# Patient Record
Sex: Female | Born: 1940
Health system: Southern US, Community
[De-identification: ages and names within clinical notes are randomized; demographics above are authoritative.]

## PROBLEM LIST (undated history)

## (undated) DIAGNOSIS — E11319 Type 2 diabetes mellitus with unspecified diabetic retinopathy without macular edema: Secondary | ICD-10-CM

## (undated) DIAGNOSIS — M8580 Other specified disorders of bone density and structure, unspecified site: Secondary | ICD-10-CM

## (undated) DIAGNOSIS — D649 Anemia, unspecified: Secondary | ICD-10-CM

## (undated) DIAGNOSIS — E1129 Type 2 diabetes mellitus with other diabetic kidney complication: Principal | ICD-10-CM

## (undated) DIAGNOSIS — R809 Proteinuria, unspecified: Principal | ICD-10-CM

## (undated) DIAGNOSIS — K219 Gastro-esophageal reflux disease without esophagitis: Secondary | ICD-10-CM

## (undated) DIAGNOSIS — E039 Hypothyroidism, unspecified: Secondary | ICD-10-CM

## (undated) HISTORY — DX: Type 2 diabetes mellitus with unspecified diabetic retinopathy without macular edema: E11.319

## (undated) HISTORY — DX: Anemia, unspecified: D64.9

## (undated) HISTORY — DX: Proteinuria, unspecified: R80.9

## (undated) HISTORY — DX: Other specified disorders of bone density and structure, unspecified site: M85.80

## (undated) HISTORY — DX: Type 2 diabetes mellitus with other diabetic kidney complication: E11.29

## (undated) HISTORY — DX: Gastro-esophageal reflux disease without esophagitis: K21.9

## (undated) HISTORY — PX: TONSILLECTOMY: SUR1361

---

## 1998-05-04 ENCOUNTER — Ambulatory Visit (HOSPITAL_COMMUNITY): Admission: RE | Admit: 1998-05-04 | Discharge: 1998-05-04 | Payer: Self-pay | Admitting: Geriatric Medicine

## 2007-02-05 ENCOUNTER — Other Ambulatory Visit: Admission: RE | Admit: 2007-02-05 | Discharge: 2007-02-05 | Payer: Self-pay | Admitting: Internal Medicine

## 2010-03-15 ENCOUNTER — Other Ambulatory Visit
Admission: RE | Admit: 2010-03-15 | Discharge: 2010-03-15 | Payer: Self-pay | Source: Home / Self Care | Admitting: Geriatric Medicine

## 2010-03-15 ENCOUNTER — Other Ambulatory Visit: Payer: Self-pay | Admitting: Geriatric Medicine

## 2010-07-07 ENCOUNTER — Emergency Department (HOSPITAL_COMMUNITY)
Admission: EM | Admit: 2010-07-07 | Discharge: 2010-07-07 | Discharge status: Against Medical Advice | Payer: Medicare PPO | Attending: Emergency Medicine | Admitting: Emergency Medicine

## 2010-07-07 DIAGNOSIS — R1013 Epigastric pain: Secondary | ICD-10-CM | POA: Insufficient documentation

## 2010-07-07 DIAGNOSIS — R112 Nausea with vomiting, unspecified: Secondary | ICD-10-CM | POA: Insufficient documentation

## 2010-07-07 DIAGNOSIS — Z0389 Encounter for observation for other suspected diseases and conditions ruled out: Secondary | ICD-10-CM | POA: Insufficient documentation

## 2010-07-23 DEATH — deceased

## 2010-08-19 ENCOUNTER — Ambulatory Visit (HOSPITAL_COMMUNITY): Admission: RE | Admit: 2010-08-19 | Payer: Self-pay | Source: Ambulatory Visit | Admitting: Gastroenterology

## 2010-08-19 ENCOUNTER — Ambulatory Visit (HOSPITAL_COMMUNITY)
Admission: RE | Admit: 2010-08-19 | Discharge: 2010-08-19 | Disposition: A | Discharge status: Home / Self Care | Payer: Medicare PPO | Source: Ambulatory Visit | Attending: Gastroenterology | Admitting: Gastroenterology

## 2010-08-19 DIAGNOSIS — K5730 Diverticulosis of large intestine without perforation or abscess without bleeding: Secondary | ICD-10-CM | POA: Insufficient documentation

## 2010-08-19 DIAGNOSIS — Z1211 Encounter for screening for malignant neoplasm of colon: Secondary | ICD-10-CM | POA: Insufficient documentation

## 2010-08-19 DIAGNOSIS — K6389 Other specified diseases of intestine: Secondary | ICD-10-CM | POA: Insufficient documentation

## 2010-09-02 NOTE — Op Note (Signed)
  NAME:  Veronica [name], Veronica [name]           ACCOUNT NO.:  [id number]  MEDICAL RECORD NO.:  [id number]  LOCATION:  WLEN                         FACILITY:  [facility]  PHYSICIAN:  Danise Edge, M.D.   DATE OF BIRTH:  [birth year]  DATE OF PROCEDURE:  08/19/2010 DATE OF DISCHARGE:                              OPERATIVE REPORT   PROCEDURE:  Baseline screening colonoscopy.  REFERRING PHYSICIAN:  Hal T. Stoneking, M.D.  HISTORY:  Veronica [name] is a 70 year old female born [birth year].  The patient is scheduled to undergo her first screening colonoscopy with polypectomy to prevent colon cancer.  ENDOSCOPIST:  Danise Edge, M.D.  PREMEDICATIONS:  Fentanyl 100 mcg, Versed 10 mg, Benadryl 50 mg.  DESCRIPTION OF PROCEDURE:  The patient was placed in the left lateral decubitus position.  Anal inspection and digital rectal exam were normal.  The Pentax pediatric colonoscope was introduced into the rectum and easily advanced to the cecum.  A normal-appearing ileocecal valve and appendiceal orifice were identified.  Colonic preparation for the exam today was good.  Rectum normal.  Retroflex view of the distal rectum normal.  Sigmoid colon.  Scattered small diverticula.  Descending colon normal.  Splenic flexure normal.  Transverse colon normal.  Hepatic flexure normal.  Ascending colon normal.  Cecum and ileocecal valve normal.  ASSESSMENT: 1. Sigmoid colonic diverticulosis. 2. Melanosis coli. 3. No endoscopic evidence for the presence of colorectal neoplasia.  RECOMMENDATIONS:  Consider repeat screening colonoscopy in 10 years if the patient's health allows.          ______________________________ Danise Edge, M.D.     MJ/MEDQ  D:  08/19/2010  T:  08/19/2010  Job:  [id number]  cc:   Hal T. Stoneking, M.D. Fax: [phone]  Electronically Signed by Danise Edge M.D. on 09/02/2010 03:56:05 PM

## 2011-02-28 HISTORY — PX: APPENDECTOMY: SHX54

## 2011-03-02 ENCOUNTER — Encounter (HOSPITAL_COMMUNITY): Payer: Self-pay | Admitting: *Deleted

## 2011-03-02 ENCOUNTER — Ambulatory Visit (HOSPITAL_COMMUNITY)
Admission: EM | Admit: 2011-03-02 | Discharge: 2011-03-04 | Disposition: A | Discharge status: Home / Self Care | Payer: Medicare HMO | Attending: Surgery | Admitting: Surgery

## 2011-03-02 ENCOUNTER — Emergency Department (HOSPITAL_COMMUNITY): Payer: Medicare HMO

## 2011-03-02 DIAGNOSIS — E039 Hypothyroidism, unspecified: Secondary | ICD-10-CM | POA: Insufficient documentation

## 2011-03-02 DIAGNOSIS — I700 Atherosclerosis of aorta: Secondary | ICD-10-CM | POA: Insufficient documentation

## 2011-03-02 DIAGNOSIS — R1031 Right lower quadrant pain: Secondary | ICD-10-CM | POA: Insufficient documentation

## 2011-03-02 DIAGNOSIS — E278 Other specified disorders of adrenal gland: Secondary | ICD-10-CM | POA: Insufficient documentation

## 2011-03-02 DIAGNOSIS — K5730 Diverticulosis of large intestine without perforation or abscess without bleeding: Secondary | ICD-10-CM | POA: Insufficient documentation

## 2011-03-02 DIAGNOSIS — Z79899 Other long term (current) drug therapy: Secondary | ICD-10-CM | POA: Insufficient documentation

## 2011-03-02 DIAGNOSIS — I079 Rheumatic tricuspid valve disease, unspecified: Secondary | ICD-10-CM | POA: Insufficient documentation

## 2011-03-02 DIAGNOSIS — K3580 Unspecified acute appendicitis: Secondary | ICD-10-CM | POA: Insufficient documentation

## 2011-03-02 DIAGNOSIS — Q619 Cystic kidney disease, unspecified: Secondary | ICD-10-CM | POA: Insufficient documentation

## 2011-03-02 DIAGNOSIS — R112 Nausea with vomiting, unspecified: Secondary | ICD-10-CM | POA: Insufficient documentation

## 2011-03-02 DIAGNOSIS — R9431 Abnormal electrocardiogram [ECG] [EKG]: Secondary | ICD-10-CM | POA: Insufficient documentation

## 2011-03-02 HISTORY — DX: Hypothyroidism, unspecified: E03.9

## 2011-03-02 LAB — DIFFERENTIAL
Basophils Absolute: 0 10*3/uL (ref 0.0–0.1)
Basophils Relative: 0 % (ref 0–1)
Eosinophils Absolute: 0 10*3/uL (ref 0.0–0.7)
Eosinophils Relative: 0 % (ref 0–5)
Lymphocytes Relative: 4 % — ABNORMAL LOW (ref 12–46)
Lymphs Abs: 0.5 10*3/uL — ABNORMAL LOW (ref 0.7–4.0)
Monocytes Absolute: 0.4 10*3/uL (ref 0.1–1.0)
Monocytes Relative: 4 % (ref 3–12)
Neutro Abs: 10.5 10*3/uL — ABNORMAL HIGH (ref 1.7–7.7)
Neutrophils Relative %: 92 % — ABNORMAL HIGH (ref 43–77)

## 2011-03-02 LAB — CBC
HCT: 30.4 % — ABNORMAL LOW (ref 36.0–46.0)
MCH: 29.7 pg (ref 26.0–34.0)
MCV: 86.9 fL (ref 78.0–100.0)
Platelets: 201 10*3/uL (ref 150–400)
RBC: 3.5 MIL/uL — ABNORMAL LOW (ref 3.87–5.11)
RDW: 12.8 % (ref 11.5–15.5)
WBC: 11.4 10*3/uL — ABNORMAL HIGH (ref 4.0–10.5)

## 2011-03-02 LAB — COMPREHENSIVE METABOLIC PANEL
CO2: 25 mEq/L (ref 19–32)
Calcium: 9.7 mg/dL (ref 8.4–10.5)
Creatinine, Ser: 0.71 mg/dL (ref 0.50–1.10)
GFR calc Af Amer: >90 mL/min (ref >90)
GFR calc non Af Amer: 84 mL/min — ABNORMAL LOW (ref >90)
Glucose, Bld: 145 mg/dL — ABNORMAL HIGH (ref 70–99)
Sodium: 134 mEq/L — ABNORMAL LOW (ref 135–145)
Total Protein: 6.7 g/dL (ref 6.0–8.3)

## 2011-03-02 LAB — LIPASE, BLOOD: Lipase: 18 U/L (ref 11–59)

## 2011-03-02 LAB — CARDIAC PANEL(CRET KIN+CKTOT+MB+TROPI)
CK, MB: 2.2 ng/mL (ref 0.3–4.0)
Relative Index: INVALID (ref 0.0–2.5)
Total CK: 72 U/L (ref 7–177)
Troponin I: <0.3 ng/mL

## 2011-03-02 MED ORDER — HYDROMORPHONE HCL PF 1 MG/ML IJ SOLN
1.0000 mg | Freq: Once | INTRAMUSCULAR | Status: AC
  Administered 2011-03-02: 1 mg via INTRAVENOUS
  Filled 2011-03-02: qty 1

## 2011-03-02 MED ORDER — SODIUM CHLORIDE 0.9 % IV SOLN
1.0000 g | Freq: Once | INTRAVENOUS | Status: AC
  Administered 2011-03-03: 1 g via INTRAVENOUS
  Filled 2011-03-02: qty 1

## 2011-03-02 MED ORDER — SODIUM CHLORIDE 0.9 % IV SOLN
Freq: Once | INTRAVENOUS | Status: AC
  Administered 2011-03-03: via INTRAVENOUS

## 2011-03-02 MED ORDER — IOHEXOL 300 MG/ML  SOLN
100.0000 mL | Freq: Once | INTRAMUSCULAR | Status: AC | PRN
  Administered 2011-03-02: 80 mL via INTRAVENOUS

## 2011-03-02 MED ORDER — SODIUM CHLORIDE 0.9 % IV BOLUS (SEPSIS)
1000.0000 mL | Freq: Once | INTRAVENOUS | Status: AC
  Administered 2011-03-02: 1000 mL via INTRAVENOUS

## 2011-03-02 MED ORDER — BUPIVACAINE-EPINEPHRINE PF 0.25-1:200000 % IJ SOLN
INTRAMUSCULAR | Status: AC
  Filled 2011-03-02: qty 30

## 2011-03-02 MED ORDER — ONDANSETRON HCL 4 MG/2ML IJ SOLN
4.0000 mg | Freq: Once | INTRAMUSCULAR | Status: AC
  Administered 2011-03-02: 4 mg via INTRAVENOUS
  Filled 2011-03-02: qty 2

## 2011-03-02 NOTE — ED Notes (Signed)
Pt reports RUQ pain that radiates down to RLQ and to LUQ. Pt also w/ multiple episodes of n/v today as well. Denies fever or diarrhea.

## 2011-03-02 NOTE — ED Provider Notes (Signed)
History     CSN: [id number]  Arrival date & time 03/02/11  5621   First MD Initiated Contact with Patient 03/02/11 2101      Chief Complaint  Patient presents with  . Abdominal Pain  . Nausea  . Emesis   Patient presents with right-sided abdominal pain. Apparently, had multiple episodes of nausea and vomiting. Today the pain began around 12 noon. The patient initially thought that she may have a "stomach flu". However, now she states the pain is localizing to the right side of the abdomen. The patient was concerned for the possibility of gallbladder disease or possibly appendicitis. She's had no fevers, no diarrhea. Denies any urinary symptoms. She did try Pepto-Bismol without any relief. Patient states she was unable to keep anything down at home. (Consider location/radiation/quality/duration/timing/severity/associated sxs/prior treatment) HPI  Past Medical History  Diagnosis Date  . Hypothyroid     Past Surgical History  Procedure Date  . Cesarean section   . Tonsillectomy     No family history on file.  History  Substance Use Topics  . Smoking status: Never Smoker   . Smokeless tobacco: Not on file  . Alcohol Use: No    OB History    Grav Para Term Preterm Abortions TAB SAB Ect Mult Living                  Review of Systems  All other systems reviewed and are negative.    Allergies  Codeine and Sulfa antibiotics  Home Medications   Current Outpatient Rx  Name Route Sig Dispense Refill  . CITALOPRAM HYDROBROMIDE 40 MG PO TABS Oral Take 40 mg by mouth daily.    [name] LEVOTHYROXINE SODIUM 75 MCG PO TABS Oral Take 75 mcg by mouth daily.      BP 151/77  Pulse 76  Temp(Src) 97.7 F (36.5 C) (Oral)  Resp 20  SpO2 100%  Physical Exam  Nursing note and vitals reviewed. Constitutional: She is oriented to person, place, and time. She appears well-developed and well-nourished.  HENT:  Head: Normocephalic and atraumatic.  Eyes: Conjunctivae and EOM are  normal. Pupils are equal, round, and reactive to light.  Neck: Neck supple.  Cardiovascular: Normal rate and regular rhythm.  Exam reveals no gallop and no friction rub.   No murmur heard. Pulmonary/Chest: Breath sounds normal. She has no wheezes. She has no rales. She exhibits no tenderness.  Abdominal: Soft. Bowel sounds are normal. She exhibits no distension. There is tenderness. There is no rebound and no guarding.       Rlq TENDERNESS  Musculoskeletal: Normal range of motion.  Neurological: She is alert and oriented to person, place, and time. No cranial nerve deficit. Coordination normal.  Skin: Skin is warm and dry. No rash noted.  Psychiatric: She has a normal mood and affect.    ED Course  Procedures (including critical care time)  Labs Reviewed - No data to display No results found.   No diagnosis found.    MDM  Pt is seen and examined;  Initial history and physical completed.  Will follow.       IV fluids, labs, pain meds, and CAT scan has been ordered. We'll also paged the surgeon, to see if he would like to see her sooner. Strong suspicion for appendicitis.    9:14 PM  Discussed with Dr. Daphine Deutscher, the general surgeon, who has evaluated the patient in the room. Based on the differential diagnosis a CT scan will be necessary.  Labs have been drawn. IV fluids pain medications and we'll follow closely. Disposition pending. CT scan    Results for orders placed during the hospital encounter of 03/02/11  CARDIAC PANEL(CRET KIN+CKTOT+MB+TROPI)      Component Value Range   Total CK 72  7 - 177 (U/L)   CK, MB 2.2  0.3 - 4.0 (ng/mL)   Troponin I <0.30  <0.30 (ng/mL)   Relative Index RELATIVE INDEX IS INVALID  0.0 - 2.5   CBC      Component Value Range   WBC 11.4 (*) 4.0 - 10.5 (K/uL)   RBC 3.50 (*) 3.87 - 5.11 (MIL/uL)   Hemoglobin 10.4 (*) 12.0 - 15.0 (g/dL)   HCT 40.9 (*) 81.1 - 46.0 (%)   MCV 86.9  78.0 - 100.0 (fL)   MCH 29.7  26.0 - 34.0 (pg)   MCHC 34.2   30.0 - 36.0 (g/dL)   RDW 91.4  78.2 - 95.6 (%)   Platelets 201  150 - 400 (K/uL)  DIFFERENTIAL      Component Value Range   Neutrophils Relative 92 (*) 43 - 77 (%)   Neutro Abs 10.5 (*) 1.7 - 7.7 (K/uL)   Lymphocytes Relative 4 (*) 12 - 46 (%)   Lymphs Abs 0.5 (*) 0.7 - 4.0 (K/uL)   Monocytes Relative 4  3 - 12 (%)   Monocytes Absolute 0.4  0.1 - 1.0 (K/uL)   Eosinophils Relative 0  0 - 5 (%)   Eosinophils Absolute 0.0  0.0 - 0.7 (K/uL)   Basophils Relative 0  0 - 1 (%)   Basophils Absolute 0.0  0.0 - 0.1 (K/uL)  COMPREHENSIVE METABOLIC PANEL      Component Value Range   Sodium 134 (*) 135 - 145 (mEq/L)   Potassium 3.8  3.5 - 5.1 (mEq/L)   Chloride 97  96 - 112 (mEq/L)   CO2 25  19 - 32 (mEq/L)   Glucose, Bld 145 (*) 70 - 99 (mg/dL)   BUN 14  6 - 23 (mg/dL)   Creatinine, Ser 2.13  0.50 - 1.10 (mg/dL)   Calcium 9.7  8.4 - 08.6 (mg/dL)   Total Protein 6.7  6.0 - 8.3 (g/dL)   Albumin 3.8  3.5 - 5.2 (g/dL)   AST 36  0 - 37 (U/L)   ALT 16  0 - 35 (U/L)   Alkaline Phosphatase 70  39 - 117 (U/L)   Total Bilirubin 0.8  0.3 - 1.2 (mg/dL)   GFR calc non Af Amer 84 (*) >90 (mL/min)   GFR calc Af Amer >90  >90 (mL/min)  LIPASE, BLOOD      Component Value Range   Lipase 18  11 - 59 (U/L)   No results found.   10:51 PM  Stable pending CT   11:30 PM  CT reviewed, Dr Daphine Deutscher re-paged.  Will need surgical care.   Matthan Sledge A. Patrica Duel, MD 03/04/11 5784

## 2011-03-03 ENCOUNTER — Encounter (HOSPITAL_COMMUNITY): Payer: Self-pay | Admitting: *Deleted

## 2011-03-03 ENCOUNTER — Other Ambulatory Visit (INDEPENDENT_AMBULATORY_CARE_PROVIDER_SITE_OTHER): Payer: Self-pay | Admitting: Surgery

## 2011-03-03 ENCOUNTER — Other Ambulatory Visit: Payer: Self-pay

## 2011-03-03 ENCOUNTER — Emergency Department (HOSPITAL_COMMUNITY): Payer: Medicare HMO | Admitting: *Deleted

## 2011-03-03 ENCOUNTER — Encounter (HOSPITAL_COMMUNITY)
Admission: EM | Disposition: A | Discharge status: Home / Self Care | Payer: Self-pay | Source: Home / Self Care | Attending: Emergency Medicine

## 2011-03-03 DIAGNOSIS — K3580 Unspecified acute appendicitis: Secondary | ICD-10-CM

## 2011-03-03 DIAGNOSIS — I369 Nonrheumatic tricuspid valve disorder, unspecified: Secondary | ICD-10-CM

## 2011-03-03 HISTORY — PX: LAPAROSCOPIC APPENDECTOMY: SHX408

## 2011-03-03 LAB — CBC
HCT: 27.9 % — ABNORMAL LOW (ref 36.0–46.0)
Hemoglobin: 9.5 g/dL — ABNORMAL LOW (ref 12.0–15.0)
MCH: 29.8 pg (ref 26.0–34.0)
MCHC: 34.1 g/dL (ref 30.0–36.0)
MCV: 87.5 fL (ref 78.0–100.0)

## 2011-03-03 LAB — CARDIAC PANEL(CRET KIN+CKTOT+MB+TROPI)
CK, MB: 2.3 ng/mL (ref 0.3–4.0)
Total CK: 67 U/L (ref 7–177)
Troponin I: <0.3 ng/mL (ref <0.30)

## 2011-03-03 LAB — CREATININE, SERUM
Creatinine, Ser: 0.79 mg/dL (ref 0.50–1.10)
GFR calc non Af Amer: 81 mL/min — ABNORMAL LOW (ref >90)

## 2011-03-03 LAB — LACTIC ACID, PLASMA: Lactic Acid, Venous: 1.1 mmol/L (ref 0.5–2.2)

## 2011-03-03 SURGERY — APPENDECTOMY, LAPAROSCOPIC
Anesthesia: General | Site: Abdomen | Wound class: Contaminated

## 2011-03-03 MED ORDER — ONDANSETRON HCL 4 MG/2ML IJ SOLN
INTRAMUSCULAR | Status: DC | PRN
  Administered 2011-03-03: 4 mg via INTRAVENOUS

## 2011-03-03 MED ORDER — HETASTARCH-ELECTROLYTES 6 % IV SOLN
INTRAVENOUS | Status: DC | PRN
  Administered 2011-03-03: 02:00:00 via INTRAVENOUS

## 2011-03-03 MED ORDER — HYDROMORPHONE HCL PF 1 MG/ML IJ SOLN
INTRAMUSCULAR | Status: AC
  Filled 2011-03-03: qty 1

## 2011-03-03 MED ORDER — GLYCOPYRROLATE 0.2 MG/ML IJ SOLN
INTRAMUSCULAR | Status: DC | PRN
  Administered 2011-03-03: .4 mg via INTRAVENOUS

## 2011-03-03 MED ORDER — EPHEDRINE SULFATE 50 MG/ML IJ SOLN
INTRAMUSCULAR | Status: DC | PRN
  Administered 2011-03-03: 10 mg via INTRAVENOUS
  Administered 2011-03-03: 5 mg via INTRAVENOUS

## 2011-03-03 MED ORDER — SUCCINYLCHOLINE CHLORIDE 20 MG/ML IJ SOLN
INTRAMUSCULAR | Status: DC | PRN
  Administered 2011-03-03: 100 mg via INTRAVENOUS

## 2011-03-03 MED ORDER — PHENYLEPHRINE HCL 10 MG/ML IJ SOLN
INTRAMUSCULAR | Status: DC | PRN
  Administered 2011-03-03: 40 ug via INTRAVENOUS

## 2011-03-03 MED ORDER — LIDOCAINE HCL (CARDIAC) 20 MG/ML IV SOLN
INTRAVENOUS | Status: DC | PRN
  Administered 2011-03-03: 100 mg via INTRAVENOUS

## 2011-03-03 MED ORDER — DEXAMETHASONE SODIUM PHOSPHATE 10 MG/ML IJ SOLN
INTRAMUSCULAR | Status: DC | PRN
  Administered 2011-03-03: 10 mg via INTRAVENOUS

## 2011-03-03 MED ORDER — MORPHINE SULFATE 2 MG/ML IJ SOLN
1.0000 mg | INTRAMUSCULAR | Status: DC | PRN
  Administered 2011-03-03: 2 mg via INTRAVENOUS
  Filled 2011-03-03 (×2): qty 1

## 2011-03-03 MED ORDER — ROCURONIUM BROMIDE 100 MG/10ML IV SOLN
INTRAVENOUS | Status: DC | PRN
  Administered 2011-03-03: 20 mg via INTRAVENOUS

## 2011-03-03 MED ORDER — FENTANYL CITRATE 0.05 MG/ML IJ SOLN
INTRAMUSCULAR | Status: DC | PRN
  Administered 2011-03-03 (×3): 50 ug via INTRAVENOUS
  Administered 2011-03-03: 100 ug via INTRAVENOUS

## 2011-03-03 MED ORDER — LEVOTHYROXINE SODIUM 75 MCG PO TABS
75.0000 ug | ORAL_TABLET | Freq: Every day | ORAL | Status: DC
  Administered 2011-03-03 – 2011-03-04 (×3): 75 ug via ORAL
  Filled 2011-03-03 (×3): qty 1

## 2011-03-03 MED ORDER — LACTATED RINGERS IV SOLN
INTRAVENOUS | Status: DC | PRN
  Administered 2011-03-03: 03:00:00
  Administered 2011-03-03: 01:00:00 via INTRAVENOUS

## 2011-03-03 MED ORDER — SODIUM CHLORIDE 0.9 % IV SOLN: Freq: Once | INTRAVENOUS | Status: DC

## 2011-03-03 MED ORDER — LACTATED RINGERS IR SOLN
Status: DC | PRN
  Administered 2011-03-03: 1000 mL

## 2011-03-03 MED ORDER — ESMOLOL HCL 10 MG/ML IV SOLN
INTRAVENOUS | Status: DC | PRN
  Administered 2011-03-03: 20 mg via INTRAVENOUS

## 2011-03-03 MED ORDER — METOCLOPRAMIDE HCL 5 MG/ML IJ SOLN
INTRAMUSCULAR | Status: DC | PRN
  Administered 2011-03-03: 10 mg via INTRAVENOUS

## 2011-03-03 MED ORDER — SODIUM CHLORIDE 0.9 % IV SOLN
INTRAVENOUS | Status: DC
  Administered 2011-03-03 (×2): via INTRAVENOUS

## 2011-03-03 MED ORDER — PROMETHAZINE HCL 25 MG/ML IJ SOLN: 6.2500 mg | INTRAMUSCULAR | Status: DC | PRN

## 2011-03-03 MED ORDER — HYDROMORPHONE HCL PF 1 MG/ML IJ SOLN
0.2500 mg | INTRAMUSCULAR | Status: DC | PRN
  Administered 2011-03-03: 0.25 mg via INTRAVENOUS
  Administered 2011-03-03: 0.5 mg via INTRAVENOUS

## 2011-03-03 MED ORDER — PROPOFOL 10 MG/ML IV BOLUS
INTRAVENOUS | Status: DC | PRN
  Administered 2011-03-03: 150 mg via INTRAVENOUS

## 2011-03-03 MED ORDER — HEPARIN SODIUM (PORCINE) 5000 UNIT/ML IJ SOLN
5000.0000 [IU] | Freq: Three times a day (TID) | INTRAMUSCULAR | Status: DC
  Administered 2011-03-03 (×2): 5000 [IU] via SUBCUTANEOUS
  Filled 2011-03-03 (×7): qty 1

## 2011-03-03 MED ORDER — NEOSTIGMINE METHYLSULFATE 1 MG/ML IJ SOLN
INTRAMUSCULAR | Status: DC | PRN
  Administered 2011-03-03: 4 mg via INTRAVENOUS

## 2011-03-03 MED ORDER — OXYCODONE-ACETAMINOPHEN 5-325 MG PO TABS
1.0000 | ORAL_TABLET | ORAL | Status: DC | PRN
  Administered 2011-03-03 (×2): 2 via ORAL
  Administered 2011-03-04 (×2): 1 via ORAL
  Filled 2011-03-03: qty 2
  Filled 2011-03-03 (×2): qty 1
  Filled 2011-03-03: qty 2

## 2011-03-03 MED ORDER — BUPIVACAINE-EPINEPHRINE 0.25% -1:200000 IJ SOLN
INTRAMUSCULAR | Status: DC | PRN
  Administered 2011-03-03: 10 mL

## 2011-03-03 MED ORDER — CITALOPRAM HYDROBROMIDE 40 MG PO TABS
40.0000 mg | ORAL_TABLET | Freq: Every day | ORAL | Status: DC
  Administered 2011-03-03: 40 mg via ORAL
  Filled 2011-03-03 (×3): qty 1

## 2011-03-03 SURGICAL SUPPLY — 43 items
APL SKNCLS STERI-STRIP NONHPOA (GAUZE/BANDAGES/DRESSINGS) ×1
APPLIER CLIP ROT 10 11.4 M/L (STAPLE)
APR CLP MED LRG 11.4X10 (STAPLE)
BAG SPEC RTRVL LRG 6X4 10 (ENDOMECHANICALS) ×1
BENZOIN TINCTURE PRP APPL 2/3 (GAUZE/BANDAGES/DRESSINGS) ×2 IMPLANT
CANISTER SUCTION 2500CC (MISCELLANEOUS) ×2 IMPLANT
CLIP APPLIE ROT 10 11.4 M/L (STAPLE) IMPLANT
CLOTH BEACON ORANGE TIMEOUT ST (SAFETY) ×2 IMPLANT
COVER SURGICAL LIGHT HANDLE (MISCELLANEOUS) ×2 IMPLANT
CUTTER FLEX LINEAR 45M (STAPLE) IMPLANT
DECANTER SPIKE VIAL GLASS SM (MISCELLANEOUS) ×1 IMPLANT
DRAPE LAPAROSCOPIC ABDOMINAL (DRAPES) ×2 IMPLANT
ELECT REM PT RETURN 9FT ADLT (ELECTROSURGICAL) ×2
ELECTRODE REM PT RTRN 9FT ADLT (ELECTROSURGICAL) ×1 IMPLANT
ENDOLOOP SUT PDS II  0 18 (SUTURE)
ENDOLOOP SUT PDS II 0 18 (SUTURE) IMPLANT
GLOVE BIOGEL M 8.0 STRL (GLOVE) ×2 IMPLANT
GLOVE BIOGEL PI IND STRL 7.0 (GLOVE) ×1 IMPLANT
GLOVE BIOGEL PI INDICATOR 7.0 (GLOVE) ×1
GOWN STRL NON-REIN LRG LVL3 (GOWN DISPOSABLE) ×2 IMPLANT
GOWN STRL REIN XL XLG (GOWN DISPOSABLE) ×4 IMPLANT
HAND ACTIVATED (MISCELLANEOUS) ×2 IMPLANT
KIT BASIN OR (CUSTOM PROCEDURE TRAY) ×2 IMPLANT
NS IRRIG 1000ML POUR BTL (IV SOLUTION) ×1 IMPLANT
PENCIL BUTTON HOLSTER BLD 10FT (ELECTRODE) IMPLANT
POUCH SPECIMEN RETRIEVAL 10MM (ENDOMECHANICALS) ×2 IMPLANT
RELOAD 45 VASCULAR/THIN (ENDOMECHANICALS) ×2 IMPLANT
RELOAD STAPLE 45 2.5 WHT GRN (ENDOMECHANICALS) IMPLANT
RELOAD STAPLE 45 3.5 BLU ETS (ENDOMECHANICALS) IMPLANT
RELOAD STAPLE TA45 3.5 REG BLU (ENDOMECHANICALS) IMPLANT
SET IRRIG TUBING LAPAROSCOPIC (IRRIGATION / IRRIGATOR) ×2 IMPLANT
SOLUTION ANTI FOG 6CC (MISCELLANEOUS) ×2 IMPLANT
SPONGE GAUZE 2X2 8PLY STRL LF (GAUZE/BANDAGES/DRESSINGS) ×1 IMPLANT
STRIP CLOSURE SKIN 1/2X4 (GAUZE/BANDAGES/DRESSINGS) ×2 IMPLANT
SUT VIC AB 4-0 SH 18 (SUTURE) ×2 IMPLANT
SYR 30ML LL (SYRINGE) ×2 IMPLANT
TAPE CLOTH SURG 4X10 WHT LF (GAUZE/BANDAGES/DRESSINGS) ×1 IMPLANT
TRAY FOLEY CATH 14FRSI W/METER (CATHETERS) ×2 IMPLANT
TRAY LAP CHOLE (CUSTOM PROCEDURE TRAY) ×2 IMPLANT
TROCAR XCEL BLUNT TIP 100MML (ENDOMECHANICALS) ×2 IMPLANT
TROCAR XCEL NON-BLD 11X100MML (ENDOMECHANICALS) ×2 IMPLANT
TROCAR XCEL NON-BLD 5MMX100MML (ENDOMECHANICALS) ×1 IMPLANT
TUBING INSUFFLATION 10FT LAP (TUBING) ×2 IMPLANT

## 2011-03-03 NOTE — Progress Notes (Signed)
*  PRELIMINARY RESULTS* Echocardiogram 2D Echocardiogram has been performed.  Veronica [name] [facility] 03/03/2011, 2:20 PM

## 2011-03-03 NOTE — Consult Note (Signed)
Date of Admission:  03/02/2011  Date of Consult:  03/03/2011  Reason for Consult: Abnormal EKG. Referring Physician: General surgery-Dr. Alvera [name] Veronica [name] is an 71 y.o. female.  HPI: Patient is Veronica 71 year old Caucasian female with history of hypothyroidism, had laparoscopic appendectomy done which was uneventful. Patient was said to have done well peri and postoperatively. She was however observed to have abnormal EKG i.e. questionable ST depression in the inferior leads. Patient however denied any history of chest pain. She denied any shortness of breath. She denies any palpitations. She also denies any systemic symptoms. Patient is being evaluated to rule out cardiac dysrhythmia.  Past Medical History  Diagnosis Date  . Hypothyroid     Medications: I have reviewed the patient's current medications.  Allergies:  Allergies  Allergen Reactions  . Codeine Hives and Nausea And Vomiting  . Sulfa Antibiotics Hives and Nausea And Vomiting    Social History:  reports that she has never smoked. She does not have any smokeless tobacco history on file. She reports that she does not drink alcohol or use illicit drugs.  No family history on file.  Past Surgical History  Procedure Date  . Cesarean section   . Tonsillectomy     Review of Systems: The patient denies anorexia, fever, weight loss,, vision loss, decreased hearing, hoarseness, chest pain, syncope, dyspnea on exertion, peripheral edema, balance deficits, hemoptysis, abdominal pain, melena, hematochezia, severe indigestion/heartburn, hematuria, incontinence, genital sores, muscle weakness, suspicious skin lesions, transient blindness, difficulty walking, depression, unusual weight change, abnormal bleeding, enlarged lymph nodes, angioedema, and breast masses. Complain of mild discomfort at surgical site.  Blood pressure 101/48, pulse 80, temperature 97.9 F (36.6 C), temperature source Oral, resp. rate 12, SpO2  97.00%.  Physical Exam: Vitals as above. HEENT-no pedal  Neck-no JVD Chest-clear to auscultation  CVS-S1 and S2 no murmurs Abdomen-soft, slight tenderness at op site, organs not palpable, bowel sounds hypoactive. Extremity-no pedal edema Skin-slightly decreased turgor Neuro-non focal Neuropsych-appropriate affect.     Results for orders placed during the hospital encounter of 03/02/11 (from the past 48 hour(s))  LACTIC ACID, PLASMA     Status: Normal   Collection Time   03/02/11 12:55 AM      Component Value Range Comment   Lactic Acid, Venous 1.1  0.5 - 2.2 (mmol/L)   CARDIAC PANEL(CRET KIN+CKTOT+MB+TROPI)     Status: Normal   Collection Time   03/02/11  9:38 PM      Component Value Range Comment   Total CK 72  7 - 177 (U/L)    CK, MB 2.2  0.3 - 4.0 (ng/mL)    Troponin I <0.30  <0.30 (ng/mL)    Relative Index RELATIVE INDEX IS INVALID  0.0 - 2.5    CBC     Status: Abnormal   Collection Time   03/02/11  9:38 PM      Component Value Range Comment   WBC 11.4 (*) 4.0 - 10.5 (K/uL)    RBC 3.50 (*) 3.87 - 5.11 (MIL/uL)    Hemoglobin 10.4 (*) 12.0 - 15.0 (g/dL)    HCT 86.5 (*) 78.4 - 46.0 (%)    MCV 86.9  78.0 - 100.0 (fL)    MCH 29.7  26.0 - 34.0 (pg)    MCHC 34.2  30.0 - 36.0 (g/dL)    RDW 69.6  29.5 - 28.4 (%)    Platelets 201  150 - 400 (K/uL)   DIFFERENTIAL     Status: Abnormal  Collection Time   03/02/11  9:38 PM      Component Value Range Comment   Neutrophils Relative 92 (*) 43 - 77 (%)    Neutro Abs 10.5 (*) 1.7 - 7.7 (K/uL)    Lymphocytes Relative 4 (*) 12 - 46 (%)    Lymphs Abs 0.5 (*) 0.7 - 4.0 (K/uL)    Monocytes Relative 4  3 - 12 (%)    Monocytes Absolute 0.4  0.1 - 1.0 (K/uL)    Eosinophils Relative 0  0 - 5 (%)    Eosinophils Absolute 0.0  0.0 - 0.7 (K/uL)    Basophils Relative 0  0 - 1 (%)    Basophils Absolute 0.0  0.0 - 0.1 (K/uL)   COMPREHENSIVE METABOLIC PANEL     Status: Abnormal   Collection Time   03/02/11  9:38 PM      Component Value Range  Comment   Sodium 134 (*) 135 - 145 (mEq/L)    Potassium 3.8  3.5 - 5.1 (mEq/L) SLIGHT HEMOLYSIS   Chloride 97  96 - 112 (mEq/L)    CO2 25  19 - 32 (mEq/L)    Glucose, Bld 145 (*) 70 - 99 (mg/dL)    BUN 14  6 - 23 (mg/dL)    Creatinine, Ser 3.32  0.50 - 1.10 (mg/dL)    Calcium 9.7  8.4 - 10.5 (mg/dL)    Total Protein 6.7  6.0 - 8.3 (g/dL)    Albumin 3.8  3.5 - 5.2 (g/dL)    AST 36  0 - 37 (U/L) SLIGHT HEMOLYSIS   ALT 16  0 - 35 (U/L)    Alkaline Phosphatase 70  39 - 117 (U/L)    Total Bilirubin 0.8  0.3 - 1.2 (mg/dL)    GFR calc non Af Amer 84 (*) >90 (mL/min)    GFR calc Af Amer >90  >90 (mL/min)   LIPASE, BLOOD     Status: Normal   Collection Time   03/02/11  9:38 PM      Component Value Range Comment   Lipase 18  11 - 59 (U/L)   CBC     Status: Abnormal   Collection Time   03/03/11  4:09 AM      Component Value Range Comment   WBC 10.5  4.0 - 10.5 (K/uL)    RBC 3.19 (*) 3.87 - 5.11 (MIL/uL)    Hemoglobin 9.5 (*) 12.0 - 15.0 (g/dL)    HCT 95.1 (*) 88.4 - 46.0 (%)    MCV 87.5  78.0 - 100.0 (fL)    MCH 29.8  26.0 - 34.0 (pg)    MCHC 34.1  30.0 - 36.0 (g/dL)    RDW 16.6  06.3 - 01.6 (%)    Platelets 158  150 - 400 (K/uL)   CARDIAC PANEL(CRET KIN+CKTOT+MB+TROPI)     Status: Normal   Collection Time   03/03/11  4:09 AM      Component Value Range Comment   Total CK 58  7 - 177 (U/L)    CK, MB 2.0  0.3 - 4.0 (ng/mL)    Troponin I <0.30  <0.30 (ng/mL)    Relative Index RELATIVE INDEX IS INVALID  0.0 - 2.5    CREATININE, SERUM     Status: Abnormal   Collection Time   03/03/11  4:09 AM      Component Value Range Comment   Creatinine, Ser 0.79  0.50 - 1.10 (mg/dL)    GFR calc non  Af Amer 81 (*) >90 (mL/min)    GFR calc Af Amer >90  >90 (mL/min)     Ct Abdomen Pelvis W Contrast  03/02/2011  *RADIOLOGY REPORT*  Clinical Data: Lower quadrant pain.  CT ABDOMEN AND PELVIS WITH CONTRAST  Technique:  Multidetector CT imaging of the abdomen and pelvis was performed following the standard  protocol during bolus administration of intravenous contrast.  Contrast: 80mL OMNIPAQUE IOHEXOL 300 MG/ML IV SOLN  Comparison: None.  Findings: Dependent atelectasis at the lung bases.  The liver appears within normal limits.  Spleen normal.  Patulous gastroesophageal junction with oral contrast in the distal esophagus.  Bilateral renal low density lesions are present, bilateral simple renal cysts are present.  Some of the lesions are too small to characterize but statistically likely represents cysts.  Largest cystic lesion is in the left inferior renal pole measuring 26 mm. Abdominal atherosclerosis is present.  Exophytic cyst extends off the left upper renal pole, measuring 29 mm.  15 mm left adrenal nodule is present incompletely characterized but statistically likely representing adenoma.  Small bowel appears within normal limits.  Tiny fat containing umbilical hernia. Gallbladder appears normal.  No calcified gallstones.  Pancreas within normal limits.  Aortoiliofemoral atherosclerosis without aneurysm.  There is Veronica dilated tubular structure in the right lower quadrant compatible with acute appendicitis.  No perforation or abscess.  Severe colonic diverticulosis.  Uterus and adnexa appear normal.  Urinary bladder normal.  Scattered phleboliths in the anatomic pelvis.  The bones appear within normal limits.  L4-L5 predominant lumbar spondylosis. Mild mural thickening of the splenic flexure of the colon is probably due to under distention.  If colonoscopy has not been performed recently, it is recommended.  IMPRESSION: 1.  Acute appendicitis.  No perforation or abscess. 2.  Bilateral renal cysts. 3.  15 mm left adrenal nodule, statistically likely adenoma but inadequately evaluated. 4.  Atherosclerosis. 5.  Thickening of the splenic flexure of the colon may be due to under distention.  Follow-up colonoscopy is recommended if not recently performed.  Original Report Authenticated By: Andreas Newport, M.D.    Problems: #1 abnormal EKG. #2 status post laparoscopic appendectomy #3 dehydration #4 hypothyroidism.  Impression: #1 abnormal EKG-when EKG was was reviewed by me, there was no ST depression rather was Veronica repolarization of QRS segment. This is Veronica normal variant. Patient is currently asymptomatic. However, we order cardiac enzymes and 2-D echo. If these are unremarkable, surgery can discharge patient.  #2 status post laparoscopic appendectomy-management as per surgery.  #3 dehydration-patient is currently receiving IV hydration  #4 hypothyroidism-continue Synthroid  If enzymes are negative and 2-D echo is unremarkable, surgery consider discharge of patient. Thank you for allowing me to participate in the management of this patient    Leva Baine 03/03/2011, 8:31 AM

## 2011-03-03 NOTE — Transfer of Care (Signed)
Immediate Anesthesia Transfer of Care Note  Patient: Veronica [name]  Procedure(s) Performed:  APPENDECTOMY LAPAROSCOPIC  Patient Location: PACU  Anesthesia Type: General  Level of Consciousness: awake, alert , oriented and patient cooperative  Airway & Oxygen Therapy: Patient Spontanous Breathing and Patient connected to face mask oxygen  Post-op Assessment: Report given to PACU RN, Post -op Vital signs reviewed and stable and Patient moving all extremities  Post vital signs: Reviewed and stable  Complications: No apparent anesthesia complications

## 2011-03-03 NOTE — Progress Notes (Signed)
Will Poydras PA in to see.

## 2011-03-03 NOTE — Op Note (Signed)
Surgeon: Wenda Low, MD, FACS  Asst:  None  Anes:  General  Procedure: Laparoscopic appendectomy  Diagnosis: Acute appendicitis  Complications: None  EBL:   Minimal cc  Description of Procedure:  The patient was taken to or 1 on Thursday, 03/03/2011 at approximately 1 AM. Preoperatively she received 1 g of Invanz. After being placed asleep a timeout was performed. The patient was prepped with PCMX and draped. A Foley was in place. Longitudinal incision was made into the umbilicus and the Hassan cannula was inserted without difficulty. The abdomen was insufflated and a 5 mm was placed in the right upper quadrant and a 1011 placed obliquely in the left lower quadrant. The appendix was exuded with purulence but was not ruptured. I was able to transect the mesentery with the harmonic scalpel and isolate the base. The appendix was then removed with a single application of the Endo GIA with a white cartridge. Hemostasis was present. The appendix was placed in a bag and brought out through the umbilicus. The umbilical incision was repaired with a figure-of-eight suture 0 Vicryl. All port sites were injected with lidocaine and closed with 4-0 Vicryl benzoin and Steri-Strips. Patient seemed to tolerate the procedure well and was taken to the recovery room in satisfactory condition.  Matt B. Daphine Deutscher, MD, [facility], [place] [phone]

## 2011-03-03 NOTE — Preoperative (Signed)
Beta Blockers   Reason not to administer Beta Blockers:Not Applicable 

## 2011-03-03 NOTE — Progress Notes (Signed)
Try to get to recover from surgery IM eval for possible cardiac issues

## 2011-03-03 NOTE — Progress Notes (Signed)
FDA warning: recommended maximum dose of Celexa(citalopram) in patients > 73 y.o. is 20mg /day.  Higher doses increase the risk of QTc prolongation and life-threatening arrhythmias.  This patient is on their home dose of Celexa 40mg  daily.  Please consider changing/reducing to 20mg  daily now and consider this at discharge.  Thank you,  Charolotte Eke, PharmD, pager [phone]. 03/03/2011,11:25 AM.

## 2011-03-03 NOTE — Progress Notes (Signed)
Walked to bathroom in holding area to void large quantity of clear yellow urine. Gait steady. Tolerated ambulation without n/v or dizziness.

## 2011-03-03 NOTE — Anesthesia Preprocedure Evaluation (Addendum)
Anesthesia Evaluation  Patient identified by MRN, date of birth, ID band Patient awake  General Assessment Comment:CT scan contrast 22:00, otherwise NPO  Reviewed: Allergy & Precautions, H&P , NPO status , Patient's Chart, lab work & pertinent test results, reviewed documented beta blocker date and time   Airway Mallampati: II TM Distance: >3 FB Neck ROM: Full    Dental  (+) Dental Advisory Given   Pulmonary  Allergies clear to auscultation        Cardiovascular neg cardio ROS Regular Normal Denies cardiac symptoms   Neuro/Psych Negative Neurological ROS  Negative Psych ROS   GI/Hepatic negative GI ROS, Neg liver ROS,   Endo/Other  Hypothyroidism On replacement  Renal/GU negative Renal ROS  Genitourinary negative   Musculoskeletal negative musculoskeletal ROS (+)   Abdominal   Peds negative pediatric ROS (+)  Hematology Anemia Hgb 10.4   Anesthesia Other Findings Upper front veneers  Reproductive/Obstetrics negative OB ROS                           Anesthesia Physical Anesthesia Plan  ASA: II and Emergent  Anesthesia Plan: General   Post-op Pain Management:    Induction: Intravenous, Rapid sequence and Cricoid pressure planned  Airway Management Planned: Oral ETT  Additional Equipment:   Intra-op Plan:   Post-operative Plan: Extubation in OR  Informed Consent:   Dental advisory given  Plan Discussed with: CRNA and Surgeon  Anesthesia Plan Comments:        Anesthesia Quick Evaluation

## 2011-03-03 NOTE — Progress Notes (Signed)
Transferred with telemetry and bag of clothes taken to room.

## 2011-03-03 NOTE — H&P (Signed)
Chief Complaint:  Onset RLQ pain and nausea, vomiting today  History of Present Illness:  Veronica [name] is an 71 y.o. female patient of Dr. Merlene Laughter who presented to the ER earlier last evening with the above complaints.  I saw her with Dr. Lillard Anes prior to her CT.  Informed consent subsequently obtained regarding appendectomy.    Past Medical History  Diagnosis Date  . Hypothyroid     Past Surgical History  Procedure Date  . Cesarean section   . Tonsillectomy     Current Facility-Administered Medications  Medication Dose Route Frequency Provider Last Rate Last Dose  . 0.9 %  sodium chloride infusion   Intravenous Once Peter A. Patrica Duel, MD 125 mL/hr at 03/03/11 0012    . ertapenem (INVANZ) 1 g in sodium chloride 0.9 % 50 mL IVPB  1 g Intravenous Once Peter A. Patrica Duel, MD   1 g at 03/03/11 0013  . HYDROmorphone (DILAUDID) injection 1 mg  1 mg Intravenous Once Peter A. Patrica Duel, MD   1 mg at 03/02/11 2132  . HYDROmorphone (DILAUDID) injection 1 mg  1 mg Intravenous Once Peter A. Patrica Duel, MD   1 mg at 03/02/11 2334  . iohexol (OMNIPAQUE) 300 MG/ML solution 100 mL  100 mL Intravenous Once PRN Medication Radiologist   80 mL at 03/02/11 2311  . ondansetron (ZOFRAN) injection 4 mg  4 mg Intravenous Once Peter A. Patrica Duel, MD   4 mg at 03/02/11 2133  . sodium chloride 0.9 % bolus 1,000 mL  1,000 mL Intravenous Once Peter A. Patrica Duel, MD   1,000 mL at 03/02/11 2133   Current Outpatient Prescriptions  Medication Sig Dispense Refill  . citalopram (CELEXA) 40 MG tablet Take 40 mg by mouth daily.      [name] levothyroxine (SYNTHROID, LEVOTHROID) 75 MCG tablet Take 75 mcg by mouth daily.       Codeine and Sulfa antibiotics No family history on file. Social History:   reports that she has never smoked. She does not have any smokeless tobacco history on file. She reports that she does not drink alcohol or use illicit drugs.   REVIEW OF SYSTEMS - PERTINENT POSITIVES ONLY: No history of DVT.  Prior  surgery includes C section and BTL  Physical Exam:   Blood pressure 112/42, pulse 91, temperature 100.3 F (37.9 C), temperature source Oral, resp. rate 19, SpO2 94.00%. There is no height or weight on file to calculate BMI.  Gen:  WDWN W female in  NAD  Neurological: Alert and oriented to person, place, and time. Motor and sensory function is grossly intact  Head: Normocephalic and atraumatic.  Eyes: Conjunctivae are normal. Pupils are equal, round, and reactive to light. No scleral icterus.  Neck: Normal range of motion. Neck supple. No tracheal deviation or thyromegaly present.  Cardiovascular:  SR without murmurs or gallops.  No carotid bruits Respiratory: Effort normal.  No respiratory distress. No chest wall tenderness. Breath sounds normal.  No wheezes, rales or rhonchi.  Abdomen:  Tender in the RLQ.  BS quiet GU: Musculoskeletal: Normal range of motion. Extremities are nontender. No cyanosis, edema or clubbing noted Lymphadenopathy: No cervical, preauricular, postauricular or axillary adenopathy is present Skin: Skin is warm and dry. No rash noted. No diaphoresis. No erythema. No pallor. Pscyh: Normal mood and affect. Behavior is normal. Judgment and thought content normal.   LABORATORY RESULTS: Results for orders placed during the hospital encounter of 03/02/11 (from the past 48 hour(s))  CARDIAC PANEL(CRET KIN+CKTOT+MB+TROPI)  Status: Normal   Collection Time   03/02/11  9:38 PM      Component Value Range Comment   Total CK 72  7 - 177 (U/L)    CK, MB 2.2  0.3 - 4.0 (ng/mL)    Troponin I <0.30  <0.30 (ng/mL)    Relative Index RELATIVE INDEX IS INVALID  0.0 - 2.5    CBC     Status: Abnormal   Collection Time   03/02/11  9:38 PM      Component Value Range Comment   WBC 11.4 (*) 4.0 - 10.5 (K/uL)    RBC 3.50 (*) 3.87 - 5.11 (MIL/uL)    Hemoglobin 10.4 (*) 12.0 - 15.0 (g/dL)    HCT 41.3 (*) 24.4 - 46.0 (%)    MCV 86.9  78.0 - 100.0 (fL)    MCH 29.7  26.0 - 34.0 (pg)     MCHC 34.2  30.0 - 36.0 (g/dL)    RDW 01.0  27.2 - 53.6 (%)    Platelets 201  150 - 400 (K/uL)   DIFFERENTIAL     Status: Abnormal   Collection Time   03/02/11  9:38 PM      Component Value Range Comment   Neutrophils Relative 92 (*) 43 - 77 (%)    Neutro Abs 10.5 (*) 1.7 - 7.7 (K/uL)    Lymphocytes Relative 4 (*) 12 - 46 (%)    Lymphs Abs 0.5 (*) 0.7 - 4.0 (K/uL)    Monocytes Relative 4  3 - 12 (%)    Monocytes Absolute 0.4  0.1 - 1.0 (K/uL)    Eosinophils Relative 0  0 - 5 (%)    Eosinophils Absolute 0.0  0.0 - 0.7 (K/uL)    Basophils Relative 0  0 - 1 (%)    Basophils Absolute 0.0  0.0 - 0.1 (K/uL)   COMPREHENSIVE METABOLIC PANEL     Status: Abnormal   Collection Time   03/02/11  9:38 PM      Component Value Range Comment   Sodium 134 (*) 135 - 145 (mEq/L)    Potassium 3.8  3.5 - 5.1 (mEq/L) SLIGHT HEMOLYSIS   Chloride 97  96 - 112 (mEq/L)    CO2 25  19 - 32 (mEq/L)    Glucose, Bld 145 (*) 70 - 99 (mg/dL)    BUN 14  6 - 23 (mg/dL)    Creatinine, Ser 6.44  0.50 - 1.10 (mg/dL)    Calcium 9.7  8.4 - 10.5 (mg/dL)    Total Protein 6.7  6.0 - 8.3 (g/dL)    Albumin 3.8  3.5 - 5.2 (g/dL)    AST 36  0 - 37 (U/L) SLIGHT HEMOLYSIS   ALT 16  0 - 35 (U/L)    Alkaline Phosphatase 70  39 - 117 (U/L)    Total Bilirubin 0.8  0.3 - 1.2 (mg/dL)    GFR calc non Af Amer 84 (*) >90 (mL/min)    GFR calc Af Amer >90  >90 (mL/min)   LIPASE, BLOOD     Status: Normal   Collection Time   03/02/11  9:38 PM      Component Value Range Comment   Lipase 18  11 - 59 (U/L)     RADIOLOGY RESULTS: Ct Abdomen Pelvis W Contrast  03/02/2011  *RADIOLOGY REPORT*  Clinical Data: Lower quadrant pain.  CT ABDOMEN AND PELVIS WITH CONTRAST  Technique:  Multidetector CT imaging of the abdomen and pelvis was performed following  the standard protocol during bolus administration of intravenous contrast.  Contrast: 80mL OMNIPAQUE IOHEXOL 300 MG/ML IV SOLN  Comparison: None.  Findings: Dependent atelectasis at the lung bases.   The liver appears within normal limits.  Spleen normal.  Patulous gastroesophageal junction with oral contrast in the distal esophagus.  Bilateral renal low density lesions are present, bilateral simple renal cysts are present.  Some of the lesions are too small to characterize but statistically likely represents cysts.  Largest cystic lesion is in the left inferior renal pole measuring 26 mm. Abdominal atherosclerosis is present.  Exophytic cyst extends off the left upper renal pole, measuring 29 mm.  15 mm left adrenal nodule is present incompletely characterized but statistically likely representing adenoma.  Small bowel appears within normal limits.  Tiny fat containing umbilical hernia. Gallbladder appears normal.  No calcified gallstones.  Pancreas within normal limits.  Aortoiliofemoral atherosclerosis without aneurysm.  There is a dilated tubular structure in the right lower quadrant compatible with acute appendicitis.  No perforation or abscess.  Severe colonic diverticulosis.  Uterus and adnexa appear normal.  Urinary bladder normal.  Scattered phleboliths in the anatomic pelvis.  The bones appear within normal limits.  L4-L5 predominant lumbar spondylosis. Mild mural thickening of the splenic flexure of the colon is probably due to under distention.  If colonoscopy has not been performed recently, it is recommended.  IMPRESSION: 1.  Acute appendicitis.  No perforation or abscess. 2.  Bilateral renal cysts. 3.  15 mm left adrenal nodule, statistically likely adenoma but inadequately evaluated. 4.  Atherosclerosis. 5.  Thickening of the splenic flexure of the colon may be due to under distention.  Follow-up colonoscopy is recommended if not recently performed.  Original Report Authenticated By: Andreas Newport, M.D.    Problem List: There is no problem list on file for this patient.   Assessment & Plan: Acute appendicitis.  IV Invanz given in ER.  Plan laparoscopic appendectomy    Matt B. Daphine Deutscher,  MD, [facility] [phone] beeper [phone]  03/03/2011 1:05 AM

## 2011-03-03 NOTE — Progress Notes (Signed)
Dr Lonia Blood informed of dr Marcello Fennel desire to have hopitalist see pt. Orders for cardiac panel ordered by dr. Daphine Deutscher

## 2011-03-03 NOTE — Progress Notes (Signed)
Day of Surgery  Subjective: Pt is awake and alert in PACU.  Somewhat sore, but looks quite good.  No  Prior hx of chest pain or cardiac issues.    Objective: Vital signs in last 24 hours: Temp:  [97.7 F (36.5 C)-100.3 F (37.9 C)] 98.3 F (36.8 C) (01/10 0600) Pulse Rate:  [76-98] 87  (01/10 0730) Resp:  [11-21] 19  (01/10 0730) BP: (94-151)/(42-77) 94/52 mmHg (01/10 0730) SpO2:  [94 %-100 %] 99 % (01/10 0730)    Intake/Output from previous day: 01/09 0701 - 01/10 0700 In: 1850 [I.V.:1350; IV Piggyback:500] Out: 250 [Urine:200; Blood:50] Intake/Output this shift:    PE:  Alert, NAD, SR on monitor in ICU,  Cardiac:  No murmur or rub, Norm s1s2.  Chest clear, Abd; Sl tender, +BS, no distension.  Walked to BR with no significant assist. No distension Lab Results:   Basename 03/03/11 0409 03/02/11 2138  WBC 10.5 11.4*  HGB 9.5* 10.4*  HCT 27.9* 30.4*  PLT 158 201    BMET  Basename 03/02/11 2138  NA 134*  K 3.8  CL 97  CO2 25  GLUCOSE 145*  BUN 14  CREATININE 0.71  CALCIUM 9.7   PT/INR No results found for this basename: LABPROT:2,INR:2 in the last 72 hours   Studies/Results: Ct Abdomen Pelvis W Contrast  03/02/2011  *RADIOLOGY REPORT*  Clinical Data: Lower quadrant pain.  CT ABDOMEN AND PELVIS WITH CONTRAST  Technique:  Multidetector CT imaging of the abdomen and pelvis was performed following the standard protocol during bolus administration of intravenous contrast.  Contrast: 80mL OMNIPAQUE IOHEXOL 300 MG/ML IV SOLN  Comparison: None.  Findings: Dependent atelectasis at the lung bases.  The liver appears within normal limits.  Spleen normal.  Patulous gastroesophageal junction with oral contrast in the distal esophagus.  Bilateral renal low density lesions are present, bilateral simple renal cysts are present.  Some of the lesions are too small to characterize but statistically likely represents cysts.  Largest cystic lesion is in the left inferior renal pole  measuring 26 mm. Abdominal atherosclerosis is present.  Exophytic cyst extends off the left upper renal pole, measuring 29 mm.  15 mm left adrenal nodule is present incompletely characterized but statistically likely representing adenoma.  Small bowel appears within normal limits.  Tiny fat containing umbilical hernia. Gallbladder appears normal.  No calcified gallstones.  Pancreas within normal limits.  Aortoiliofemoral atherosclerosis without aneurysm.  There is a dilated tubular structure in the right lower quadrant compatible with acute appendicitis.  No perforation or abscess.  Severe colonic diverticulosis.  Uterus and adnexa appear normal.  Urinary bladder normal.  Scattered phleboliths in the anatomic pelvis.  The bones appear within normal limits.  L4-L5 predominant lumbar spondylosis. Mild mural thickening of the splenic flexure of the colon is probably due to under distention.  If colonoscopy has not been performed recently, it is recommended.  IMPRESSION: 1.  Acute appendicitis.  No perforation or abscess. 2.  Bilateral renal cysts. 3.  15 mm left adrenal nodule, statistically likely adenoma but inadequately evaluated. 4.  Atherosclerosis. 5.  Thickening of the splenic flexure of the colon may be due to under distention.  Follow-up colonoscopy is recommended if not recently performed.  Original Report Authenticated By: Andreas Newport, M.D.    Anti-infectives: Anti-infectives     Start     Dose/Rate Route Frequency Ordered Stop   03/02/11 2345   ertapenem (INVANZ) 1 g in sodium chloride 0.9 % 50 mL IVPB  1 g 100 mL/hr over 30 Minutes Intravenous  Once 03/02/11 2342 03/03/11 0043         Current Facility-Administered Medications  Medication Dose Route Frequency Provider Last Rate Last Dose  . 0.9 %  sodium chloride infusion   Intravenous Once Peter A. Patrica Duel, MD 125 mL/hr at 03/03/11 0012    . citalopram (CELEXA) tablet 40 mg  40 mg Oral Daily Valarie Merino, MD      . ertapenem  [facility]) 1 g in sodium chloride 0.9 % 50 mL IVPB  1 g Intravenous Once Peter A. Tucich, MD   1 g at 03/03/11 0013  . heparin injection 5,000 Units  5,000 Units Subcutaneous Q8H Valarie Merino, MD   5,000 Units at 03/03/11 4098  . HYDROmorphone (DILAUDID) 1 MG/ML injection           . HYDROmorphone (DILAUDID) injection 0.25-0.5 mg  0.25-0.5 mg Intravenous Q5 min PRN Jill Side, MD   0.5 mg at 03/03/11 0533  . HYDROmorphone (DILAUDID) injection 1 mg  1 mg Intravenous Once Peter A. Patrica Duel, MD   1 mg at 03/02/11 2132  . HYDROmorphone (DILAUDID) injection 1 mg  1 mg Intravenous Once Peter A. Patrica Duel, MD   1 mg at 03/02/11 2334  . iohexol (OMNIPAQUE) 300 MG/ML solution 100 mL  100 mL Intravenous Once PRN Medication Radiologist   80 mL at 03/02/11 2311  . levothyroxine (SYNTHROID, LEVOTHROID) tablet 75 mcg  75 mcg Oral QAC breakfast Valarie Merino, MD      . ondansetron [facility]) injection 4 mg  4 mg Intravenous Once Peter A. Patrica Duel, MD   4 mg at 03/02/11 2133  . promethazine (PHENERGAN) injection 6.25-12.5 mg  6.25-12.5 mg Intravenous Q15 min PRN Jill Side, MD      . sodium chloride 0.9 % bolus 1,000 mL  1,000 mL Intravenous Once Peter A. Patrica Duel, MD   1,000 mL at 03/02/11 2133  . DISCONTD: bupivacaine-EPINEPHrine (MARCAINE W/ EPI) 0.25 % (with pres) injection    PRN Valarie Merino, MD   10 mL at 03/03/11 0201  . DISCONTD: lactated ringers irrigation solution    PRN Valarie Merino, MD   1,000 mL at 03/03/11 0106   Facility-Administered Medications Ordered in Other Encounters  Medication Dose Route Frequency Provider Last Rate Last Dose  . DISCONTD: dexamethasone (DECADRON) injection    PRN Lattie Haw   10 mg at 03/03/11 0205  . DISCONTD: ePHEDrine injection    PRN Lattie Haw   10 mg at 03/03/11 0130  . DISCONTD: esmolol (BREVIBLOC) injection    PRN Lattie Haw   20 mg at 03/03/11 0210  . DISCONTD: fentaNYL (SUBLIMAZE) injection     PRN Lattie Haw   50 mcg at 03/03/11 0225  . DISCONTD: glycopyrrolate (ROBINUL) injection    PRN Lattie Haw   0.4 mg at 03/03/11 0203  . DISCONTD: hetastarch in lactated electrolyte (HEXTEND) 6 % infusion    Continuous PRN Lattie Haw      . DISCONTD: lactated ringers infusion    Continuous PRN Lattie Haw      . DISCONTD: lidocaine (cardiac) 100 mg/54ml (XYLOCAINE) 20 MG/ML injection 2%    PRN Lattie Haw   100 mg at 03/03/11 0135  . DISCONTD: metoCLOPramide (REGLAN) injection    PRN Lattie Haw   10 mg at 03/03/11 0113  . DISCONTD:  neostigmine (PROSTIGMINE) injection   Intravenous PRN Lattie Haw   4 mg at 03/03/11 0203  . DISCONTD: ondansetron (ZOFRAN) injection    PRN Lattie Haw   4 mg at 03/03/11 0205  . DISCONTD: phenylephrine (NEO-SYNEPHRINE) injection    PRN Lattie Haw   40 mcg at 03/03/11 0141  . DISCONTD: propofol (DIPRIVAN) 10 mg/mL bolus    PRN Lattie Haw   150 mg at 03/03/11 0122  . DISCONTD: rocuronium (ZEMURON) injection    PRN Lattie Haw   20 mg at 03/03/11 0133  . DISCONTD: succinylcholine (ANECTINE) injection    PRN Lattie Haw   100 mg at 03/03/11 0122    Assessment/Plan  Acute Appendicitis Hypothyroid Hx Depression on SSRI Intraoperative ST changes on EKG strip.  Plan: Doing well from appendectomy.  Will ask Medicine to evaluate for EKG changes.  She exercises regularly without any hx of cardiac issues. Will transfer to telem, ask Medicine to see, repeat cardiac panel @ 10AM.  Last was done at 4AM.    LOS: 1 day    Yoshimi Sarr 03/03/2011

## 2011-03-03 NOTE — Anesthesia Procedure Notes (Signed)
Procedure Name: Intubation Date/Time: 03/03/2011 1:22 AM Performed by: Randon Goldsmith Veronica [name] Pre-anesthesia Checklist: Patient identified, Emergency Drugs available, Suction available and Patient being monitored Patient Re-evaluated:Patient Re-evaluated prior to inductionOxygen Delivery Method: Circle System Utilized Preoxygenation: Pre-oxygenation with 100% oxygen Intubation Type: IV induction, Cricoid Pressure applied and Rapid sequence Laryngoscope Size: Miller and 3 Grade View: Grade II Tube type: Oral Tube size: 7.5 mm Number of attempts: 2 Airway Equipment and Method: stylet Placement Confirmation: ETT inserted through vocal cords under direct vision,  positive ETCO2,  CO2 detector and breath sounds checked- equal and bilateral Secured at: 21 cm Tube secured with: Tape Dental Injury: Teeth and Oropharynx as per pre-operative assessment

## 2011-03-03 NOTE — ED Notes (Signed)
Taking patient to or

## 2011-03-04 MED ORDER — OXYCODONE-ACETAMINOPHEN 5-325 MG PO TABS: 1.0000 | ORAL_TABLET | ORAL | Status: AC | PRN

## 2011-03-04 NOTE — Progress Notes (Signed)
Patient ID: Veronica [name], female   DOB: 11-24-1940, 71 y.o.   MRN: [id number] Subjective: Patient seen. Feels better. Denies any complaints. 3 set of cardiac enzymes unremarkable. And 2-D echo done showed EF about 65%. No regional wall motion abnormalities.  Objective: Weight change:   Intake/Output Summary (Last 24 hours) at 03/04/11 0907 Last data filed at 03/04/11 0700  Gross per 24 hour  Intake   4497 ml  Output      2 ml  Net   4495 ml   BP 110/61  Pulse 61  Temp(Src) 97.9 F (36.6 C) (Oral)  Resp 18  Ht 5\' 6"  (1.676 m)  Wt 70.308 kg (155 lb)  BMI 25.02 kg/m2  SpO2 91% Physical Exam: General appearance: alert, cooperative and no distress Head: Normocephalic, without obvious abnormality, atraumatic Neck: no adenopathy, no carotid bruit, no JVD, supple, symmetrical, trachea midline and thyroid not enlarged, symmetric, no tenderness/mass/nodules Lungs: clear to auscultation bilaterally Heart: regular rate and rhythm, S1, S2 normal, no murmur, click, rub or gallop Abdomen: soft, non-tender; bowel sounds normal; no masses,  no organomegaly Extremities: extremities normal, atraumatic, no cyanosis or edema Skin: Skin color, texture, turgor normal. No rashes or lesions  Lab Results: Results for orders placed during the hospital encounter of 03/02/11 (from the past 48 hour(s))  CARDIAC PANEL(CRET KIN+CKTOT+MB+TROPI)     Status: Normal   Collection Time   03/02/11  9:38 PM      Component Value Range Comment   Total CK 72  7 - 177 (U/L)    CK, MB 2.2  0.3 - 4.0 (ng/mL)    Troponin I <0.30  <0.30 (ng/mL)    Relative Index RELATIVE INDEX IS INVALID  0.0 - 2.5    CBC     Status: Abnormal   Collection Time   03/02/11  9:38 PM      Component Value Range Comment   WBC 11.4 (*) 4.0 - 10.5 (K/uL)    RBC 3.50 (*) 3.87 - 5.11 (MIL/uL)    Hemoglobin 10.4 (*) 12.0 - 15.0 (g/dL)    HCT 11.9 (*) 14.7 - 46.0 (%)    MCV 86.9  78.0 - 100.0 (fL)    MCH 29.7  26.0 - 34.0 (pg)    MCHC 34.2   30.0 - 36.0 (g/dL)    RDW 82.9  56.2 - 13.0 (%)    Platelets 201  150 - 400 (K/uL)   DIFFERENTIAL     Status: Abnormal   Collection Time   03/02/11  9:38 PM      Component Value Range Comment   Neutrophils Relative 92 (*) 43 - 77 (%)    Neutro Abs 10.5 (*) 1.7 - 7.7 (K/uL)    Lymphocytes Relative 4 (*) 12 - 46 (%)    Lymphs Abs 0.5 (*) 0.7 - 4.0 (K/uL)    Monocytes Relative 4  3 - 12 (%)    Monocytes Absolute 0.4  0.1 - 1.0 (K/uL)    Eosinophils Relative 0  0 - 5 (%)    Eosinophils Absolute 0.0  0.0 - 0.7 (K/uL)    Basophils Relative 0  0 - 1 (%)    Basophils Absolute 0.0  0.0 - 0.1 (K/uL)   COMPREHENSIVE METABOLIC PANEL     Status: Abnormal   Collection Time   03/02/11  9:38 PM      Component Value Range Comment   Sodium 134 (*) 135 - 145 (mEq/L)    Potassium 3.8  3.5 -  5.1 (mEq/L) SLIGHT HEMOLYSIS   Chloride 97  96 - 112 (mEq/L)    CO2 25  19 - 32 (mEq/L)    Glucose, Bld 145 (*) 70 - 99 (mg/dL)    BUN 14  6 - 23 (mg/dL)    Creatinine, Ser 1.47  0.50 - 1.10 (mg/dL)    Calcium 9.7  8.4 - 10.5 (mg/dL)    Total Protein 6.7  6.0 - 8.3 (g/dL)    Albumin 3.8  3.5 - 5.2 (g/dL)    AST 36  0 - 37 (U/L) SLIGHT HEMOLYSIS   ALT 16  0 - 35 (U/L)    Alkaline Phosphatase 70  39 - 117 (U/L)    Total Bilirubin 0.8  0.3 - 1.2 (mg/dL)    GFR calc non Af Amer 84 (*) >90 (mL/min)    GFR calc Af Amer >90  >90 (mL/min)   LIPASE, BLOOD     Status: Normal   Collection Time   03/02/11  9:38 PM      Component Value Range Comment   Lipase 18  11 - 59 (U/L)   CBC     Status: Abnormal   Collection Time   03/03/11  4:09 AM      Component Value Range Comment   WBC 10.5  4.0 - 10.5 (K/uL)    RBC 3.19 (*) 3.87 - 5.11 (MIL/uL)    Hemoglobin 9.5 (*) 12.0 - 15.0 (g/dL)    HCT 82.9 (*) 56.2 - 46.0 (%)    MCV 87.5  78.0 - 100.0 (fL)    MCH 29.8  26.0 - 34.0 (pg)    MCHC 34.1  30.0 - 36.0 (g/dL)    RDW 13.0  86.5 - 78.4 (%)    Platelets 158  150 - 400 (K/uL)   CARDIAC PANEL(CRET KIN+CKTOT+MB+TROPI)      Status: Normal   Collection Time   03/03/11  4:09 AM      Component Value Range Comment   Total CK 58  7 - 177 (U/L)    CK, MB 2.0  0.3 - 4.0 (ng/mL)    Troponin I <0.30  <0.30 (ng/mL)    Relative Index RELATIVE INDEX IS INVALID  0.0 - 2.5    CREATININE, SERUM     Status: Abnormal   Collection Time   03/03/11  4:09 AM      Component Value Range Comment   Creatinine, Ser 0.79  0.50 - 1.10 (mg/dL)    GFR calc non Af Amer 81 (*) >90 (mL/min)    GFR calc Af Amer >90  >90 (mL/min)   CARDIAC PANEL(CRET KIN+CKTOT+MB+TROPI)     Status: Normal   Collection Time   03/03/11 10:05 AM      Component Value Range Comment   Total CK 67  7 - 177 (U/L)    CK, MB 2.3  0.3 - 4.0 (ng/mL)    Troponin I <0.30  <0.30 (ng/mL)    Relative Index RELATIVE INDEX IS INVALID  0.0 - 2.5    CARDIAC PANEL(CRET KIN+CKTOT+MB+TROPI)     Status: Normal   Collection Time   03/03/11  5:02 PM      Component Value Range Comment   Total CK 85  7 - 177 (U/L)    CK, MB 3.6  0.3 - 4.0 (ng/mL)    Troponin I <0.30  <0.30 (ng/mL)    Relative Index RELATIVE INDEX IS INVALID  0.0 - 2.5      Micro Results: No results found  for this or any previous visit (from the past 240 hour(s)).  Studies/Results: Ct Abdomen Pelvis W Contrast  03/02/2011  *RADIOLOGY REPORT*  Clinical Data: Lower quadrant pain.  CT ABDOMEN AND PELVIS WITH CONTRAST  Technique:  Multidetector CT imaging of the abdomen and pelvis was performed following the standard protocol during bolus administration of intravenous contrast.  Contrast: 80mL OMNIPAQUE IOHEXOL 300 MG/ML IV SOLN  Comparison: None.  Findings: Dependent atelectasis at the lung bases.  The liver appears within normal limits.  Spleen normal.  Patulous gastroesophageal junction with oral contrast in the distal esophagus.  Bilateral renal low density lesions are present, bilateral simple renal cysts are present.  Some of the lesions are too small to characterize but statistically likely represents cysts.   Largest cystic lesion is in the left inferior renal pole measuring 26 mm. Abdominal atherosclerosis is present.  Exophytic cyst extends off the left upper renal pole, measuring 29 mm.  15 mm left adrenal nodule is present incompletely characterized but statistically likely representing adenoma.  Small bowel appears within normal limits.  Tiny fat containing umbilical hernia. Gallbladder appears normal.  No calcified gallstones.  Pancreas within normal limits.  Aortoiliofemoral atherosclerosis without aneurysm.  There is a dilated tubular structure in the right lower quadrant compatible with acute appendicitis.  No perforation or abscess.  Severe colonic diverticulosis.  Uterus and adnexa appear normal.  Urinary bladder normal.  Scattered phleboliths in the anatomic pelvis.  The bones appear within normal limits.  L4-L5 predominant lumbar spondylosis. Mild mural thickening of the splenic flexure of the colon is probably due to under distention.  If colonoscopy has not been performed recently, it is recommended.  IMPRESSION: 1.  Acute appendicitis.  No perforation or abscess. 2.  Bilateral renal cysts. 3.  15 mm left adrenal nodule, statistically likely adenoma but inadequately evaluated. 4.  Atherosclerosis. 5.  Thickening of the splenic flexure of the colon may be due to under distention.  Follow-up colonoscopy is recommended if not recently performed.  Original Report Authenticated By: Andreas Newport, M.D.   Medications: Scheduled Meds:   . sodium chloride   Intravenous Once  . citalopram  40 mg Oral Daily  . heparin  5,000 Units Subcutaneous Q8H  . levothyroxine  75 mcg Oral QAC breakfast   Continuous Infusions:   . sodium chloride 100 mL/hr at 03/03/11 2215   PRN Meds:.morphine injection, oxyCODONE-acetaminophen, DISCONTD: promethazine  Assessment/Plan: #1 questionable abnormal EKG-cardiac enzymes negative. 2-D echo showed EF of about 65% with no regional wall motion abnormality. Normal  workup. #2 status post laparoscopic appendectomy-management as per surgeon #3 hypothyroidism-patient to continue Synthroid Patient is medically stable Will sign off. Thank you for allowing me to participate in the management of this patient   LOS: 2 days   Bluma Buresh 03/04/2011, 9:07 AM

## 2011-03-04 NOTE — Discharge Summary (Signed)
F/U [facility] in a few weeks, call with concerns

## 2011-03-04 NOTE — Discharge Summary (Signed)
Physician Discharge Summary  Patient ID: ANONA GIOVANNINI MRN: [id number] DOB/AGE: March 18, 1940 71 y.o. Primary Care: [facility]  Admit date: 03/02/2011 Discharge date: 03/04/2011  Admission Diagnoses: RLQ pain, nausea and vomiting.  Discharge Diagnoses: Acute appendictis Abnormal EKG Hypothyroid  PROCEDURES: Laparoscopic Appendectomy 03/03/11 Dr. Hiram Comber Course:patient is a 71 year old female patient is a 71 year old female who was admitted with abdominal pain nausea and vomiting. CT scan revealed acute appendicitis with no perforation or abscess. She also had bilateral renal cysts a 15 mm left adrenal atherosclerosis of thickening the splenic flexure of the colon. She was seen by Dr. Luretha Murphy in the ER treated with Pincus Sanes and taken the OR for laparoscopic appendectomy. She tolerated the procedure well but had some ST changes on her EKG, was recommended she undergo evaluation for this postoperatively. She was seen by Dr.Nwaolocl. His opinion is no ST depression and that this was a repolarization. CK and troponins were negative x4 daily echo was obtained which showed an EF of 65%, stage I diastolic dysfunction, no regional wall abnormality. It was his opinion she had no cardiac issues that required further evaluation. At this point she was on liquids and tolerating that well walking in the halls, and anxious to go home. Her incisions looked good, she is passing gas.. we plan to discharge her later this a.m. Followup: She returned to see Dr. Daphine Deutscher, or the [facility] clinic in 2-3 weeks. She can followup with Dr. Pete Glatter as needed. Discharge meds: She is instructed to use plain Tylenol or ibuprofen when necessary for pain. We will give her Percocet 1-2 by mouth every 4 hours when necessary #40. She can resume her preadmission medicines as before. On discharge: Improved. Will [facility] physician assistant for Dr. Viviann Spare gross.   Disposition: Home or Self Care   Current  Discharge Medication List    START taking these medications   Details  oxyCODONE-acetaminophen (PERCOCET) 5-325 MG per tablet Take 1-2 tablets by mouth every 4 (four) hours as needed. Qty: 40 tablet, Refills: 0      CONTINUE these medications which have NOT CHANGED   Details  citalopram (CELEXA) 40 MG tablet Take 40 mg by mouth daily.    levothyroxine (SYNTHROID, LEVOTHROID) 75 MCG tablet Take 75 mcg by mouth daily.       Follow-up Information    Schedule an appointment as soon as possible for a visit with Luretha Murphy B, MD. (Call for an appointment in 2-3 weeks.  Ask for the [facility] clinic or DR. Daphine Deutscher.  Call if you have any problems before your appointment.)    Contact information:   [facility] [address] [place] [address] [phone]          Signed: Sherrie George 03/04/2011, 10:12 AM

## 2011-03-04 NOTE — Progress Notes (Signed)
No cardiac events & workup reassuring for no mjr problems Agree w D/C home as pt is meeting goals

## 2011-03-04 NOTE — Progress Notes (Signed)
1 Day Post-Op  Subjective: Just out of BR after washing hair. Ready to go home.  + flatus, still on clears. Objective: Vital signs in last 24 hours: Temp:  [97.9 F (36.6 C)-98.4 F (36.9 C)] 97.9 F (36.6 C) (01/11 0641) Pulse Rate:  [61] 61  (01/11 0641) Resp:  [16-18] 18  (01/11 0641) BP: (110)/(58-61) 110/61 mmHg (01/11 0641) SpO2:  [91 %-99 %] 91 % (01/11 0641) Weight:  [70.308 kg (155 lb)] 70.308 kg (155 lb) (01/10 1059) Last BM Date: 03/02/11  Intake/Output from previous day: 01/10 0701 - 01/11 0700 In: 4497 [P.O.:2002; I.V.:2495] Out: 2 [Urine:2] Intake/Output this shift:    PE:  Alert, NAD, SR on monitor in ICU,  Cardiac:  No murmur or rub, Norm s1s2.  Chest clear, Abd; Sl tender, +BS, no distension.+flatus  No distension  Ambulating without difficulty. Lab Results:   [facility] 03/03/11 0409 03/02/11 2138  WBC 10.5 11.4*  HGB 9.5* 10.4*  HCT 27.9* 30.4*  PLT 158 201    BMET  Basename 03/03/11 0409 03/02/11 2138  NA -- 134*  K -- 3.8  CL -- 97  CO2 -- 25  GLUCOSE -- 145*  BUN -- 14  CREATININE 0.79 0.71  CALCIUM -- 9.7   PT/INR No results found for this basename: LABPROT:2,INR:2 in the last 72 hours   Studies/Results: Ct Abdomen Pelvis W Contrast  03/02/2011  *RADIOLOGY REPORT*  Clinical Data: Lower quadrant pain.  CT ABDOMEN AND PELVIS WITH CONTRAST  Technique:  Multidetector CT imaging of the abdomen and pelvis was performed following the standard protocol during bolus administration of intravenous contrast.  Contrast: 80mL OMNIPAQUE IOHEXOL 300 MG/ML IV SOLN  Comparison: None.  Findings: Dependent atelectasis at the lung bases.  The liver appears within normal limits.  Spleen normal.  Patulous gastroesophageal junction with oral contrast in the distal esophagus.  Bilateral renal low density lesions are present, bilateral simple renal cysts are present.  Some of the lesions are too small to characterize but statistically likely represents cysts.  Largest  cystic lesion is in the left inferior renal pole measuring 26 mm. Abdominal atherosclerosis is present.  Exophytic cyst extends off the left upper renal pole, measuring 29 mm.  15 mm left adrenal nodule is present incompletely characterized but statistically likely representing adenoma.  Small bowel appears within normal limits.  Tiny fat containing umbilical hernia. Gallbladder appears normal.  No calcified gallstones.  Pancreas within normal limits.  Aortoiliofemoral atherosclerosis without aneurysm.  There is a dilated tubular structure in the right lower quadrant compatible with acute appendicitis.  No perforation or abscess.  Severe colonic diverticulosis.  Uterus and adnexa appear normal.  Urinary bladder normal.  Scattered phleboliths in the anatomic pelvis.  The bones appear within normal limits.  L4-L5 predominant lumbar spondylosis. Mild mural thickening of the splenic flexure of the colon is probably due to under distention.  If colonoscopy has not been performed recently, it is recommended.  IMPRESSION: 1.  Acute appendicitis.  No perforation or abscess. 2.  Bilateral renal cysts. 3.  15 mm left adrenal nodule, statistically likely adenoma but inadequately evaluated. 4.  Atherosclerosis. 5.  Thickening of the splenic flexure of the colon may be due to under distention.  Follow-up colonoscopy is recommended if not recently performed.  Original Report Authenticated By: Andreas Newport, M.D.    Anti-infectives: Anti-infectives     Start     Dose/Rate Route Frequency Ordered Stop   03/02/11 2345   ertapenem (INVANZ) 1 g  in sodium chloride 0.9 % 50 mL IVPB        1 g 100 mL/hr over 30 Minutes Intravenous  Once 03/02/11 2342 03/03/11 0043         Current Facility-Administered Medications  Medication Dose Route Frequency Provider Last Rate Last Dose  . 0.9 %  sodium chloride infusion   Intravenous Continuous Sherrie George, PA 100 mL/hr at 03/03/11 2215    . 0.9 %  sodium chloride infusion    Intravenous Once Sherrie George, [place]      . citalopram (CELEXA) tablet 40 mg  40 mg Oral Daily Valarie Merino, MD   40 mg at 03/03/11 1040  . heparin injection 5,000 Units  5,000 Units Subcutaneous Q8H Valarie Merino, MD   5,000 Units at 03/03/11 2110  . levothyroxine (SYNTHROID, LEVOTHROID) tablet 75 mcg  75 mcg Oral QAC breakfast Valarie Merino, MD   75 mcg at 03/04/11 0851  . morphine 2 MG/ML injection 1-3 mg  1-3 mg Intravenous Q1H PRN Sherrie George, PA   2 mg at 03/03/11 1133  . oxyCODONE-acetaminophen (PERCOCET) 5-325 MG per tablet 1-2 tablet  1-2 tablet Oral Q4H PRN Sherrie George, PA   1 tablet at 03/04/11 0851  . DISCONTD: promethazine (PHENERGAN) injection 6.25-12.5 mg  6.25-12.5 mg Intravenous Q15 min PRN Jill Side, MD        Assessment/Plan  Acute Appendicitis Hypothyroid Hx Depression on SSRI Intraoperative ST changes on EKG strip.  Plan: Doing well from appendectomy.  Cleared by medicine with normal  Ck/trop x 4. Norm 2D Echo.  Will D/C home today.       LOS: 2 days    Veronica [name] 03/04/2011

## 2011-03-07 NOTE — Anesthesia Postprocedure Evaluation (Signed)
  Anesthesia Post-op Note  Patient: Veronica [name]  Procedure(s) Performed:  APPENDECTOMY LAPAROSCOPIC  Patient Location: PACU  Anesthesia Type: General  Level of Consciousness: oriented and sedated  Airway and Oxygen Therapy: Patient Spontanous Breathing and Patient connected to nasal cannula oxygen  Post-op Pain: mild  Post-op Assessment: Post-op Vital signs reviewed, Patient's Cardiovascular Status Stable, Respiratory Function Stable and Patent Airway  Post-op Vital Signs: stable  Complications: No apparent anesthesia complications

## 2011-03-09 ENCOUNTER — Encounter (HOSPITAL_COMMUNITY): Payer: Self-pay | Admitting: Surgery

## 2011-03-18 ENCOUNTER — Telehealth (INDEPENDENT_AMBULATORY_CARE_PROVIDER_SITE_OTHER): Payer: Self-pay | Admitting: General Surgery

## 2011-03-18 ENCOUNTER — Encounter (INDEPENDENT_AMBULATORY_CARE_PROVIDER_SITE_OTHER): Payer: Self-pay | Admitting: Surgery

## 2011-03-18 NOTE — Telephone Encounter (Signed)
Tried to call the patient at her number [phone] was told this was the wrong number, left message on her emergency contacts voicemail stating that we were needing to cancel her appt today due to inclement weather and for them to call back on Monday to RS.

## 2011-03-21 ENCOUNTER — Telehealth (INDEPENDENT_AMBULATORY_CARE_PROVIDER_SITE_OTHER): Payer: Self-pay | Admitting: Surgery

## 2011-03-25 ENCOUNTER — Ambulatory Visit (INDEPENDENT_AMBULATORY_CARE_PROVIDER_SITE_OTHER): Payer: Medicare HMO | Admitting: Surgery

## 2011-03-25 ENCOUNTER — Encounter (INDEPENDENT_AMBULATORY_CARE_PROVIDER_SITE_OTHER): Payer: Self-pay | Admitting: Surgery

## 2011-03-25 VITALS — BP 130/76 | HR 70 | Temp 97.8°F | Resp 18 | Ht 67.0 in | Wt 149.8 lb

## 2011-03-25 DIAGNOSIS — E079 Disorder of thyroid, unspecified: Secondary | ICD-10-CM

## 2011-03-25 NOTE — Progress Notes (Signed)
Addended by: Latricia Heft on: 03/25/2011 03:12 PM   Modules accepted: Orders

## 2011-03-25 NOTE — Progress Notes (Signed)
Doing well post op.  Incisions from lap appy healing fine.  Return prn

## 2011-03-31 ENCOUNTER — Other Ambulatory Visit: Payer: Self-pay

## 2013-03-07 ENCOUNTER — Encounter (INDEPENDENT_AMBULATORY_CARE_PROVIDER_SITE_OTHER): Payer: Medicare PPO | Admitting: Ophthalmology

## 2013-03-07 DIAGNOSIS — H43819 Vitreous degeneration, unspecified eye: Secondary | ICD-10-CM

## 2013-03-07 DIAGNOSIS — E1139 Type 2 diabetes mellitus with other diabetic ophthalmic complication: Secondary | ICD-10-CM

## 2013-03-07 DIAGNOSIS — H33309 Unspecified retinal break, unspecified eye: Secondary | ICD-10-CM

## 2013-03-07 DIAGNOSIS — E11319 Type 2 diabetes mellitus with unspecified diabetic retinopathy without macular edema: Secondary | ICD-10-CM

## 2013-03-07 DIAGNOSIS — H26499 Other secondary cataract, unspecified eye: Secondary | ICD-10-CM

## 2013-03-07 DIAGNOSIS — E1165 Type 2 diabetes mellitus with hyperglycemia: Secondary | ICD-10-CM

## 2013-04-04 ENCOUNTER — Ambulatory Visit (INDEPENDENT_AMBULATORY_CARE_PROVIDER_SITE_OTHER): Payer: Medicare PPO | Admitting: Ophthalmology

## 2013-04-04 DIAGNOSIS — H2700 Aphakia, unspecified eye: Secondary | ICD-10-CM

## 2014-01-01 LAB — HEPATIC FUNCTION PANEL
ALT: 14 U/L (ref 7–35)
AST: 23 U/L (ref 13–35)
Alkaline Phosphatase: 43 U/L (ref 25–125)

## 2014-01-01 LAB — BASIC METABOLIC PANEL
CREATININE: 1 mg/dL (ref 0.5–1.1)
GLUCOSE: 160 mg/dL
POTASSIUM: 4.2 mmol/L (ref 3.4–5.3)

## 2014-01-01 LAB — LIPID PANEL
Cholesterol: 164 mg/dL (ref 0–200)
HDL: 58 mg/dL (ref 35–70)
LDL CALC: 88 mg/dL

## 2014-01-01 LAB — MICROALBUMIN / CREATININE URINE RATIO: MICROALB/CREAT RATIO: 30.5

## 2014-01-01 LAB — CBC AND DIFFERENTIAL
Hemoglobin: 13.1 g/dL (ref 12.0–16.0)
PLATELETS: 217 10*3/uL (ref 150–399)
WBC: 5.2 10*3/mL

## 2014-01-01 LAB — HEMOGLOBIN A1C: Hgb A1c MFr Bld: 7.9 % — AB (ref 4.0–6.0)

## 2014-02-28 ENCOUNTER — Encounter: Payer: Self-pay | Admitting: *Deleted

## 2014-02-28 ENCOUNTER — Encounter: Payer: Self-pay | Admitting: Family Medicine

## 2014-02-28 ENCOUNTER — Ambulatory Visit (INDEPENDENT_AMBULATORY_CARE_PROVIDER_SITE_OTHER): Payer: Medicare HMO | Admitting: Family Medicine

## 2014-02-28 VITALS — BP 154/87 | HR 94 | Ht 66.0 in | Wt 139.0 lb

## 2014-02-28 DIAGNOSIS — F418 Other specified anxiety disorders: Secondary | ICD-10-CM

## 2014-02-28 DIAGNOSIS — F32A Depression, unspecified: Secondary | ICD-10-CM

## 2014-02-28 DIAGNOSIS — F419 Anxiety disorder, unspecified: Secondary | ICD-10-CM

## 2014-02-28 DIAGNOSIS — E039 Hypothyroidism, unspecified: Secondary | ICD-10-CM

## 2014-02-28 DIAGNOSIS — F329 Major depressive disorder, single episode, unspecified: Secondary | ICD-10-CM

## 2014-02-28 DIAGNOSIS — E119 Type 2 diabetes mellitus without complications: Secondary | ICD-10-CM

## 2014-02-28 MED ORDER — PIOGLITAZONE HCL 30 MG PO TABS: 30.0000 mg | ORAL_TABLET | Freq: Every day | ORAL | Status: DC

## 2014-02-28 MED ORDER — CLONAZEPAM 0.5 MG PO TABS: ORAL_TABLET | ORAL | Status: DC

## 2014-02-28 MED ORDER — CITALOPRAM HYDROBROMIDE 40 MG PO TABS: 40.0000 mg | ORAL_TABLET | Freq: Every day | ORAL | Status: DC

## 2014-02-28 MED ORDER — LINAGLIPTIN-METFORMIN HCL 2.5-1000 MG PO TABS: ORAL_TABLET | ORAL | Status: DC

## 2014-02-28 MED ORDER — ASPIRIN 81 MG PO TABS: 81.0000 mg | ORAL_TABLET | Freq: Every day | ORAL | Status: DC

## 2014-02-28 NOTE — Progress Notes (Signed)
CC: Veronica [name] is [name] 73 y.o. female is here for Establish Care   Subjective: HPI:  Pleasant 70 year oldHere to establish care wife of Veronica [name]  Reports [name] history of hypothyroidism that has been present for at least [name] decade. She is currently taking levothyroxine 75 g daily. She denies any recent skin changes hair changes nor unintentional weight loss or gain. No change in appetite, she tells me she usually eats only once [name] day around lunchtime.  Reports [name] history of type 2 diabetes that was diagnosed somewhere around 2-3 years ago. She was originally only taking metformin however was started on Amaryl sometime in 2015 but had hives so this was discontinued and linagliptin was prescribed what sounds to be somewhere between 3 and 6 months ago. She tells me her last A1c was "160" about 3 months ago. She rings and blood sugars today all of which are random and they fluctuate from 110-170. She denies polyuria polyphagia or polydipsia. She sees an eye doctor at least annually.  Reports [name] history of anxiety and depression that stems back all the way to her adolescence. She's been on multiple medications for this however for the last 10 years she tells me she's been taking citalopram and as needed clonazepam. Provided she takes this medication she denies any level of anxiety or depression that gets in the way of her quality of life. There have been no docile and on herself or others. She takes clonazepam every night before bed and will take 1-2 doses in the daytime depending on her activities during the day. Social or community obligations provide anxiety to the point where she takes clonazepam.  Review of Systems - General ROS: negative for - chills, fever, night sweats, weight gain or weight loss Ophthalmic ROS: negative for - decreased vision Psychological ROS: negative for - mental disturbance other than that described above ENT ROS: negative for - hearing change, nasal congestion, tinnitus or  allergies Hematological and Lymphatic ROS: negative for - bleeding problems, bruising or swollen lymph nodes Breast ROS: negative Respiratory ROS: no cough, shortness of breath, or wheezing Cardiovascular ROS: no chest pain or dyspnea on exertion Gastrointestinal ROS: no abdominal pain, change in bowel habits, or black or bloody stools Genito-Urinary ROS: negative for - genital discharge, genital ulcers, incontinence or abnormal bleeding from genitals Musculoskeletal ROS: negative for - joint pain or muscle pain Neurological ROS: negative for - headaches or memory loss Dermatological ROS: negative for lumps, mole changes, rash and skin lesion changes  Past Medical History  Diagnosis Date  . Hypothyroid   . Anemia     Past Surgical History  Procedure Laterality Date  . Cesarean section    . Tonsillectomy    . Laparoscopic appendectomy  03/03/2011    Procedure: APPENDECTOMY LAPAROSCOPIC;  Surgeon: Valarie Merino, MD;  Location: [facility];  Service: General;  Laterality: N/[name];  . Appendectomy  02/28/11   Family History  Problem Relation Age of Onset  . Cancer Mother     breast  . Cancer Father     axillary tumor    History   Social History  . Marital Status: Married    Spouse Name: N/[name]    Number of Children: N/[name]  . Years of Education: N/[name]   Occupational History  . Not on file.   Social History Main Topics  . Smoking status: Never Smoker   . Smokeless tobacco: Never Used  . Alcohol Use: No  . Drug Use: No  . Sexual  Activity: Not on file   Other Topics Concern  . Not on file   Social History Narrative     Objective: BP 154/87 mmHg  Pulse 94  Ht 5\' 6"  (1.676 m)  Wt 139 lb (63.05 kg)  BMI 22.45 kg/m2  General: Alert and Oriented, No Acute Distress HEENT: Pupils equal, round, reactive to light. Conjunctivae clear.  Moist mucous membranes pharynx unremarkable Lungs: Clear to auscultation bilaterally, no wheezing/ronchi/rales.  Comfortable work of breathing. Good air  movement. Cardiac: Regular rate and rhythm. Normal S1/S2.  No murmurs, rubs, nor gallops.  No carotid bruits Extremities: No peripheral edema.  Strong peripheral pulses.  Mental Status: No depression, anxiety, nor agitation. Skin: Warm and dry.  Assessment & Plan: Veronica [name] was seen today for establish care.  Diagnoses and associated orders for this visit:  Hypothyroidism, unspecified hypothyroidism type - TSH  Type 2 diabetes mellitus without complication - Linagliptin-Metformin HCl 2.06-998 MG TABS; One by mouth twice [name] day. - pioglitazone (ACTOS) 30 MG tablet; Take 1 tablet (30 mg total) by mouth daily. - Hemoglobin A1c - COMPLETE METABOLIC PANEL WITH GFR  Anxiety and depression - clonazePAM (KLONOPIN) 0.5 MG tablet; Two by mouth daily as needed for anxiety and nightly for sleep. - citalopram (CELEXA) 40 MG tablet; Take 1 tablet (40 mg total) by mouth daily.  Other Orders - aspirin 81 MG tablet; Take 1 tablet (81 mg total) by mouth daily.    Hypothyroidism: Due for TSH she will need refills on levothyroxine pending these results Type 2 diabetes: Uncontrolled chronic condition, suboptimal blood sugars at home, checking A1c today switching to [name] combination formulation of linagliptin-metformin and begin Actos.Checking renal function. Continue aspirin and statin. Anxiety and depression: Controlled on clonazepam and citalopram. Refills were provided    Return in about 3 months (around 05/30/2014) for Mood and Blood Sugar.

## 2014-03-01 LAB — COMPLETE METABOLIC PANEL WITH GFR
ALT: 11 U/L (ref 0–35)
AST: 22 U/L (ref 0–37)
Albumin: 4.3 g/dL (ref 3.5–5.2)
Alkaline Phosphatase: 52 U/L (ref 39–117)
BUN: 19 mg/dL (ref 6–23)
CALCIUM: 10.1 mg/dL (ref 8.4–10.5)
CHLORIDE: 103 meq/L (ref 96–112)
CO2: 25 meq/L (ref 19–32)
CREATININE: 0.81 mg/dL (ref 0.50–1.10)
GFR, EST AFRICAN AMERICAN: 82 mL/min
GFR, Est Non African American: 71 mL/min
Glucose, Bld: 131 mg/dL — ABNORMAL HIGH (ref 70–99)
Potassium: 4.6 mEq/L (ref 3.5–5.3)
SODIUM: 138 meq/L (ref 135–145)
Total Bilirubin: 0.4 mg/dL (ref 0.2–1.2)
Total Protein: 6.5 g/dL (ref 6.0–8.3)

## 2014-03-01 LAB — HEMOGLOBIN A1C
HEMOGLOBIN A1C: 7.3 % — AB (ref <5.7)
Mean Plasma Glucose: 163 mg/dL — ABNORMAL HIGH (ref <117)

## 2014-03-01 LAB — TSH: TSH: 1.832 u[IU]/mL (ref 0.350–4.500)

## 2014-03-02 ENCOUNTER — Telehealth: Payer: Self-pay | Admitting: Family Medicine

## 2014-03-02 DIAGNOSIS — E119 Type 2 diabetes mellitus without complications: Secondary | ICD-10-CM

## 2014-03-02 MED ORDER — LEVOTHYROXINE SODIUM 75 MCG PO TABS: 75.0000 ug | ORAL_TABLET | Freq: Every day | ORAL | Status: DC

## 2014-03-02 NOTE — Telephone Encounter (Signed)
[name],  Will you please let patient know that her thyroid supplementation was adequate therefore I sent a refill of levothyroxine to her [facility] [place]. Her A1c was 7.3 with a goal of less than 7. As we discussed in her visit I encouraged her to continue on metformin and linagliptin in the new combination tablet along with starting pioglitazone for improved blood sugar control.  Her kidney function and liver function was normal. I would encourage her to follow-up with me in 3 months.

## 2014-03-03 ENCOUNTER — Encounter: Payer: Self-pay | Admitting: Family Medicine

## 2014-03-03 DIAGNOSIS — R809 Proteinuria, unspecified: Secondary | ICD-10-CM

## 2014-03-03 DIAGNOSIS — M8580 Other specified disorders of bone density and structure, unspecified site: Secondary | ICD-10-CM

## 2014-03-03 DIAGNOSIS — E1129 Type 2 diabetes mellitus with other diabetic kidney complication: Secondary | ICD-10-CM

## 2014-03-03 HISTORY — DX: Other specified disorders of bone density and structure, unspecified site: M85.80

## 2014-03-03 HISTORY — DX: Type 2 diabetes mellitus with other diabetic kidney complication: E11.29

## 2014-03-03 NOTE — Telephone Encounter (Signed)
Pt notified and faxed over labs through epic

## 2014-03-19 ENCOUNTER — Ambulatory Visit (INDEPENDENT_AMBULATORY_CARE_PROVIDER_SITE_OTHER): Payer: Medicare PPO | Admitting: Ophthalmology

## 2014-03-19 DIAGNOSIS — E11321 Type 2 diabetes mellitus with mild nonproliferative diabetic retinopathy with macular edema: Secondary | ICD-10-CM

## 2014-03-19 DIAGNOSIS — H33303 Unspecified retinal break, bilateral: Secondary | ICD-10-CM

## 2014-03-19 DIAGNOSIS — H43813 Vitreous degeneration, bilateral: Secondary | ICD-10-CM

## 2014-03-19 DIAGNOSIS — E11319 Type 2 diabetes mellitus with unspecified diabetic retinopathy without macular edema: Secondary | ICD-10-CM

## 2014-03-25 ENCOUNTER — Ambulatory Visit: Payer: Self-pay | Admitting: Family Medicine

## 2014-03-26 ENCOUNTER — Ambulatory Visit (INDEPENDENT_AMBULATORY_CARE_PROVIDER_SITE_OTHER): Payer: Medicare PPO | Admitting: Ophthalmology

## 2014-04-01 ENCOUNTER — Telehealth: Payer: Self-pay

## 2014-04-01 DIAGNOSIS — F32A Depression, unspecified: Secondary | ICD-10-CM

## 2014-04-01 DIAGNOSIS — F419 Anxiety disorder, unspecified: Principal | ICD-10-CM

## 2014-04-01 DIAGNOSIS — F329 Major depressive disorder, single episode, unspecified: Secondary | ICD-10-CM

## 2014-04-01 MED ORDER — CLONAZEPAM 0.5 MG PO TABS
ORAL_TABLET | ORAL | Status: DC
Start: 2014-04-01 — End: 2014-06-19

## 2014-04-01 MED ORDER — CLONAZEPAM 0.5 MG PO TABS: ORAL_TABLET | ORAL | Status: DC

## 2014-04-01 NOTE — Telephone Encounter (Signed)
Driving  Me  Crazy, [name], Rx placed in in-box ready for pickup/faxing. Prior rx from today destroyed.

## 2014-04-01 NOTE — Telephone Encounter (Signed)
Veronica LushAndrea, I'm confused because the way I originally wrote it would be for 30 days and basically says what she's requesting.  I'll write the instructions differently in hopes it clears up any confusion. Rx in your inbox

## 2014-04-01 NOTE — Telephone Encounter (Signed)
Patient was seen by Dr. Warden FillersJohn Lambeth, MD Address: [address], [place], [place] [address] [phone] for DM. He switched her medication to Victoza. Patient did not tolerate the Victoza well. She went back on the medications Dr Veronica [name] prescribed. She stated she will not return to Dr Veronica [name]. Called for office notes. In Dr Dow ChemicalHommel's basket.

## 2014-04-01 NOTE — Telephone Encounter (Signed)
Veronica [name] is wanting to pick up a refill on the clonazepam and it has been 30 days. [facility] pharmacist called and states the clonazepam prescription is for 45 day supply and not a 30 day supply. She states the prescription should read to take 1 twice daily for anxiety and once at night for sleep and not twice daily as needed for anxiety. Please advise.

## 2014-04-01 NOTE — Telephone Encounter (Addendum)
The pharmacist states the prescription cannot be written as needed. It has to be twice daily for anxiety and one at bedtime

## 2014-04-08 ENCOUNTER — Ambulatory Visit (INDEPENDENT_AMBULATORY_CARE_PROVIDER_SITE_OTHER): Payer: Medicare HMO | Admitting: Family Medicine

## 2014-04-08 ENCOUNTER — Encounter: Payer: Self-pay | Admitting: Family Medicine

## 2014-04-08 VITALS — BP 152/90 | HR 81 | Temp 97.8°F | Wt 136.0 lb

## 2014-04-08 DIAGNOSIS — J329 Chronic sinusitis, unspecified: Secondary | ICD-10-CM

## 2014-04-08 DIAGNOSIS — B9689 Other specified bacterial agents as the cause of diseases classified elsewhere: Secondary | ICD-10-CM

## 2014-04-08 DIAGNOSIS — A499 Bacterial infection, unspecified: Secondary | ICD-10-CM

## 2014-04-08 MED ORDER — AZITHROMYCIN 250 MG PO TABS: ORAL_TABLET | ORAL | Status: DC

## 2014-04-08 NOTE — Progress Notes (Signed)
CC: Veronica Veronica [name] is Veronica [name] 74 y.o. female is here for Sinusitis   Subjective: HPI:  Complains of facial pressure beneath both eyes that has been present for about Veronica [name] week now. Accompanied by postnasal drip and nasal congestion. She's tried Benadryl but had no benefit. Symptoms are present on Veronica [name] daily basis fluctuates throughout the day nothing particularly makes it better or worse. Symptoms overall are moderate in severity. She denies fevers, chills, cough, shortness of breath, chest pain, wheezing, nor motor or sensory disturbances.   Review Of Systems Outlined In HPI  Past Medical History  Diagnosis Date  . Hypothyroid   . Anemia     Past Surgical History  Procedure Laterality Date  . Cesarean section    . Tonsillectomy    . Laparoscopic appendectomy  03/03/2011    Procedure: APPENDECTOMY LAPAROSCOPIC;  Surgeon: Veronica MerinoMatthew B Martin, MD;  Location: [facility];  Service: General;  Laterality: N/Veronica [name];  . Appendectomy  02/28/11   Family History  Problem Relation Age of Onset  . Cancer Mother     breast  . Cancer Father     axillary tumor  . Diabetes Mother   . Hypertension Mother   . Stroke Mother     History   Social History  . Marital Status: Married    Spouse Name: N/Veronica [name]  . Number of Children: N/Veronica [name]  . Years of Education: N/Veronica [name]   Occupational History  . Not on file.   Social History Main Topics  . Smoking status: Never Smoker   . Smokeless tobacco: Never Used  . Alcohol Use: No  . Drug Use: No  . Sexual Activity: Not on file   Other Topics Concern  . Not on file   Social History Narrative     Objective: BP 152/90 mmHg  Pulse 81  Temp(Src) 97.8 F (36.6 C) (Oral)  Wt 136 lb (61.689 kg)  General: Alert and Oriented, No Acute Distress HEENT: Pupils equal, round, reactive to light. Conjunctivae clear.  External ears unremarkable, canals clear with intact TMs with appropriate landmarks.  Middle ear appears open without effusion. Pink inferior turbinates.  Moist mucous  membranes, pharynx without inflammation nor lesions however moderate cobblestoning and postnasal drip.  Neck supple without palpable lymphadenopathy nor abnormal masses. Lungs: Clear to auscultation bilaterally, no wheezing/ronchi/rales.  Comfortable work of breathing. Good air movement. Extremities: No peripheral edema.  Strong peripheral pulses.  Mental Status: No depression, anxiety, nor agitation. Skin: Warm and dry.  Assessment & Plan: Veronica Veronica [name] was seen today for sinusitis.  Diagnoses and all orders for this visit:  Bacterial sinusitis Orders: -     azithromycin (ZITHROMAX) 250 MG tablet; Take two tabs at once on day 1, then one tab daily on days 2-5.   Bacterial sinusitis: She is allergic to amoxicillin and therefore will try azithromycin. Consider nasal saline washes and Alka-Seltzer cold and sinus.   Return if symptoms worsen or fail to improve.

## 2014-04-11 ENCOUNTER — Telehealth: Payer: Self-pay | Admitting: *Deleted

## 2014-04-11 MED ORDER — PREDNISONE 20 MG PO TABS: ORAL_TABLET | ORAL | Status: AC

## 2014-04-11 NOTE — Telephone Encounter (Signed)
Pt states she was asked to call if she still had problems with sinus infection. She states she is having some vertigo and nausea. On the message she asks for something to reduce ear drum swelling

## 2014-04-11 NOTE — Telephone Encounter (Signed)
.  dc

## 2014-04-11 NOTE — Telephone Encounter (Signed)
Prednisone sent to [facility] [address]

## 2014-04-15 NOTE — Telephone Encounter (Signed)
.  dc

## 2014-05-05 ENCOUNTER — Encounter: Payer: Self-pay | Admitting: Family Medicine

## 2014-05-30 ENCOUNTER — Telehealth: Payer: Self-pay | Admitting: *Deleted

## 2014-05-30 DIAGNOSIS — E119 Type 2 diabetes mellitus without complications: Secondary | ICD-10-CM

## 2014-05-30 MED ORDER — LINAGLIPTIN-METFORMIN HCL 2.5-1000 MG PO TABS: ORAL_TABLET | ORAL | Status: DC

## 2014-05-30 NOTE — Telephone Encounter (Signed)
rx sent . [place] is in [place] currenltly. Advised that will need a f/u before future refills. Already due for f/u.pt is a traveling nurse

## 2014-06-19 ENCOUNTER — Telehealth: Payer: Self-pay

## 2014-06-19 DIAGNOSIS — F32A Depression, unspecified: Secondary | ICD-10-CM

## 2014-06-19 DIAGNOSIS — F329 Major depressive disorder, single episode, unspecified: Secondary | ICD-10-CM

## 2014-06-19 DIAGNOSIS — F419 Anxiety disorder, unspecified: Principal | ICD-10-CM

## 2014-06-19 MED ORDER — CLONAZEPAM 0.5 MG PO TABS: ORAL_TABLET | ORAL | Status: DC

## 2014-06-19 NOTE — Telephone Encounter (Signed)
Andrea, Rx placed in in-box ready for pickup/faxing.  

## 2014-06-19 NOTE — Telephone Encounter (Signed)
Faxed to [phone]

## 2014-06-19 NOTE — Telephone Encounter (Addendum)
Veronica [name] would like a 90 day refill on the clonazepam. She states [place] is on [place] in [place], [place]. Please advise.    [facility]     Address: [address], [place], [place] [address]  [phone]

## 2014-07-22 ENCOUNTER — Other Ambulatory Visit: Payer: Self-pay | Admitting: Family Medicine

## 2014-07-22 ENCOUNTER — Other Ambulatory Visit: Payer: Self-pay | Admitting: *Deleted

## 2014-07-22 DIAGNOSIS — E119 Type 2 diabetes mellitus without complications: Secondary | ICD-10-CM

## 2014-07-22 MED ORDER — PIOGLITAZONE HCL 30 MG PO TABS: 30.0000 mg | ORAL_TABLET | Freq: Every day | ORAL | Status: DC

## 2014-08-04 ENCOUNTER — Other Ambulatory Visit: Payer: Self-pay | Admitting: Family Medicine

## 2014-08-18 ENCOUNTER — Telehealth: Payer: Self-pay | Admitting: *Deleted

## 2014-08-18 ENCOUNTER — Other Ambulatory Visit: Payer: Self-pay | Admitting: Family Medicine

## 2014-08-18 DIAGNOSIS — E119 Type 2 diabetes mellitus without complications: Secondary | ICD-10-CM

## 2014-08-18 MED ORDER — PIOGLITAZONE HCL 30 MG PO TABS: 30.0000 mg | ORAL_TABLET | Freq: Every day | ORAL | Status: DC

## 2014-08-18 NOTE — Telephone Encounter (Signed)
Pt called my vm several times in an attemp to get a refill on her actos. She is a traveling Engineer, civil (consulting)nurse. I called her back and told her that I understand that she is a traveling nurse but the last time we spoke she was due for a f/u appt then.(see phone note) I told her that we cannot keep prescribing her medications with her following up. Pt wanted another rx to last her until August of her Actos. I told her that I would send one more month and that was all. She was last seen in Jan and was to f/u in April. Pt apologizes for calling multiple times but she states she panicked when she didn't get a "live person." I told her that she will most likely never get a live person when calling my voicemail and it is best to leave a message since I am in clinic assisting the physician. I also told her for refill requests we asked that you call the pharmacy and have them send us a request. Again I reiterated that another month of Actos will be sent and she will need to f/u or somehow establish care wherever she is going to be for the next couple of months or go to an UC. Pt voiced understanding.

## 2014-08-26 LAB — HEMOGLOBIN A1C: Hgb A1c MFr Bld: 7 % — AB (ref 4.0–6.0)

## 2014-08-26 LAB — BASIC METABOLIC PANEL: Glucose: 176 mg/dL

## 2014-09-22 ENCOUNTER — Ambulatory Visit: Payer: Self-pay | Admitting: Family Medicine

## 2014-10-03 ENCOUNTER — Ambulatory Visit: Payer: Self-pay | Admitting: Family Medicine

## 2014-10-06 ENCOUNTER — Encounter: Payer: Self-pay | Admitting: Family Medicine

## 2014-10-06 ENCOUNTER — Ambulatory Visit (INDEPENDENT_AMBULATORY_CARE_PROVIDER_SITE_OTHER): Payer: Medicare HMO | Admitting: Family Medicine

## 2014-10-06 ENCOUNTER — Telehealth: Payer: Self-pay | Admitting: Family Medicine

## 2014-10-06 VITALS — BP 136/71 | HR 92 | Ht 66.0 in | Wt 142.0 lb

## 2014-10-06 DIAGNOSIS — R809 Proteinuria, unspecified: Secondary | ICD-10-CM | POA: Diagnosis not present

## 2014-10-06 DIAGNOSIS — E119 Type 2 diabetes mellitus without complications: Secondary | ICD-10-CM

## 2014-10-06 DIAGNOSIS — F419 Anxiety disorder, unspecified: Secondary | ICD-10-CM

## 2014-10-06 DIAGNOSIS — E1129 Type 2 diabetes mellitus with other diabetic kidney complication: Secondary | ICD-10-CM

## 2014-10-06 DIAGNOSIS — F418 Other specified anxiety disorders: Secondary | ICD-10-CM | POA: Diagnosis not present

## 2014-10-06 DIAGNOSIS — F32A Depression, unspecified: Secondary | ICD-10-CM

## 2014-10-06 DIAGNOSIS — E039 Hypothyroidism, unspecified: Secondary | ICD-10-CM

## 2014-10-06 DIAGNOSIS — F329 Major depressive disorder, single episode, unspecified: Secondary | ICD-10-CM

## 2014-10-06 LAB — TSH: TSH: 1.091 u[IU]/mL (ref 0.350–4.500)

## 2014-10-06 MED ORDER — CLONAZEPAM 0.5 MG PO TABS: ORAL_TABLET | ORAL | Status: DC

## 2014-10-06 MED ORDER — ATORVASTATIN CALCIUM 10 MG PO TABS: 10.0000 mg | ORAL_TABLET | Freq: Every day | ORAL | Status: DC

## 2014-10-06 MED ORDER — CITALOPRAM HYDROBROMIDE 40 MG PO TABS: 40.0000 mg | ORAL_TABLET | Freq: Every day | ORAL | Status: DC

## 2014-10-06 MED ORDER — LINAGLIPTIN-METFORMIN HCL 2.5-1000 MG PO TABS: ORAL_TABLET | ORAL | Status: DC

## 2014-10-06 MED ORDER — PIOGLITAZONE HCL 30 MG PO TABS: 30.0000 mg | ORAL_TABLET | Freq: Every day | ORAL | Status: DC

## 2014-10-06 NOTE — Telephone Encounter (Signed)
Veronica [name], Can you please see if Veronica [name] [phone] (or another rep from [employer]) can provide me with some samples of Jentadueto at a dose of 2.5-1000mg .  I have a patient that is having trouble affording this now that she is in the donut hole.

## 2014-10-06 NOTE — Progress Notes (Signed)
CC: Veronica [name] is [name] 74 y.o. female is here for Diabetes and medication follow up   Subjective: HPI:   Follow-up hypothyroidism:  Continues on levothyroxine, 75 MCG on [name] daily basis. No unintentional weight loss or gain. Denies any hair or skin complaints. No  Constipation or diarrhea.   Follow-up anxiety: currently denies any depression. She is currently taking citalopram on [name] daily basis and Klonopin every evening and  Usually at least 1 dose during the day. Symptoms are worse when on the road an inch restful situations. Symptoms are also improved with the presence of her dog.  She describes her symptoms purely as anxiousness and restlessness  That are severe in severity and less the above  Interventions are in place. She currently believes it is well controlled and managed with as needed clonazepam.   Follow-up type 2 diabetes: Since I saw her last she began taking actos along her jentadueto.  She denies any new side effects. She states that blood sugars have been ranging from the 80s- 150s first thing in the morning. She brings in A1c from July reflecting [name] value of 7.0. Denies any polyuria or polyphagia or polydipsia. Admits that she could  Be [name] little bit more physically active, this was [name] challenge while she was on the road for the past 7 months.   Review Of Systems Outlined In HPI  Past Medical History  Diagnosis Date  . Hypothyroid   . Anemia     Past Surgical History  Procedure Laterality Date  . Cesarean section    . Tonsillectomy    . Laparoscopic appendectomy  03/03/2011    Procedure: APPENDECTOMY LAPAROSCOPIC;  Surgeon: Valarie Merino, MD;  Location: [facility];  Service: General;  Laterality: N/[name];  . Appendectomy  02/28/11   Family History  Problem Relation Age of Onset  . Cancer Mother     breast  . Cancer Father     axillary tumor  . Diabetes Mother   . Hypertension Mother   . Stroke Mother     Social History   Social History  . Marital Status: Married   Spouse Name: N/[name]  . Number of Children: N/[name]  . Years of Education: N/[name]   Occupational History  . Not on file.   Social History Main Topics  . Smoking status: Never Smoker   . Smokeless tobacco: Never Used  . Alcohol Use: No  . Drug Use: No  . Sexual Activity: Not on file   Other Topics Concern  . Not on file   Social History Narrative     Objective: BP 136/71 mmHg  Pulse 92  Ht  (1.676 m)  Wt 142 lb (64.411 kg)  BMI 22.93 kg/m2  General: Alert and Oriented, No Acute Distress HEENT: Pupils equal, round, reactive to light. Conjunctivae clear.    Moist mucous membranes Lungs: Clear to auscultation bilaterally, no wheezing/ronchi/rales.  Comfortable work of breathing. Good air movement. Cardiac: Regular rate and rhythm. Normal S1/S2.  No murmurs, rubs, nor gallops.   Extremities: No peripheral edema.  Strong peripheral pulses.  Mental Status: No depression, anxiety, nor agitation. Skin: Warm and dry.  Assessment & Plan: Veronica [name] was seen today for diabetes and medication follow up.  Diagnoses and all orders for this visit:  Hypothyroidism, unspecified hypothyroidism type -     TSH  Anxiety and depression -     citalopram (CELEXA) 40 MG tablet; Take 1 tablet (40 mg total) by mouth daily. -  clonazePAM (KLONOPIN) 0.5 MG tablet; One tablet twice [name] day and additional one tablet at bedtime for sleep.  Type 2 diabetes mellitus with microalbuminuria or microproteinuria  Type 2 diabetes mellitus without complication -     Linagliptin-Metformin HCl 2.06-998 MG TABS; One by mouth twice [name] day. -     pioglitazone (ACTOS) 30 MG tablet; Take 1 tablet (30 mg total) by mouth daily.  Other orders -     atorvastatin (LIPITOR) 10 MG tablet; Take 1 tablet (10 mg total) by mouth daily.    hypothyroidism: Clinically controlled  Due for repeat thyroid function, refills on levothyroxine will be based on this result  Anxiety and depression: Controlled with clonazepam and  citalopram  Type 2 diabetes: A1c of 7.0 , controlled, I'm going to try to get in touch with the [employer] rep  To see bleeding gets samples of Jentadueto  Since she's having to pay 300 dollars [name] month now that she is in the donut hole. Continue on this and Actos.  She needs to ensure that she is taking statin on [name] daily basis.  No Follow-up on file.

## 2014-10-07 ENCOUNTER — Telehealth: Payer: Self-pay | Admitting: Family Medicine

## 2014-10-07 MED ORDER — LEVOTHYROXINE SODIUM 75 MCG PO TABS: 75.0000 ug | ORAL_TABLET | Freq: Every day | ORAL | Status: DC

## 2014-10-07 NOTE — Telephone Encounter (Signed)
Called Veronica [name] and he states he has already sent a message out to have another rep come by

## 2014-10-07 NOTE — Telephone Encounter (Signed)
Dava Najjar, Please help with this request.

## 2014-10-07 NOTE — Telephone Encounter (Signed)
Pt.notified

## 2014-10-07 NOTE — Telephone Encounter (Signed)
Veronica [name], Will you please let patient know that her thyroid supplement appears to be adequate.  I've sent refills of her levothyroxine to [facility].

## 2014-10-09 NOTE — Telephone Encounter (Signed)
Samples are in office, Pt notified they are ready to be picked up. She was very grateful.

## 2014-10-28 ENCOUNTER — Ambulatory Visit (INDEPENDENT_AMBULATORY_CARE_PROVIDER_SITE_OTHER): Payer: Medicare HMO | Admitting: Family Medicine

## 2014-10-28 ENCOUNTER — Encounter: Payer: Self-pay | Admitting: Family Medicine

## 2014-10-28 VITALS — BP 137/86 | HR 104 | Wt 140.0 lb

## 2014-10-28 DIAGNOSIS — S99921A Unspecified injury of right foot, initial encounter: Secondary | ICD-10-CM

## 2014-10-28 MED ORDER — DOXYCYCLINE HYCLATE 100 MG PO TABS: ORAL_TABLET | ORAL | Status: AC

## 2014-10-28 NOTE — Progress Notes (Signed)
CC: Veronica Veronica [name] is Veronica [name] 74 y.o. female is here for check toe   Subjective: HPI:   On Saturday she hit her right great toenail into Veronica [name] rock. Since then she's had some slight pain and browning of the toenail. Interventions have included Neosporin. She is also keeping the foot clean. Symptoms only mild in severity. She is worried she might have an infection. She denies fevers, chills or swollen lymph nodes. Denies joint pain elsewhere.    Review Of Systems Outlined In HPI  Past Medical History  Diagnosis Date  . Hypothyroid   . Anemia     Past Surgical History  Procedure Laterality Date  . Cesarean section    . Tonsillectomy    . Laparoscopic appendectomy  03/03/2011    Procedure: APPENDECTOMY LAPAROSCOPIC;  Surgeon: Valarie Merino, MD;  Location: [facility];  Service: General;  Laterality: N/Veronica [name];  . Appendectomy  02/28/11   Family History  Problem Relation Age of Onset  . Cancer Mother     breast  . Cancer Father     axillary tumor  . Diabetes Mother   . Hypertension Mother   . Stroke Mother     Social History   Social History  . Marital Status: Married    Spouse Name: N/Veronica [name]  . Number of Children: N/Veronica [name]  . Years of Education: N/Veronica [name]   Occupational History  . Not on file.   Social History Main Topics  . Smoking status: Never Smoker   . Smokeless tobacco: Never Used  . Alcohol Use: No  . Drug Use: No  . Sexual Activity: Not on file   Other Topics Concern  . Not on file   Social History Narrative     Objective: BP 137/86 mmHg  Pulse 104  Wt 140 lb (63.504 kg)  Vital signs reviewed. General: Alert and Oriented, No Acute Distress HEENT: Pupils equal, round, reactive to light. Conjunctivae clear.  External ears unremarkable.  Moist mucous membranes. Lungs: Clear and comfortable work of breathing, speaking in full sentences without accessory muscle use. Cardiac: Regular rate and rhythm.  Neuro: CN II-XII grossly intact, gait normal. Extremities: No peripheral edema.   Strong peripheral pulses.  Mental Status: No depression, anxiety, nor agitation. Logical though process. Skin: Warm and dry. Medial aspect of the right great toe has some mild brown pigment underneath the nail and erythema along the medial nail bed.  Assessment & Plan: Veronica Veronica [name] was seen today for check toe.  Diagnoses and all orders for this visit:  Toe injury, right, initial encounter -     doxycycline (VIBRA-TABS) 100 MG tablet; One by mouth twice Veronica [name] day for ten days.   Toe injury: Even that she is Veronica [name] diabetic I would like for her to have an antibiotic to help prevent foot ulceration. She is intolerant to penicillin, cephalosporins and sulfa drugs. Will try doxycycline and Epsom salt baths.Signs and symptoms requring emergent/urgent reevaluation were discussed with the patient.  Return if symptoms worsen or fail to improve.

## 2014-11-19 ENCOUNTER — Telehealth: Payer: Self-pay | Admitting: Family Medicine

## 2014-11-19 NOTE — Telephone Encounter (Signed)
Pt called regarding samples for Jenadueto. Left voicemail for Rep Elijah Birk) at [phone] to see if he could bring some by per PCP request.

## 2014-11-20 NOTE — Telephone Encounter (Signed)
Rx samples came in, Pt notified and will come pick up.

## 2014-12-31 ENCOUNTER — Telehealth: Payer: Self-pay

## 2014-12-31 DIAGNOSIS — R809 Proteinuria, unspecified: Principal | ICD-10-CM

## 2014-12-31 DIAGNOSIS — E1129 Type 2 diabetes mellitus with other diabetic kidney complication: Secondary | ICD-10-CM

## 2014-12-31 NOTE — Telephone Encounter (Signed)
Notified. 

## 2014-12-31 NOTE — Telephone Encounter (Signed)
Pt would like for A1c to drawn Friday before he appointment on Monday.

## 2014-12-31 NOTE — Telephone Encounter (Signed)
Lab slip in your in box 

## 2015-01-02 LAB — HEMOGLOBIN A1C
HEMOGLOBIN A1C: 6.9 % — AB (ref <5.7)
MEAN PLASMA GLUCOSE: 151 mg/dL — AB (ref <117)

## 2015-01-05 ENCOUNTER — Ambulatory Visit (INDEPENDENT_AMBULATORY_CARE_PROVIDER_SITE_OTHER): Payer: Medicare HMO | Admitting: Family Medicine

## 2015-01-05 ENCOUNTER — Encounter: Payer: Self-pay | Admitting: Family Medicine

## 2015-01-05 VITALS — BP 142/81 | HR 70 | Wt 145.0 lb

## 2015-01-05 DIAGNOSIS — E119 Type 2 diabetes mellitus without complications: Secondary | ICD-10-CM | POA: Diagnosis not present

## 2015-01-05 DIAGNOSIS — R809 Proteinuria, unspecified: Secondary | ICD-10-CM | POA: Diagnosis not present

## 2015-01-05 DIAGNOSIS — K219 Gastro-esophageal reflux disease without esophagitis: Secondary | ICD-10-CM

## 2015-01-05 DIAGNOSIS — E1129 Type 2 diabetes mellitus with other diabetic kidney complication: Secondary | ICD-10-CM

## 2015-01-05 DIAGNOSIS — F418 Other specified anxiety disorders: Secondary | ICD-10-CM

## 2015-01-05 DIAGNOSIS — F32A Depression, unspecified: Secondary | ICD-10-CM

## 2015-01-05 DIAGNOSIS — F419 Anxiety disorder, unspecified: Secondary | ICD-10-CM

## 2015-01-05 DIAGNOSIS — F329 Major depressive disorder, single episode, unspecified: Secondary | ICD-10-CM

## 2015-01-05 HISTORY — DX: Gastro-esophageal reflux disease without esophagitis: K21.9

## 2015-01-05 MED ORDER — ATORVASTATIN CALCIUM 10 MG PO TABS: 10.0000 mg | ORAL_TABLET | Freq: Every day | ORAL | Status: DC

## 2015-01-05 MED ORDER — CITALOPRAM HYDROBROMIDE 40 MG PO TABS: 40.0000 mg | ORAL_TABLET | Freq: Every day | ORAL | Status: DC

## 2015-01-05 MED ORDER — PIOGLITAZONE HCL 30 MG PO TABS: 30.0000 mg | ORAL_TABLET | Freq: Every day | ORAL | Status: DC

## 2015-01-05 MED ORDER — LEVOTHYROXINE SODIUM 75 MCG PO TABS: 75.0000 ug | ORAL_TABLET | Freq: Every day | ORAL | Status: DC

## 2015-01-05 MED ORDER — CLONAZEPAM 0.5 MG PO TABS: ORAL_TABLET | ORAL | Status: DC

## 2015-01-05 MED ORDER — LINAGLIPTIN-METFORMIN HCL 2.5-1000 MG PO TABS: ORAL_TABLET | ORAL | Status: DC

## 2015-01-05 NOTE — Progress Notes (Signed)
CC: Veronica [name] is a 74 y.o. female is here for Hyperglycemia; lab results; Jaw Pain; left ear pain; and Gastroesophageal Reflux   Subjective: HPI:  Follow-up type 2 diabetes: She's been exercising 3 days out of the week since I saw her last. She's also been trying to cut back on carbohydrate intake. She is taking Actos and Linagliptin-Met on a daily basis. She's noticed fasting blood sugars ranging between 100- 120. There's been no polyuria or polyphagia or polydipsia  Follow-up anxiety depression: She tells me she gets a little bit anxious with her husband being on the road right now but symptoms are well-controlled with daily citalopram and clonazepam. She denies any depressive thoughts. She denies any other mental disturbance. No thoughts or and harm self or others.  Her major complaint today is a sensation of reflux in the back of her throat. It's absent during the day she takes ranitidine however it will occur at night especially when lying down. She denies any exertional component to her chest discomfort but there has been some acid sensation behind the sternum. She denies dysphagia, difficulty swallowing, regurgitation or unintentional weight loss.  Review Of Systems Outlined In HPI  Past Medical History  Diagnosis Date  . Hypothyroid   . Anemia     Past Surgical History  Procedure Laterality Date  . Cesarean section    . Tonsillectomy    . Laparoscopic appendectomy  03/03/2011    Procedure: APPENDECTOMY LAPAROSCOPIC;  Surgeon: Pedro Earls, MD;  Location: [facility];  Service: General;  Laterality: N/A;  . Appendectomy  02/28/11   Family History  Problem Relation Age of Onset  . Cancer Mother     breast  . Cancer Father     axillary tumor  . Diabetes Mother   . Hypertension Mother   . Stroke Mother     Social History   Social History  . Marital Status: Married    Spouse Name: N/A  . Number of Children: N/A  . Years of Education: N/A   Occupational History  .  Not on file.   Social History Main Topics  . Smoking status: Never Smoker   . Smokeless tobacco: Never Used  . Alcohol Use: No  . Drug Use: No  . Sexual Activity: Not on file   Other Topics Concern  . Not on file   Social History Narrative     Objective: BP 142/81 mmHg  Pulse 70  Wt 145 lb (65.772 kg)  General: Alert and Oriented, No Acute Distress HEENT: Pupils equal, round, reactive to light. Conjunctivae clear.  External ears unremarkable, canals clear with intact TMs with appropriate landmarks.  Middle ear appears open without effusion. Pink inferior turbinates.  Moist mucous membranes, pharynx without inflammation nor lesions.  Lungs: Clear comfortable work of breathing Cardiac: Regular rate and rhythm. Extremities: No peripheral edema.  Strong peripheral pulses.  Mental Status: No depression, anxiety, nor agitation. Skin: Warm and dry.  Assessment & Plan: Veronica [name] was seen today for hyperglycemia, lab results, jaw pain, left ear pain and gastroesophageal reflux.  Diagnoses and all orders for this visit:  Type 2 diabetes mellitus with microalbuminuria or microproteinuria  Gastroesophageal reflux disease, esophagitis presence not specified  Anxiety and depression -     citalopram (CELEXA) 40 MG tablet; Take 1 tablet (40 mg total) by mouth daily. -     clonazePAM (KLONOPIN) 0.5 MG tablet; One tablet twice a day and additional one tablet at bedtime for sleep.  Type 2 diabetes  mellitus without complication, without long-term current use of insulin (HCC) -     Linagliptin-Metformin HCl 2.06-998 MG TABS; One by mouth twice a day. -     pioglitazone (ACTOS) 30 MG tablet; Take 1 tablet (30 mg total) by mouth daily.  Other orders -     atorvastatin (LIPITOR) 10 MG tablet; Take 1 tablet (10 mg total) by mouth daily. -     levothyroxine (SYNTHROID, LEVOTHROID) 75 MCG tablet; Take 1 tablet (75 mcg total) by mouth daily.   Type 2 diabetes: A1c 6.9 control, continue Actos and  Linagliptin-Metformin daily. Congratulated her success with exercising Anxiety and depression: Controlled continue citalopram and as needed clonazepam GERD: Encouraged to take ranitidine 150 mg twice a day instead of just once in the morning.  Return in about 3 months (around 04/07/2015).

## 2015-03-17 ENCOUNTER — Ambulatory Visit (INDEPENDENT_AMBULATORY_CARE_PROVIDER_SITE_OTHER): Payer: Medicare HMO | Admitting: Family Medicine

## 2015-03-17 ENCOUNTER — Encounter: Payer: Self-pay | Admitting: Family Medicine

## 2015-03-17 VITALS — BP 139/88 | HR 85 | Wt 146.0 lb

## 2015-03-17 DIAGNOSIS — F418 Other specified anxiety disorders: Secondary | ICD-10-CM

## 2015-03-17 DIAGNOSIS — E1129 Type 2 diabetes mellitus with other diabetic kidney complication: Secondary | ICD-10-CM

## 2015-03-17 DIAGNOSIS — E119 Type 2 diabetes mellitus without complications: Secondary | ICD-10-CM

## 2015-03-17 DIAGNOSIS — F32A Depression, unspecified: Secondary | ICD-10-CM

## 2015-03-17 DIAGNOSIS — R809 Proteinuria, unspecified: Secondary | ICD-10-CM

## 2015-03-17 DIAGNOSIS — F419 Anxiety disorder, unspecified: Secondary | ICD-10-CM

## 2015-03-17 DIAGNOSIS — F329 Major depressive disorder, single episode, unspecified: Secondary | ICD-10-CM

## 2015-03-17 MED ORDER — ATORVASTATIN CALCIUM 10 MG PO TABS: 10.0000 mg | ORAL_TABLET | Freq: Every day | ORAL | Status: DC

## 2015-03-17 MED ORDER — LINAGLIPTIN 5 MG PO TABS: 5.0000 mg | ORAL_TABLET | Freq: Every day | ORAL | Status: DC

## 2015-03-17 MED ORDER — CITALOPRAM HYDROBROMIDE 40 MG PO TABS: 40.0000 mg | ORAL_TABLET | Freq: Every day | ORAL | Status: DC

## 2015-03-17 MED ORDER — CLONAZEPAM 0.5 MG PO TABS: ORAL_TABLET | ORAL | Status: DC

## 2015-03-17 MED ORDER — LEVOTHYROXINE SODIUM 75 MCG PO TABS: 75.0000 ug | ORAL_TABLET | Freq: Every day | ORAL | Status: DC

## 2015-03-17 MED ORDER — PIOGLITAZONE HCL 30 MG PO TABS: 30.0000 mg | ORAL_TABLET | Freq: Every day | ORAL | Status: DC

## 2015-03-17 NOTE — Progress Notes (Signed)
CC: Veronica [name] is a 75 y.o. female is here for Hyperglycemia; Gastroesophageal Reflux; and Diarrhea   Subjective: HPI:  Follow-up type 2 diabetes: She says that on the days that she takes jentadueto  She has loose stools. Symptoms are getting in the way of her quality of life. If she does not take this medication she does not get symptoms. She has no outside blood sugars to report. Denies polyuria polyphagia or polydipsia.    follow-up anxiety and depression: She is requesting refills on clonazepam since she anticipates that she'll be on the road for almost 180 days before she comes back [place].  She states that symptoms are well controlled taking this on a daily basis on an as-needed basis along with daily citalopram.    Review Of Systems Outlined In HPI  Past Medical History  Diagnosis Date  . Hypothyroid   . Anemia     Past Surgical History  Procedure Laterality Date  . Cesarean section    . Tonsillectomy    . Laparoscopic appendectomy  03/03/2011    Procedure: APPENDECTOMY LAPAROSCOPIC;  Surgeon: Valarie Merino, MD;  Location: [facility];  Service: General;  Laterality: N/A;  . Appendectomy  02/28/11   Family History  Problem Relation Age of Onset  . Cancer Mother     breast  . Cancer Father     axillary tumor  . Diabetes Mother   . Hypertension Mother   . Stroke Mother     Social History   Social History  . Marital Status: Married    Spouse Name: N/A  . Number of Children: N/A  . Years of Education: N/A   Occupational History  . Not on file.   Social History Main Topics  . Smoking status: Never Smoker   . Smokeless tobacco: Never Used  . Alcohol Use: No  . Drug Use: No  . Sexual Activity: Not on file   Other Topics Concern  . Not on file   Social History Narrative     Objective: BP 139/88 mmHg  Pulse 85  Wt 146 lb (66.225 kg)  Vital signs reviewed. General: Alert and Oriented, No Acute Distress HEENT: Pupils equal, round, reactive to  light. Conjunctivae clear.  External ears unremarkable.  Moist mucous membranes. Lungs: Clear and comfortable work of breathing, speaking in full sentences without accessory muscle use. Cardiac: Regular rate and rhythm.  Neuro: CN II-XII grossly intact, gait normal. Extremities: No peripheral edema.  Strong peripheral pulses.  Mental Status: No depression, anxiety, nor agitation. Logical though process. Skin: Warm and dry.  Assessment & Plan: Marga was seen today for hyperglycemia, gastroesophageal reflux and diarrhea.  Diagnoses and all orders for this visit:  Type 2 diabetes mellitus with microalbuminuria or microproteinuria -     Hemoglobin A1c  Anxiety and depression -     citalopram (CELEXA) 40 MG tablet; Take 1 tablet (40 mg total) by mouth daily. -     clonazePAM (KLONOPIN) 0.5 MG tablet; One tablet twice a day and additional one tablet at bedtime for sleep.  Type 2 diabetes mellitus without complication, without long-term current use of insulin (HCC) -     pioglitazone (ACTOS) 30 MG tablet; Take 1 tablet (30 mg total) by mouth daily.  Other orders -     atorvastatin (LIPITOR) 10 MG tablet; Take 1 tablet (10 mg total) by mouth daily. -     levothyroxine (SYNTHROID, LEVOTHROID) 75 MCG tablet; Take 1 tablet (75 mcg total) by mouth daily. -  linagliptin (TRADJENTA) 5 MG TABS tablet; Take 1 tablet (5 mg total) by mouth daily.  type 2 diabetes:controlled however getting side effects from metformin therefore cutting regimen to only Actos and tradjenta.  I given her an A1c lab slip to be done before she leaves sometime around February 10, no need to get this done until February.  Anxiety and depression:controlled with citalopram and clonazepam.  I told her that ideally it would be wise to follow up with me in 3 months after her next A1c for a repeat three-month A1c. If she is on the road the second best option would be to get an A1c at lab of her choice, I can fax orders if  needed. she would like to know if I can set up a visit in the future where I call her during office hours almost like tele-medicine.  I prepared her that if my schedule is booked on the day that she wants to call I will be unable to take a phone call and questions should be directed through my assistance.   Return in about 3 months (around 06/15/2015).

## 2015-03-18 ENCOUNTER — Telehealth: Payer: Self-pay | Admitting: Family Medicine

## 2015-03-18 NOTE — Telephone Encounter (Signed)
Received fax for prior authorization on Tradjenta sent through cover my meds waiting on authorization. - CF

## 2015-03-19 NOTE — Telephone Encounter (Signed)
Received fax from [facility] and Jearld Lesch is approved until 03/17/2017. - CF

## 2015-03-20 ENCOUNTER — Ambulatory Visit (INDEPENDENT_AMBULATORY_CARE_PROVIDER_SITE_OTHER): Payer: Medicare PPO | Admitting: Ophthalmology

## 2015-03-20 ENCOUNTER — Telehealth: Payer: Self-pay

## 2015-03-20 NOTE — Telephone Encounter (Signed)
Husband notified  

## 2015-03-20 NOTE — Telephone Encounter (Signed)
Pt reports that she is having cold Sx: fever, post nasal drip, runny nose, sore throat, ear fullness, and hoarseness. Should she be seen or could something be sent to the pharmacy? ([facility])

## 2015-03-20 NOTE — Telephone Encounter (Signed)
If Mucinex D and Ibuprofen  do not help I would recommend scheduling an office visit or going to urgent care over the weekend.

## 2015-03-23 ENCOUNTER — Encounter: Payer: Self-pay | Admitting: Family Medicine

## 2015-03-23 ENCOUNTER — Ambulatory Visit (INDEPENDENT_AMBULATORY_CARE_PROVIDER_SITE_OTHER): Payer: Medicare PPO | Admitting: Ophthalmology

## 2015-03-23 DIAGNOSIS — E113293 Type 2 diabetes mellitus with mild nonproliferative diabetic retinopathy without macular edema, bilateral: Secondary | ICD-10-CM | POA: Diagnosis not present

## 2015-03-23 DIAGNOSIS — E11319 Type 2 diabetes mellitus with unspecified diabetic retinopathy without macular edema: Secondary | ICD-10-CM

## 2015-03-23 DIAGNOSIS — H43813 Vitreous degeneration, bilateral: Secondary | ICD-10-CM

## 2015-03-23 DIAGNOSIS — H33303 Unspecified retinal break, bilateral: Secondary | ICD-10-CM

## 2015-03-23 HISTORY — DX: Type 2 diabetes mellitus with unspecified diabetic retinopathy without macular edema: E11.319

## 2015-03-30 LAB — HEMOGLOBIN A1C
Hgb A1c MFr Bld: 7.7 % — ABNORMAL HIGH (ref <5.7)
MEAN PLASMA GLUCOSE: 174 mg/dL — AB (ref <117)

## 2015-03-31 ENCOUNTER — Ambulatory Visit (INDEPENDENT_AMBULATORY_CARE_PROVIDER_SITE_OTHER): Payer: Medicare HMO | Admitting: Family Medicine

## 2015-03-31 ENCOUNTER — Encounter: Payer: Self-pay | Admitting: Family Medicine

## 2015-03-31 VITALS — BP 146/88 | HR 69 | Wt 146.0 lb

## 2015-03-31 DIAGNOSIS — E119 Type 2 diabetes mellitus without complications: Secondary | ICD-10-CM

## 2015-03-31 DIAGNOSIS — R030 Elevated blood-pressure reading, without diagnosis of hypertension: Secondary | ICD-10-CM

## 2015-03-31 DIAGNOSIS — E1129 Type 2 diabetes mellitus with other diabetic kidney complication: Secondary | ICD-10-CM

## 2015-03-31 DIAGNOSIS — IMO0001 Reserved for inherently not codable concepts without codable children: Secondary | ICD-10-CM

## 2015-03-31 DIAGNOSIS — R809 Proteinuria, unspecified: Secondary | ICD-10-CM

## 2015-03-31 MED ORDER — PIOGLITAZONE HCL 45 MG PO TABS: 45.0000 mg | ORAL_TABLET | Freq: Every day | ORAL | Status: DC

## 2015-03-31 NOTE — Progress Notes (Signed)
CC: Veronica [name] is a 75 y.o. female is here for Hyperglycemia   Subjective: HPI:  Follow-up type 2 diabetes: Tolerating Actos and new prescription of tradjenta. Since eliminating the metformin component shows no longer having any abdominal pain or loose stools. She has no complaints today. She is trying to stick to a low carb diet and admits that she occasionally will eat soups and products that are likely high in sodium. She denies polyuria polyphagia or polydipsia. No outside blood sugars to report.  Elevated blood pressure: On a few visits in the past she's had elevated blood pressure. Again today it's in the stage I hypertension range. She tells me that over the past week she's had to cut out exercising due to now resolved sinus infection. She denies chest pain shortness of breath orthopnea nor peripheral edema.   Review Of Systems Outlined In HPI  Past Medical History  Diagnosis Date  . Hypothyroid   . Anemia     Past Surgical History  Procedure Laterality Date  . Cesarean section    . Tonsillectomy    . Laparoscopic appendectomy  03/03/2011    Procedure: APPENDECTOMY LAPAROSCOPIC;  Surgeon: Valarie Merino, MD;  Location: [facility];  Service: General;  Laterality: N/A;  . Appendectomy  02/28/11   Family History  Problem Relation Age of Onset  . Cancer Mother     breast  . Cancer Father     axillary tumor  . Diabetes Mother   . Hypertension Mother   . Stroke Mother     Social History   Social History  . Marital Status: Married    Spouse Name: N/A  . Number of Children: N/A  . Years of Education: N/A   Occupational History  . Not on file.   Social History Main Topics  . Smoking status: Never Smoker   . Smokeless tobacco: Never Used  . Alcohol Use: No  . Drug Use: No  . Sexual Activity: Not on file   Other Topics Concern  . Not on file   Social History Narrative     Objective: BP 146/88 mmHg  Pulse 69  Wt 146 lb (66.225 kg)  Vital signs  reviewed. General: Alert and Oriented, No Acute Distress HEENT: Pupils equal, round, reactive to light. Conjunctivae clear.  External ears unremarkable.  Moist mucous membranes. Lungs: Clear and comfortable work of breathing, speaking in full sentences without accessory muscle use. Cardiac: Regular rate and rhythm.  Neuro: CN II-XII grossly intact, gait normal. Extremities: No peripheral edema.  Strong peripheral pulses.  Mental Status: No depression, anxiety, nor agitation. Logical though process. Skin: Warm and dry. Assessment & Plan: Veronica [name] was seen today for hyperglycemia.  Diagnoses and all orders for this visit:  Type 2 diabetes mellitus with microalbuminuria or microproteinuria  Type 2 diabetes mellitus without complication, without long-term current use of insulin (HCC)  Other orders -     pioglitazone (ACTOS) 45 MG tablet; Take 1 tablet (45 mg total) by mouth daily.   Type 2 DM; A1c of 7.7, uncontrolled, increasing Actos Elevated BP: Uncontrolled in the Stage I HTN range,  We are both opimistic this can be better controlled with reducing broth intake and returning to her exercise reigmen. Recheck in three months.   Return if symptoms worsen or fail to improve.

## 2015-04-01 ENCOUNTER — Ambulatory Visit: Payer: Self-pay | Admitting: Family Medicine

## 2015-06-24 ENCOUNTER — Other Ambulatory Visit: Payer: Self-pay | Admitting: Family Medicine

## 2015-06-24 DIAGNOSIS — F329 Major depressive disorder, single episode, unspecified: Secondary | ICD-10-CM

## 2015-06-24 DIAGNOSIS — F32A Depression, unspecified: Secondary | ICD-10-CM

## 2015-06-24 DIAGNOSIS — F419 Anxiety disorder, unspecified: Principal | ICD-10-CM

## 2015-06-24 MED ORDER — CLONAZEPAM 0.5 MG PO TABS: ORAL_TABLET | ORAL | Status: DC

## 2015-06-24 NOTE — Telephone Encounter (Signed)
[place], Rx placed in in-box ready for pickup/faxing.  

## 2015-06-24 NOTE — Telephone Encounter (Signed)
Pt states she is in [place] now and would like her refill sent to [facility] there. Will route to PCP for reveiw.

## 2015-06-24 NOTE — Telephone Encounter (Signed)
Rx faxed

## 2015-07-02 ENCOUNTER — Telehealth: Payer: Self-pay | Admitting: *Deleted

## 2015-07-02 DIAGNOSIS — R809 Proteinuria, unspecified: Principal | ICD-10-CM

## 2015-07-02 DIAGNOSIS — E1129 Type 2 diabetes mellitus with other diabetic kidney complication: Secondary | ICD-10-CM

## 2015-07-02 NOTE — Telephone Encounter (Addendum)
Patient scheduled an A1c in [place]. Needs lab [phone] fax [facility] [phone]   Pt states she has had a sinus infection for a month.She has been seen at a  azithromyacin 500 mg once a day. She would like something called in  [facility] [place] [phone]

## 2015-07-06 NOTE — Telephone Encounter (Signed)
Pt.notified

## 2015-07-06 NOTE — Telephone Encounter (Signed)
Veronica [name], Lab slip in your in box, office policy requires a face to face encounter for antibiotic requests.

## 2015-09-17 ENCOUNTER — Other Ambulatory Visit: Payer: Self-pay

## 2015-09-17 DIAGNOSIS — F419 Anxiety disorder, unspecified: Principal | ICD-10-CM

## 2015-09-17 DIAGNOSIS — F329 Major depressive disorder, single episode, unspecified: Secondary | ICD-10-CM

## 2015-09-17 DIAGNOSIS — F32A Depression, unspecified: Secondary | ICD-10-CM

## 2015-09-17 LAB — HEMOGLOBIN A1C: Hemoglobin A1C: 10.4

## 2015-09-17 MED ORDER — LEVOTHYROXINE SODIUM 75 MCG PO TABS: 75.0000 ug | ORAL_TABLET | Freq: Every day | ORAL | 0 refills | Status: DC

## 2015-09-17 MED ORDER — LINAGLIPTIN 5 MG PO TABS: 5.0000 mg | ORAL_TABLET | Freq: Every day | ORAL | 0 refills | Status: DC

## 2015-09-17 MED ORDER — CITALOPRAM HYDROBROMIDE 40 MG PO TABS: 40.0000 mg | ORAL_TABLET | Freq: Every day | ORAL | 0 refills | Status: DC

## 2015-09-17 MED ORDER — ATORVASTATIN CALCIUM 10 MG PO TABS: 10.0000 mg | ORAL_TABLET | Freq: Every day | ORAL | 0 refills | Status: DC

## 2015-09-17 MED ORDER — PIOGLITAZONE HCL 45 MG PO TABS: 45.0000 mg | ORAL_TABLET | Freq: Every day | ORAL | 0 refills | Status: DC

## 2015-09-17 MED ORDER — CLONAZEPAM 0.5 MG PO TABS: ORAL_TABLET | ORAL | 0 refills | Status: DC

## 2015-09-18 ENCOUNTER — Other Ambulatory Visit: Payer: Self-pay

## 2015-09-18 DIAGNOSIS — F32A Depression, unspecified: Secondary | ICD-10-CM

## 2015-09-18 DIAGNOSIS — F329 Major depressive disorder, single episode, unspecified: Secondary | ICD-10-CM

## 2015-09-18 DIAGNOSIS — F419 Anxiety disorder, unspecified: Principal | ICD-10-CM

## 2015-09-18 MED ORDER — CLONAZEPAM 0.5 MG PO TABS: ORAL_TABLET | ORAL | 0 refills | Status: DC

## 2015-09-18 MED ORDER — LEVOTHYROXINE SODIUM 75 MCG PO TABS: 75.0000 ug | ORAL_TABLET | Freq: Every day | ORAL | 0 refills | Status: DC

## 2015-09-18 MED ORDER — LINAGLIPTIN 5 MG PO TABS: 5.0000 mg | ORAL_TABLET | Freq: Every day | ORAL | 0 refills | Status: DC

## 2015-09-18 MED ORDER — PIOGLITAZONE HCL 45 MG PO TABS: 45.0000 mg | ORAL_TABLET | Freq: Every day | ORAL | 0 refills | Status: DC

## 2015-09-18 MED ORDER — ATORVASTATIN CALCIUM 10 MG PO TABS: 10.0000 mg | ORAL_TABLET | Freq: Every day | ORAL | 0 refills | Status: DC

## 2015-09-18 MED ORDER — CITALOPRAM HYDROBROMIDE 40 MG PO TABS: 40.0000 mg | ORAL_TABLET | Freq: Every day | ORAL | 0 refills | Status: DC

## 2015-09-21 ENCOUNTER — Telehealth: Payer: Self-pay | Admitting: Family Medicine

## 2015-09-21 MED ORDER — EMPAGLIFLOZIN-LINAGLIPTIN 10-5 MG PO TABS: 1.0000 | ORAL_TABLET | Freq: Every day | ORAL | 1 refills | Status: DC

## 2015-09-21 NOTE — Telephone Encounter (Signed)
Will you please let patient know that with her A1c elevated at 10.4 I'd recommend she change her current diabetic regimen.  The next time she runs out of Tradjenta I'd recommend she switch to a different diabetes medication called Glyxambi that I'll send to the [facility].  A repeat A1c will be needed three months after this change. Continue with pioglitazone.

## 2015-09-22 NOTE — Telephone Encounter (Signed)
Pt notified Switching care to Dr. Lyn Hollingshead

## 2015-10-13 ENCOUNTER — Encounter: Payer: Self-pay | Admitting: Osteopathic Medicine

## 2015-10-27 ENCOUNTER — Encounter: Payer: Self-pay | Admitting: Family Medicine

## 2015-10-27 ENCOUNTER — Ambulatory Visit (INDEPENDENT_AMBULATORY_CARE_PROVIDER_SITE_OTHER): Payer: Medicare HMO | Admitting: Family Medicine

## 2015-10-27 ENCOUNTER — Ambulatory Visit (INDEPENDENT_AMBULATORY_CARE_PROVIDER_SITE_OTHER): Payer: Medicare PPO

## 2015-10-27 VITALS — BP 147/88 | HR 78 | Wt 148.0 lb

## 2015-10-27 DIAGNOSIS — E119 Type 2 diabetes mellitus without complications: Secondary | ICD-10-CM | POA: Diagnosis not present

## 2015-10-27 DIAGNOSIS — M25561 Pain in right knee: Secondary | ICD-10-CM

## 2015-10-27 DIAGNOSIS — M67431 Ganglion, right wrist: Secondary | ICD-10-CM

## 2015-10-27 DIAGNOSIS — M25562 Pain in left knee: Secondary | ICD-10-CM | POA: Diagnosis not present

## 2015-10-27 DIAGNOSIS — G8929 Other chronic pain: Secondary | ICD-10-CM

## 2015-10-27 DIAGNOSIS — M5117 Intervertebral disc disorders with radiculopathy, lumbosacral region: Secondary | ICD-10-CM | POA: Diagnosis not present

## 2015-10-27 DIAGNOSIS — M5431 Sciatica, right side: Secondary | ICD-10-CM | POA: Diagnosis not present

## 2015-10-27 DIAGNOSIS — M67439 Ganglion, unspecified wrist: Secondary | ICD-10-CM | POA: Insufficient documentation

## 2015-10-27 DIAGNOSIS — Z23 Encounter for immunization: Secondary | ICD-10-CM | POA: Diagnosis not present

## 2015-10-27 DIAGNOSIS — E039 Hypothyroidism, unspecified: Secondary | ICD-10-CM | POA: Diagnosis not present

## 2015-10-27 DIAGNOSIS — M25461 Effusion, right knee: Secondary | ICD-10-CM | POA: Diagnosis not present

## 2015-10-27 DIAGNOSIS — M5116 Intervertebral disc disorders with radiculopathy, lumbar region: Secondary | ICD-10-CM | POA: Diagnosis not present

## 2015-10-27 DIAGNOSIS — R809 Proteinuria, unspecified: Secondary | ICD-10-CM

## 2015-10-27 DIAGNOSIS — E1129 Type 2 diabetes mellitus with other diabetic kidney complication: Secondary | ICD-10-CM

## 2015-10-27 LAB — POCT GLYCOSYLATED HEMOGLOBIN (HGB A1C): Hemoglobin A1C: 9.4

## 2015-10-27 MED ORDER — DULAGLUTIDE 0.75 MG/0.5ML ~~LOC~~ SOAJ: SUBCUTANEOUS | 5 refills | Status: DC

## 2015-10-27 MED ORDER — METHYLPREDNISOLONE ACETATE 80 MG/ML IJ SUSP
80.0000 mg | Freq: Once | INTRAMUSCULAR | Status: AC
  Administered 2015-10-27: 80 mg via INTRAMUSCULAR

## 2015-10-27 MED ORDER — CARISOPRODOL 350 MG PO TABS: 350.0000 mg | ORAL_TABLET | Freq: Three times a day (TID) | ORAL | 0 refills | Status: DC | PRN

## 2015-10-27 NOTE — Progress Notes (Signed)
CC: Veronica [name] is a 75 y.o. female is here for Hyperglycemia; Cyst; and Hip Pain   Subjective: HPI:  Follow-up hypothyroidism: Taking levothyroxine with 100% compliance. No intentional weight loss or gain nor any gastrointestinal complaints. Has not had TSH checked in over a year.  Follow-up type 2 diabetes: She was unable to afford Glyxambi she's currently taking some Actos and left upper metformin. She's no longer having any diarrhea with metformin. She is uncertain of her dose. When her A1c was 10 her blood sugar was ranging in the low 200s, it's been ranging in the low 100s ever since starting on metformin. She heard about an injectable medication that is taken once a week and she wants to know if she can look into this. Denies vision disturbance or polydipsia  For the past 4 months she's been complaining of right-sided buttock pain that radiates down the back of the leg into the back of the heel. It's described as a burning sensation and worse when sitting. No benefit from Aleve, ibuprofen or aspirin. She's also tried tramadol but didn't provide much benefit. She's had this years ago and it went away with some form of a "steroid shot". She denies any motor or sensory disturbances in the lower extremity. She also has a component of pain on the medial aspect of her knee that's been present ever since she jumped off a 4 foot ledge a few months ago.  Swelling of the right wrist, localize on the lateral aspect of the wrist and accompanied by some pain deep in the wrist. The pain does not seem to fluctuate. It is not tender at the site of the swelling. She denies any other overlying skin changes.    Review Of Systems Outlined In HPI  Past Medical History:  Diagnosis Date  . Anemia   . Hypothyroid     Past Surgical History:  Procedure Laterality Date  . APPENDECTOMY  02/28/11  . CESAREAN SECTION    . LAPAROSCOPIC APPENDECTOMY  03/03/2011   Procedure: APPENDECTOMY LAPAROSCOPIC;  Surgeon:  Valarie Merino, MD;  Location: [facility];  Service: General;  Laterality: N/A;  . TONSILLECTOMY     Family History  Problem Relation Age of Onset  . Cancer Mother     breast  . Cancer Father     axillary tumor  . Diabetes Mother   . Hypertension Mother   . Stroke Mother     Social History   Social History  . Marital status: Married    Spouse name: N/A  . Number of children: N/A  . Years of education: N/A   Occupational History  . Not on file.   Social History Main Topics  . Smoking status: Never Smoker  . Smokeless tobacco: Never Used  . Alcohol use No  . Drug use: No  . Sexual activity: Not on file   Other Topics Concern  . Not on file   Social History Narrative  . No narrative on file     Objective: BP (!) 147/88   Pulse 78   Wt 148 lb (67.1 kg)   BMI 23.89 kg/m   General: Alert and Oriented, No Acute Distress HEENT: Pupils equal, round, reactive to light. Conjunctivae clear.  Lungs: Clear to auscultation bilaterally, no wheezing/ronchi/rales.  Comfortable work of breathing. Good air movement. Cardiac: Regular rate and rhythm. Normal S1/S2.  No murmurs, rubs, nor gallops.   Extremities: No peripheral edema.  Strong peripheral pulses. Approximately 1 cm diameter nontender ganglion cyst of  the radiocarpal joint on the right wrist. No palpable pain on the joint line of the right knee. Mental Status: No depression, anxiety, nor agitation. Skin: Warm and dry.  Assessment & Plan: Veronica [name] was seen today for hyperglycemia, cyst and hip pain.  Diagnoses and all orders for this visit:  Hypothyroidism, unspecified hypothyroidism type -     TSH  Type 2 diabetes mellitus with microalbuminuria or microproteinuria -     Dulaglutide (TRULICITY) 0.75 MG/0.5ML SOPN; One 0.75mg  injection weekly  Sciatica of right side -     carisoprodol (SOMA) 350 MG tablet; Take 1 tablet (350 mg total) by mouth 3 (three) times daily as needed (musculo-skeletal pain).  Ganglion cyst  of wrist, right  Right knee pain -     DG Knee Complete 4 Views Right   Hypothyroidism: Due for TSH, refilling levothyroxine based on this result type 2 diabetes: A1c of 9 today, still uncontrolled, will look into whether or not her insurance will cover Trulicity, please call later today or tomorrow with the current dose of metformin   sciatica: Depo-Medrol shot today with as needed use of soma Right knee pain: Obtain x-ray today Ganglion cyst of the wrist: I consulted Dr. Denyse Amassorey in sports medicine for consideration of drainage.  Discussed with this patient that I will be resigning from my position here with [facility] in September in order to stay with my family [place]. I let him know about the providers that are still accepting patients and I feel that this individual will be under great care if he/she stays here with [facility].   Return in about 3 months (around 01/26/2016) for Repeat A1c.

## 2015-10-27 NOTE — Addendum Note (Signed)
Addended by: Thom ChimesHENRY, Keevan Wolz M on: 10/27/2015 11:53 AM   Modules accepted: Orders

## 2015-10-27 NOTE — Patient Instructions (Signed)
Thank you for coming in today. Get xray back today.  Call or go to the ER if you develop a large red swollen joint with extreme pain or oozing puss.  Take it easy with the wrist for a few days.    Ganglion Cyst A ganglion cyst is a noncancerous, fluid-filled lump that occurs near joints or tendons. The ganglion cyst grows out of a joint or the lining of a tendon. It most often develops in the hand or wrist, but it can also develop in the shoulder, elbow, hip, knee, ankle, or foot. The round or oval ganglion cyst can be the size of a pea or larger than a grape. Increased activity may enlarge the size of the cyst because more fluid starts to build up.  CAUSES It is not known what causes a ganglion cyst to grow. However, it may be related to:  Inflammation or irritation around the joint.  An injury.  Repetitive movements or overuse.  Arthritis. RISK FACTORS Risk factors include:  Being a woman.  Being age 77-50. SIGNS AND SYMPTOMS Symptoms may include:   A lump. This most often appears on the hand or wrist, but it can occur in other areas of the body.  Tingling.  Pain.  Numbness.  Muscle weakness.  Weak grip.  Less movement in a joint. DIAGNOSIS Ganglion cysts are most often diagnosed based on a physical exam. Your health care provider will feel the lump and may shine a light alongside it. If it is a ganglion cyst, a light often shines through it. Your health care provider may order an X-ray, ultrasound, or MRI to rule out other conditions. TREATMENT Ganglion cysts usually go away on their own without treatment. If pain or other symptoms are involved, treatment may be needed. Treatment is also needed if the ganglion cyst limits your movement or if it gets infected. Treatment may include:  Wearing a brace or splint on your wrist or finger.  Taking anti-inflammatory medicine.  Draining fluid from the lump with a needle (aspiration).  Injecting a steroid into the  joint.  Surgery to remove the ganglion cyst. HOME CARE INSTRUCTIONS  Do not press on the ganglion cyst, poke it with a needle, or hit it.  Take medicines only as directed by your health care provider.  Wear your brace or splint as directed by your health care provider.  Watch your ganglion cyst for any changes.  Keep all follow-up visits as directed by your health care provider. This is important. SEEK MEDICAL CARE IF:  Your ganglion cyst becomes larger or more painful.  You have increased redness, red streaks, or swelling.  You have pus coming from the lump.  You have weakness or numbness in the affected area.  You have a fever or chills.   This information is not intended to replace advice given to you by your health care provider. Make sure you discuss any questions you have with your health care provider.   Document Released: 02/05/2000 Document Revised: 02/28/2014 Document Reviewed: 07/23/2013 Elsevier Interactive Patient Education [employer]2016 Elsevier Inc.

## 2015-10-27 NOTE — Progress Notes (Addendum)
Veronica [name] is a 75 y.o. female who presents to [facility] today for right wrist ganglion cyst. Patient has had a bothersome getting the cyst in the right wrist present for 2 months. She denies any injury. No radiating pain weakness or numbness. She's tried rest ice which have not helped.  Additionally she notes right knee and low back pain. The knee pain is been present for a few months without injury. Pain is worse with extension and better with rest. Pain is predominantly felt along the medial aspect of the knee.  Additionally patient notes low back pain radiating to the right shin. She's had this evaluated in urgent care with a negative DVT ultrasound. She suspects this is sciatica.   Past Medical History:  Diagnosis Date  . Anemia   . Hypothyroid    Past Surgical History:  Procedure Laterality Date  . APPENDECTOMY  02/28/11  . CESAREAN SECTION    . LAPAROSCOPIC APPENDECTOMY  03/03/2011   Procedure: APPENDECTOMY LAPAROSCOPIC;  Surgeon: Valarie Merino, MD;  Location: [facility];  Service: General;  Laterality: N/A;  . TONSILLECTOMY     Social History  Substance Use Topics  . Smoking status: Never Smoker  . Smokeless tobacco: Never Used  . Alcohol use No   family history includes Cancer in her father and mother; Diabetes in her mother; Hypertension in her mother; Stroke in her mother.  ROS:  No headache, visual changes, nausea, vomiting, diarrhea, constipation, dizziness, abdominal pain, skin rash, fevers, chills, night sweats, weight loss, swollen lymph nodes, body aches, joint swelling, muscle aches, chest pain, shortness of breath, mood changes, visual or auditory hallucinations.    Medications: Current Outpatient Prescriptions  Medication Sig Dispense Refill  . AMBULATORY NON FORMULARY MEDICATION Accucheck  Aviva lancets and test strips    . aspirin 81 MG tablet Take 1 tablet (81 mg total) by mouth daily. 30 tablet   .  atorvastatin (LIPITOR) 10 MG tablet Take 1 tablet (10 mg total) by mouth daily. 90 tablet 0  . carisoprodol (SOMA) 350 MG tablet Take 1 tablet (350 mg total) by mouth 3 (three) times daily as needed (musculo-skeletal pain). 20 tablet 0  . citalopram (CELEXA) 40 MG tablet Take 1 tablet (40 mg total) by mouth daily. 90 tablet 0  . clonazePAM (KLONOPIN) 0.5 MG tablet One tablet twice a day and additional one tablet at bedtime for sleep. 270 tablet 0  . Dulaglutide (TRULICITY) 0.75 MG/0.5ML SOPN One 0.75mg  injection weekly 4 pen 5  . levothyroxine (SYNTHROID, LEVOTHROID) 75 MCG tablet Take 1 tablet (75 mcg total) by mouth daily. 90 tablet 0  . pioglitazone (ACTOS) 45 MG tablet Take 1 tablet (45 mg total) by mouth daily. 90 tablet 0   No current facility-administered medications for this visit.    Allergies  Allergen Reactions  . Codeine Hives and Nausea And Vomiting  . Glimepiride Hives  . Keflex [Cephalexin]     hives  . Metformin And Related     diarrhea  . Penicillins   . Septra [Sulfamethoxazole-Trimethoprim] Hives  . Sulfa Antibiotics Hives and Nausea And Vomiting     Exam:  There were no vitals taken for this visit. General: Well Developed, well nourished, and in no acute distress.  Neuro/Psych: Alert and oriented x3, extra-ocular muscles intact, able to move all 4 extremities, sensation grossly intact. Skin: Warm and dry, no rashes noted.  Respiratory: Not using accessory muscles, speaking in full sentences, trachea midline.  Cardiovascular:  Pulses palpable, no extremity edema. Abdomen: Does not appear distended. MSK: Right wrist with 1 cm nodule overlying the radial styloid. Nontender. Non-mobile. Pulses Refill sensation and grip strength are intact. Wrist motion is intact.  Right knee: Normal-appearing normal motion. 1+ patellar crepitations on extension. Tender palpation medial joint line. Stable ligamentous exam.  Neck: Nontender normal motion negative slump  test. Antalgic gait   Limited musculoskeletal ultrasound the right wrist:  Ultrasound probe placed over the nodule. 1 cm simple cystic hypoechoic lesion visible overlying the extensor tendons. Normal bony structures. No Doppler flow. Consistent with ganglion cyst.   Aspiration and injection of ganglion cyst:  Consent obtained and timeout performed Skin cleaned with alcohol and cold spray applied. 1 mL of lidocaine without epinephrine injected superficially to the nodule achieving good anesthesia. Skin cleaned with chlorhexidine 18-gauge daily as inserted into the cystic structure and clear gelatinous material was aspirated. A 25-gauge needle was inserted into the deflated cyst and 2.5 mg of dexamethasone was injected. A Band-Aid was applied. Patient tolerated procedure well.  Lot Number Dexamethasone: [id number]  No results found for this or any previous visit (from the past 24 hour(s)). No results found.   Assessment and Plan: 75 y.o. female with  1) right wrist ganglion cyst: Aspirated injection today. Recheck in a week or 2  2) right knee pain: Suspect DJD. X-ray pending  3) history of right lumbar radiculopathy. Lumbar x-ray pending. Suspect L5 nerve.   Discussed warning signs or symptoms. Please see discharge instructions. Patient expresses understanding.

## 2015-10-27 NOTE — Addendum Note (Signed)
Addended by: Thom ChimesHENRY, Randye Treichler M on: 10/27/2015 11:44 AM   Modules accepted: Orders

## 2015-11-02 ENCOUNTER — Encounter: Payer: Self-pay | Admitting: Osteopathic Medicine

## 2015-11-02 ENCOUNTER — Ambulatory Visit (INDEPENDENT_AMBULATORY_CARE_PROVIDER_SITE_OTHER): Payer: Medicare PPO | Admitting: Osteopathic Medicine

## 2015-11-02 ENCOUNTER — Ambulatory Visit (INDEPENDENT_AMBULATORY_CARE_PROVIDER_SITE_OTHER): Payer: Medicare PPO | Admitting: Family Medicine

## 2015-11-02 ENCOUNTER — Encounter: Payer: Self-pay | Admitting: Family Medicine

## 2015-11-02 VITALS — BP 143/97 | HR 93

## 2015-11-02 VITALS — BP 143/97 | HR 93 | Ht 67.0 in | Wt 142.0 lb

## 2015-11-02 DIAGNOSIS — R809 Proteinuria, unspecified: Secondary | ICD-10-CM | POA: Diagnosis not present

## 2015-11-02 DIAGNOSIS — E119 Type 2 diabetes mellitus without complications: Secondary | ICD-10-CM | POA: Diagnosis not present

## 2015-11-02 DIAGNOSIS — E1129 Type 2 diabetes mellitus with other diabetic kidney complication: Secondary | ICD-10-CM

## 2015-11-02 DIAGNOSIS — F32A Depression, unspecified: Secondary | ICD-10-CM

## 2015-11-02 DIAGNOSIS — E039 Hypothyroidism, unspecified: Secondary | ICD-10-CM | POA: Diagnosis not present

## 2015-11-02 DIAGNOSIS — M25561 Pain in right knee: Secondary | ICD-10-CM | POA: Diagnosis not present

## 2015-11-02 DIAGNOSIS — F418 Other specified anxiety disorders: Secondary | ICD-10-CM | POA: Diagnosis not present

## 2015-11-02 DIAGNOSIS — F419 Anxiety disorder, unspecified: Secondary | ICD-10-CM

## 2015-11-02 DIAGNOSIS — F329 Major depressive disorder, single episode, unspecified: Secondary | ICD-10-CM

## 2015-11-02 MED ORDER — DICLOFENAC SODIUM 1 % TD GEL: 4.0000 g | Freq: Four times a day (QID) | TRANSDERMAL | 11 refills | Status: DC

## 2015-11-02 NOTE — Progress Notes (Signed)
Veronica [name] is a 75 y.o. female who presents to [facility] today for follow-up ganglion cyst wrist and discuss right knee pain.  Patient was seen last week for right ganglion cyst and had ultrasound-guided aspiration and injection. She notes this is significantly improved however she still notes mild pain.  She does however note right knee pain and swelling. This limits her ability to exercise. She denies any locking catching or giving way. She does note some mild pain radiating down her calf with some activities but most the time is nonradiating. No fevers or chills nausea vomiting or diarrhea.     Past Medical History:  Diagnosis Date  . Anemia   . Diabetic retinopathy (HCC) 03/23/2015  . GERD (gastroesophageal reflux disease) 01/05/2015  . Hypothyroid   . Osteopenia 03/03/2014  . Type 2 diabetes mellitus with microalbuminuria or microproteinuria 03/03/2014   Actos added January 2016.     Past Surgical History:  Procedure Laterality Date  . APPENDECTOMY  02/28/11  . CESAREAN SECTION    . LAPAROSCOPIC APPENDECTOMY  03/03/2011   Procedure: APPENDECTOMY LAPAROSCOPIC;  Surgeon: Valarie Merino, MD;  Location: [facility];  Service: General;  Laterality: N/A;  . TONSILLECTOMY     Social History  Substance Use Topics  . Smoking status: Never Smoker  . Smokeless tobacco: Never Used  . Alcohol use No   family history includes Cancer in her father and mother; Diabetes in her mother; Hypertension in her mother; Stroke in her mother.  ROS as above:  Medications: Current Outpatient Prescriptions  Medication Sig Dispense Refill  . AMBULATORY NON FORMULARY MEDICATION Accucheck  Aviva lancets and test strips    . aspirin 81 MG tablet Take 1 tablet (81 mg total) by mouth daily. 30 tablet   . atorvastatin (LIPITOR) 10 MG tablet Take 1 tablet (10 mg total) by mouth daily. 90  tablet 0  . carisoprodol (SOMA) 350 MG tablet Take 1 tablet (350 mg total) by mouth 3 (three) times daily as needed (musculo-skeletal pain). 20 tablet 0  . citalopram (CELEXA) 40 MG tablet Take 1 tablet (40 mg total) by mouth daily. 90 tablet 0  . clonazePAM (KLONOPIN) 0.5 MG tablet One tablet twice a day and additional one tablet at bedtime for sleep. 270 tablet 0  . diclofenac sodium (VOLTAREN) 1 % GEL Apply 4 g topically 4 (four) times daily. To affected joint. 100 g 11  . Dulaglutide (TRULICITY) 0.75 MG/0.5ML SOPN One 0.75mg  injection weekly 4 pen 5  . levothyroxine (SYNTHROID, LEVOTHROID) 75 MCG tablet Take 1 tablet (75 mcg total) by mouth daily. 90 tablet 0  . metFORMIN (GLUCOPHAGE) 1000 MG tablet Take 1,000 mg by mouth 2 (two) times daily with a meal.    . pioglitazone (ACTOS) 45 MG tablet Take 1 tablet (45 mg total) by mouth daily. 90 tablet 0   No current facility-administered medications for this visit.    Allergies  Allergen Reactions  . Codeine Hives and Nausea And Vomiting  . Glimepiride Hives  . Keflex [Cephalexin]     hives  . Metformin And Related     diarrhea  . Penicillins   . Septra [Sulfamethoxazole-Trimethoprim] Hives  . Sulfa Antibiotics Hives and Nausea And Vomiting     Exam:  BP (!) 143/97   Pulse 93  Gen: Well NAD Right Wrist: Non-tender. Normal motion.  Right knee: Normal appearing with no effusion.  Normal ROM with crepitation.  Stable  ligamentous exam.    Study Result   CLINICAL DATA:  Pain for 4 months  EXAM: RIGHT KNEE - COMPLETE 4+ VIEW  COMPARISON:  None.  FINDINGS: Standing frontal, standing tunnel, standing lateral, and sunrise patellar images were obtained. There is no fracture or dislocation. There is a small joint effusion. There is moderate narrowing medially. There is slight narrowing of the patellofemoral joint. No erosive change.  IMPRESSION: Osteoarthritic change, primarily medially. Small joint effusion. No fracture  or dislocation.   Electronically Signed   By: Bretta BangWilliam  Woodruff III M.D.   On: 10/27/2015 13:50     Procedure: Real-time Ultrasound Guided Injection of Right Knee  Device: GE Logiq E  Images permanently stored and available for review in the ultrasound unit. Verbal informed consent obtained. Discussed risks and benefits of procedure. Warned about infection bleeding damage to structures skin hypopigmentation and fat atrophy among others. Patient expresses understanding and agreement Time-out conducted.  Noted no overlying erythema, induration, or other signs of local infection.  Skin prepped in a sterile fashion.  Local anesthesia: Topical Ethyl chloride.  With sterile technique and under real time ultrasound guidance: 80mg  kenalog and 4ml marcaine injected easily.  Completed without difficulty  Pain immediately resolved suggesting accurate placement of the medication.  Advised to call if fevers/chills, erythema, induration, drainage, or persistent bleeding.  Images permanently stored and available for review in the ultrasound unit.  Impression: Technically successful ultrasound guided injection.    No results found for this or any previous visit (from the past 24 hour(s)). No results found.    Assessment and Plan: 75 y.o. female with Right Knee pain likely due to DJD based on xray and exam and good response to injection.  Return if not better.    No orders of the defined types were placed in this encounter.   Discussed warning signs or symptoms. Please see discharge instructions. Patient expresses understanding.

## 2015-11-02 NOTE — Patient Instructions (Signed)
Thank you for coming in today. Apply voltaren gel.  Use tylenol before ibuprofen.  Return as needed.   Arthritis Arthritis is a term that is commonly used to refer to joint pain or joint disease. There are more than 100 types of arthritis. CAUSES The most common cause of this condition is wear and tear of a joint. Other causes include:  Gout.  Inflammation of a joint.  An infection of a joint.  Sprains and other injuries near the joint.  A drug reaction or allergic reaction. In some cases, the cause may not be known. SYMPTOMS The main symptom of this condition is pain in the joint with movement. Other symptoms include:  Redness, swelling, or stiffness at a joint.  Warmth coming from the joint.  Fever.  Overall feeling of illness. DIAGNOSIS This condition may be diagnosed with a physical exam and tests, including:  Blood tests.  Urine tests.  Imaging tests, such as MRI, X-rays, or a CT scan. Sometimes, fluid is removed from a joint for testing. TREATMENT Treatment for this condition may involve:  Treatment of the cause, if it is known.  Rest.  Raising (elevating) the joint.  Applying cold or hot packs to the joint.  Medicines to improve symptoms and reduce inflammation.  Injections of a steroid such as cortisone into the joint to help reduce pain and inflammation. Depending on the cause of your arthritis, you may need to make lifestyle changes to reduce stress on your joint. These changes may include exercising more and losing weight. HOME CARE INSTRUCTIONS Medicines  Take over-the-counter and prescription medicines only as told by your health care provider.  Do not take aspirin to relieve pain if gout is suspected. Activities  Rest your joint if told by your health care provider. Rest is important when your disease is active and your joint feels painful, swollen, or stiff.  Avoid activities that make the pain worse. It is important to balance activity  with rest.  Exercise your joint regularly with range-of-motion exercises as told by your health care provider. Try doing low-impact exercise, such as:  Swimming.  Water aerobics.  Biking.  Walking. Joint Care  If your joint is swollen, keep it elevated if told by your health care provider.  If your joint feels stiff in the morning, try taking a warm shower.  If directed, apply heat to the joint. If you have diabetes, do not apply heat without permission from your health care provider.  Put a towel between the joint and the hot pack or heating pad.  Leave the heat on the area for 20-30 minutes.  If directed, apply ice to the joint:  Put ice in a plastic bag.  Place a towel between your skin and the bag.  Leave the ice on for 20 minutes, 2-3 times per day.  Keep all follow-up visits as told by your health care provider. This is important. SEEK MEDICAL CARE IF:  The pain gets worse.  You have a fever. SEEK IMMEDIATE MEDICAL CARE IF:  You develop severe joint pain, swelling, or redness.  Many joints become painful and swollen.  You develop severe back pain.  You develop severe weakness in your leg.  You cannot control your bladder or bowels.   This information is not intended to replace advice given to you by your health care provider. Make sure you discuss any questions you have with your health care provider.   Document Released: 03/17/2004 Document Revised: 10/29/2014 Document Reviewed: 05/05/2014 Elsevier Interactive  Patient Education 2016 [employer].

## 2015-11-02 NOTE — Progress Notes (Signed)
HPI: Veronica [name] is a 75 y.o. female  who presents to [facility] today, 11/02/15,  for chief complaint of:  Chief Complaint  Patient presents with  . Establish Care     Diabetes: Patient has been struggling over the past year to control A1c. Was recently prescribed Trulicity but had not started this medication since she wasn't sure how to perform the injections.  Mood/depression: Patient has been on Celexa for many years. Musculoskeletal pain issues have made it difficult for her to be as active as she would like to be this is causing some problems for her. She asks about whether switching medication would be a good idea.  Cholesterol: Patient is on low-dose atorvastatin. No history of MI/CVA.   Hypothyroid: TSH ordered earlier this month but not obtained, patient advised needs to get this lab done    Past medical, surgical, social and family history reviewed: Past Medical History:  Diagnosis Date  . Anemia   . Diabetic retinopathy (HCC) 03/23/2015  . GERD (gastroesophageal reflux disease) 01/05/2015  . Hypothyroid   . Osteopenia 03/03/2014  . Type 2 diabetes mellitus with microalbuminuria or microproteinuria 03/03/2014   Actos added January 2016.     Past Surgical History:  Procedure Laterality Date  . APPENDECTOMY  02/28/11  . CESAREAN SECTION    . LAPAROSCOPIC APPENDECTOMY  03/03/2011   Procedure: APPENDECTOMY LAPAROSCOPIC;  Surgeon: Valarie MerinoMatthew B Martin, MD;  Location: [facility];  Service: General;  Laterality: N/A;  . TONSILLECTOMY     Social History  Substance Use Topics  . Smoking status: Never Smoker  . Smokeless tobacco: Never Used  . Alcohol use No   Family History  Problem Relation Age of Onset  . Cancer Mother     breast  . Cancer Father     axillary tumor  . Diabetes Mother   . Hypertension Mother   . Stroke Mother      Current medication list and allergy/intolerance information reviewed:   Current Outpatient  Prescriptions  Medication Sig Dispense Refill  . AMBULATORY NON FORMULARY MEDICATION Accucheck  Aviva lancets and test strips    . aspirin 81 MG tablet Take 1 tablet (81 mg total) by mouth daily. 30 tablet   . atorvastatin (LIPITOR) 10 MG tablet Take 1 tablet (10 mg total) by mouth daily. 90 tablet 0  . carisoprodol (SOMA) 350 MG tablet Take 1 tablet (350 mg total) by mouth 3 (three) times daily as needed (musculo-skeletal pain). 20 tablet 0  . citalopram (CELEXA) 40 MG tablet Take 1 tablet (40 mg total) by mouth daily. 90 tablet 0  . clonazePAM (KLONOPIN) 0.5 MG tablet One tablet twice a day and additional one tablet at bedtime for sleep. 270 tablet 0  . Dulaglutide (TRULICITY) 0.75 MG/0.5ML SOPN One 0.75mg  injection weekly 4 pen 5  . levothyroxine (SYNTHROID, LEVOTHROID) 75 MCG tablet Take 1 tablet (75 mcg total) by mouth daily. 90 tablet 0  . metFORMIN (GLUCOPHAGE) 1000 MG tablet Take 1,000 mg by mouth 2 (two) times daily with a meal.    . pioglitazone (ACTOS) 45 MG tablet Take 1 tablet (45 mg total) by mouth daily. 90 tablet 0   No current facility-administered medications for this visit.    Allergies  Allergen Reactions  . Codeine Hives and Nausea And Vomiting  . Glimepiride Hives  . Keflex [Cephalexin]     hives  . Metformin And Related     diarrhea  . Penicillins   . Septra [  Sulfamethoxazole-Trimethoprim] Hives  . Sulfa Antibiotics Hives and Nausea And Vomiting      Review of Systems:  Constitutional:  No  fever, no chills, No recent illness  HEENT: No  headache, no vision change  Cardiac: No  chest pain, No  pressure, No palpitations  Respiratory:  No  shortness of breath.  Gastrointestinal: No  abdominal pain, No  nausea   Musculoskeletal: No new myalgia/arthralgia  Skin: No  Rash, No other wounds/concerning lesions  Endocrine: No cold intolerance,  No heat intolerance. No polyuria/polydipsia/polyphagia   Psychiatric: No  concerns with depression, No  concerns  with anxiety, No sleep problems, No mood problems  Exam:  BP (!) 143/97   Pulse 93   Ht 5\' 7"  (1.702 m)   Wt 142 lb (64.4 kg)   BMI 22.24 kg/m   Constitutional: VS see above. General Appearance: alert, well-developed, well-nourished, NAD  Eyes: Normal lids and conjunctive, non-icteric sclera  Ears, Nose, Mouth, Throat: MMM, Normal external inspection ears/nares/mouth/lips/gums.   Neck: No masses, trachea midline. No thyroid enlargement.   Respiratory: Normal respiratory effort. no wheeze, no rhonchi, no rales  Cardiovascular: S1/S2 normal, no murmur, no rub/gallop auscultated. RRR.   Musculoskeletal: Gait normal.   Skin: warm, dry, intact. No rash/ulcer.    Psychiatric: Normal judgment/insight. Normal mood and affect. Oriented x3.    A1C 10/27/15 9.4%  ASSESSMENT/PLAN:   Type 2 diabetes mellitus with microalbuminuria or microproteinuria  Type 2 diabetes mellitus without complication, without long-term current use of insulin (HCC)  Hypothyroidism, unspecified hypothyroidism type  Anxiety and depression     Visit summary with medication list and pertinent instructions was printed for patient to review. All questions at time of visit were answered - patient instructed to contact office with any additional concerns. ER/RTC precautions were reviewed with the patient. Follow-up plan: Return in about 3 months (around 01/27/2016) for DIABETES FOLLOW-UP .  Note: Total time spent 25 minutes, greater than 50% of the visit was spent face-to-face counseling and coordinating care for the following: The primary encounter diagnosis was Type 2 diabetes mellitus with microalbuminuria or microproteinuria. Diagnoses of Type 2 diabetes mellitus without complication, without long-term current use of insulin (HCC), Hypothyroidism, unspecified hypothyroidism type, and Anxiety and depression were also pertinent to this visit.[name]

## 2015-11-02 NOTE — Patient Instructions (Addendum)
Medications for Diabetes: Metformin 1000 mg twice per day - continue this Trulicity once per week - continue this, we can consider increasing this  Actos/Pioglitazone once per day - continue this

## 2015-11-10 ENCOUNTER — Ambulatory Visit (INDEPENDENT_AMBULATORY_CARE_PROVIDER_SITE_OTHER): Payer: Medicare PPO | Admitting: Osteopathic Medicine

## 2015-11-10 ENCOUNTER — Encounter: Payer: Self-pay | Admitting: Osteopathic Medicine

## 2015-11-10 VITALS — BP 133/77 | HR 86 | Ht 67.0 in | Wt 137.0 lb

## 2015-11-10 DIAGNOSIS — Z0000 Encounter for general adult medical examination without abnormal findings: Secondary | ICD-10-CM

## 2015-11-10 DIAGNOSIS — IMO0002 Reserved for concepts with insufficient information to code with codable children: Secondary | ICD-10-CM

## 2015-11-10 DIAGNOSIS — R809 Proteinuria, unspecified: Secondary | ICD-10-CM

## 2015-11-10 DIAGNOSIS — E119 Type 2 diabetes mellitus without complications: Secondary | ICD-10-CM

## 2015-11-10 DIAGNOSIS — E1129 Type 2 diabetes mellitus with other diabetic kidney complication: Secondary | ICD-10-CM

## 2015-11-10 LAB — GLUCOSE, POCT (MANUAL RESULT ENTRY): POC Glucose: 161 mg/dl — AB (ref 70–99)

## 2015-11-10 MED ORDER — GLUCOSE BLOOD VI STRP: ORAL_STRIP | 99 refills | Status: DC

## 2015-11-10 MED ORDER — BLOOD GLUCOSE MONITOR KIT: PACK | 1 refills | Status: DC

## 2015-11-10 NOTE — Progress Notes (Signed)
HPI: Veronica [name] is a 75 y.o. female  who presents to [facility] today, 11/10/15,  for chief complaint of:  Chief Complaint  Patient presents with  . Follow-up    diabetes     Diabetes: fasting Glc over the weekend in the 200's. Not checking any other time of the day. Is worried her glucometer might be inaccurate. She does not have glucometer with her but states just checked Glc at home and was 191 around 30 minutes ago. Now (1:36 PM) 161    Past medical, surgical, social and family history reviewed: Past Medical History:  Diagnosis Date  . Anemia   . Diabetic retinopathy (HCC) 03/23/2015  . GERD (gastroesophageal reflux disease) 01/05/2015  . Hypothyroid   . Osteopenia 03/03/2014  . Type 2 diabetes mellitus with microalbuminuria or microproteinuria 03/03/2014   Actos added January 2016.     Past Surgical History:  Procedure Laterality Date  . APPENDECTOMY  02/28/11  . CESAREAN SECTION    . LAPAROSCOPIC APPENDECTOMY  03/03/2011   Procedure: APPENDECTOMY LAPAROSCOPIC;  Surgeon: Valarie MerinoMatthew B Martin, MD;  Location: [facility];  Service: General;  Laterality: N/A;  . TONSILLECTOMY     Social History  Substance Use Topics  . Smoking status: Never Smoker  . Smokeless tobacco: Never Used  . Alcohol use No   Family History  Problem Relation Age of Onset  . Cancer Mother     breast  . Diabetes Mother   . Hypertension Mother   . Stroke Mother   . Cancer Father     axillary tumor     Current medication list and allergy/intolerance information reviewed:   Current Outpatient Prescriptions  Medication Sig Dispense Refill  . AMBULATORY NON FORMULARY MEDICATION Accucheck  Aviva lancets and test strips    . aspirin 81 MG tablet Take 1 tablet (81 mg total) by mouth daily. 30 tablet   . atorvastatin (LIPITOR) 10 MG tablet Take 1 tablet (10 mg total) by mouth daily. 90 tablet 0  . carisoprodol (SOMA) 350 MG tablet Take 1 tablet (350 mg total) by mouth 3  (three) times daily as needed (musculo-skeletal pain). 20 tablet 0  . citalopram (CELEXA) 40 MG tablet Take 1 tablet (40 mg total) by mouth daily. 90 tablet 0  . clonazePAM (KLONOPIN) 0.5 MG tablet One tablet twice a day and additional one tablet at bedtime for sleep. 270 tablet 0  . diclofenac sodium (VOLTAREN) 1 % GEL Apply 4 g topically 4 (four) times daily. To affected joint. 100 g 11  . Dulaglutide (TRULICITY) 0.75 MG/0.5ML SOPN One 0.75mg  injection weekly 4 pen 5  . levothyroxine (SYNTHROID, LEVOTHROID) 75 MCG tablet Take 1 tablet (75 mcg total) by mouth daily. 90 tablet 0  . metFORMIN (GLUCOPHAGE) 1000 MG tablet Take 1,000 mg by mouth 2 (two) times daily with a meal.    . pioglitazone (ACTOS) 45 MG tablet Take 1 tablet (45 mg total) by mouth daily. 90 tablet 0   No current facility-administered medications for this visit.    Allergies  Allergen Reactions  . Codeine Hives and Nausea And Vomiting  . Glimepiride Hives  . Keflex [Cephalexin]     hives  . Metformin And Related     diarrhea  . Penicillins   . Septra [Sulfamethoxazole-Trimethoprim] Hives  . Sulfa Antibiotics Hives and Nausea And Vomiting      Review of Systems:  Constitutional:  No  fever, no chills, No recent illness  HEENT: No  headache, no vision change  Cardiac: No  chest pain, No  pressure, No palpitations  Respiratory:  No  shortness of breath.  Gastrointestinal: No  abdominal pain, No  nausea   Musculoskeletal: No new myalgia/arthralgia  Skin: No  Rash, No other wounds/concerning lesions  Endocrine: No cold intolerance,  No heat intolerance. No polyuria/polydipsia/polyphagia   Psychiatric: No  concerns with depression, No  concerns with anxiety, No sleep problems, No mood problems  Exam:  BP 133/77   Pulse 86   Ht 5\' 7"  (1.702 m)   Wt 137 lb (62.1 kg)   BMI 21.46 kg/m   Constitutional: VS see above. General Appearance: alert, well-developed, well-nourished, NAD  Eyes: Normal lids and  conjunctive, non-icteric sclera  Ears, Nose, Mouth, Throat: MMM, Normal external inspection ears/nares/mouth/lips/gums.   Neck: No masses, trachea midline. No thyroid enlargement.   Respiratory: Normal respiratory effort. no wheeze, no rhonchi, no rales  Cardiovascular: S1/S2 normal, no murmur, no rub/gallop auscultated. RRR.   Musculoskeletal: Gait normal.   Skin: warm, dry, intact. No rash/ulcer.    Psychiatric: Normal judgment/insight. Normal mood and affect. Oriented x3.   A1C 10/27/15 9.4%   ASSESSMENT/PLAN:   No diagnosis found.     Visit summary with medication list and pertinent instructions was printed for patient to review. All questions at time of visit were answered - patient instructed to contact office with any additional concerns. ER/RTC precautions were reviewed with the patient. Follow-up plan: No Follow-up on file.  Note: Total time spent 25 minutes, greater than 50% of the visit was spent face-to-face counseling and coordinating care for the following: There were no encounter diagnoses.[name]

## 2015-11-11 NOTE — Progress Notes (Signed)
HPI: Veronica [name] is a 75 y.o. female  who presents to [place] today, 11/11/15,  for chief complaint of:  Chief Complaint  Patient presents with  . Follow-up    diabetes    Blood sugars at home are running a bit high, patient thinks glucometer may be old. She requests glucose check here in the office. Medications reviewed with patient as below. She last checked her blood sugar about half an hour before her visit was 199, 161 today as noted below.  Past medical, surgical, social and family history reviewed: Past Medical History:  Diagnosis Date  . Anemia   . Diabetic retinopathy ([place]) 03/23/2015  . GERD (gastroesophageal reflux disease) 01/05/2015  . Hypothyroid   . Osteopenia 03/03/2014  . Type 2 diabetes mellitus with microalbuminuria or microproteinuria 03/03/2014   Actos added January 2016.     Past Surgical History:  Procedure Laterality Date  . APPENDECTOMY  02/28/11  . CESAREAN SECTION    . LAPAROSCOPIC APPENDECTOMY  03/03/2011   Procedure: APPENDECTOMY LAPAROSCOPIC;  Surgeon: Pedro Earls, MD;  Location: [facility];  Service: General;  Laterality: N/A;  . TONSILLECTOMY     Social History  Substance Use Topics  . Smoking status: Never Smoker  . Smokeless tobacco: Never Used  . Alcohol use No   Family History  Problem Relation Age of Onset  . Cancer Mother     breast  . Diabetes Mother   . Hypertension Mother   . Stroke Mother   . Cancer Father     axillary tumor     Current medication list and allergy/intolerance information reviewed:   Current Outpatient Prescriptions  Medication Sig Dispense Refill  . AMBULATORY NON FORMULARY MEDICATION Accucheck  Aviva lancets and test strips    . aspirin 81 MG tablet Take 1 tablet (81 mg total) by mouth daily. 30 tablet   . atorvastatin (LIPITOR) 10 MG tablet Take 1 tablet (10 mg total) by mouth daily. 90 tablet 0  . carisoprodol (SOMA) 350 MG tablet Take 1 tablet (350 mg total) by  mouth 3 (three) times daily as needed (musculo-skeletal pain). 20 tablet 0  . citalopram (CELEXA) 40 MG tablet Take 1 tablet (40 mg total) by mouth daily. 90 tablet 0  . clonazePAM (KLONOPIN) 0.5 MG tablet One tablet twice a day and additional one tablet at bedtime for sleep. 270 tablet 0  . diclofenac sodium (VOLTAREN) 1 % GEL Apply 4 g topically 4 (four) times daily. To affected joint. 100 g 11  . Dulaglutide (TRULICITY) 9.52 WU/1.3KG SOPN One 0.16m injection weekly 4 pen 5  . levothyroxine (SYNTHROID, LEVOTHROID) 75 MCG tablet Take 1 tablet (75 mcg total) by mouth daily. 90 tablet 0  . metFORMIN (GLUCOPHAGE) 1000 MG tablet Take 1,000 mg by mouth 2 (two) times daily with a meal.    . pioglitazone (ACTOS) 45 MG tablet Take 1 tablet (45 mg total) by mouth daily. 90 tablet 0  . blood glucose meter kit and supplies KIT Dispense based on patient and insurance preference. Use up to four times daily as directed. Please include lancets, test strips, control solution. 1 each 1  . glucose blood test strip Use up to 4 times per day as directed with glucometer. Disp: 100. Refill x99 100 each 99   No current facility-administered medications for this visit.    Allergies  Allergen Reactions  . Codeine Hives and Nausea And Vomiting  . Glimepiride Hives  . Keflex [Cephalexin]  hives  . Metformin And Related     diarrhea  . Penicillins   . Septra [Sulfamethoxazole-Trimethoprim] Hives  . Sulfa Antibiotics Hives and Nausea And Vomiting      Review of Systems:  Constitutional:  No  fever, no chills, No recent illness,    HEENT: No  headache, no vision change  Cardiac: No  chest pain,  Respiratory:  No  shortness of breath.  Gastrointestinal: No  abdominal pain, No  nausea  Endocrine: No polyuria/polydipsia/polyphagia   Exam:  BP 133/77   Pulse 86   Ht 5' 7" (1.702 m)   Wt 137 lb (62.1 kg)   BMI 21.46 kg/m   Constitutional: VS see above. General Appearance: alert, well-developed,  well-nourished, NAD  Neck: No masses, trachea midline.   Respiratory: Normal respiratory effort. no wheeze, no rhonchi, no rales  Cardiovascular: S1/S2 normal, no murmur, no rub/gallop auscultated. RRR.   Psychiatric: Normal judgment/insight. Normal mood and affect. Oriented x3.    Results for orders placed or performed in visit on 11/10/15 (from the past 72 hour(s))  POCT glucose (manual entry)     Status: Abnormal   Collection Time: 11/10/15  1:37 PM  Result Value Ref Range   POC Glucose 161 (A) 70 - 99 mg/dl    No results found.   ASSESSMENT/PLAN:   Type 2 diabetes mellitus with microalbuminuria or microproteinuria - Plan: blood glucose meter kit and supplies KIT, glucose blood test strip  Patient requested test - Plan: POCT glucose (manual entry)   Prescription written for new glucometer, though I would encourage patient to bring her own glucometer to the office for verification if she is worried about its inaccuracy. Make no change to medication, patient advised that I typically will manage based on A1c patterns as long as sugars are in a safe range on a daily basis. Advised her that I'm not fairly worried about blood glucose of 199 in the middle of the day. Follow up as previously directed for routine diabetes care  Visit summary with medication list and pertinent instructions was printed for patient to review. All questions at time of visit were answered - patient instructed to contact office with any additional concerns. ER/RTC precautions were reviewed with the patient. Follow-up plan: Keep currently scheduled appointment / RTC as needed

## 2015-11-20 ENCOUNTER — Telehealth: Payer: Self-pay

## 2015-11-20 DIAGNOSIS — F32A Depression, unspecified: Secondary | ICD-10-CM

## 2015-11-20 DIAGNOSIS — R809 Proteinuria, unspecified: Secondary | ICD-10-CM

## 2015-11-20 DIAGNOSIS — F329 Major depressive disorder, single episode, unspecified: Secondary | ICD-10-CM

## 2015-11-20 DIAGNOSIS — F419 Anxiety disorder, unspecified: Principal | ICD-10-CM

## 2015-11-20 DIAGNOSIS — E1129 Type 2 diabetes mellitus with other diabetic kidney complication: Secondary | ICD-10-CM

## 2015-11-20 MED ORDER — CITALOPRAM HYDROBROMIDE 40 MG PO TABS: 40.0000 mg | ORAL_TABLET | Freq: Every day | ORAL | 0 refills | Status: DC

## 2015-11-20 MED ORDER — LEVOTHYROXINE SODIUM 75 MCG PO TABS: 75.0000 ug | ORAL_TABLET | Freq: Every day | ORAL | 0 refills | Status: DC

## 2015-11-20 MED ORDER — GLUCOSE BLOOD VI STRP: ORAL_STRIP | 99 refills | Status: DC

## 2015-11-20 MED ORDER — PIOGLITAZONE HCL 45 MG PO TABS: 45.0000 mg | ORAL_TABLET | Freq: Every day | ORAL | 0 refills | Status: DC

## 2015-11-20 NOTE — Telephone Encounter (Signed)
Patient request refill for Metformin,lancets, Levothyroxine, Citalopram and Actos. [facility] Rhonda Cunningham,CMA

## 2015-11-23 ENCOUNTER — Telehealth: Payer: Self-pay

## 2015-11-23 ENCOUNTER — Other Ambulatory Visit: Payer: Self-pay | Admitting: *Deleted

## 2015-11-23 DIAGNOSIS — F32A Depression, unspecified: Secondary | ICD-10-CM

## 2015-11-23 DIAGNOSIS — F329 Major depressive disorder, single episode, unspecified: Secondary | ICD-10-CM

## 2015-11-23 DIAGNOSIS — F419 Anxiety disorder, unspecified: Principal | ICD-10-CM

## 2015-11-23 MED ORDER — CITALOPRAM HYDROBROMIDE 40 MG PO TABS: 40.0000 mg | ORAL_TABLET | Freq: Every day | ORAL | 0 refills | Status: DC

## 2015-11-23 MED ORDER — AMBULATORY NON FORMULARY MEDICATION: 11 refills | Status: DC

## 2015-11-24 NOTE — Telephone Encounter (Signed)
Patient called requested a refill for Metformin 500 mg. She stated that she takes 1 tablet 4 times a day. Patient have Metformin as a allergy (cause diarrhea) in her chart. Please advise if request will be filled or do patient need to come in for visit. Please advise that is historical entry for Metformin 100 mg Take 1 tablet 2 times a day. Averi Cacioppo,CMA

## 2015-11-25 ENCOUNTER — Telehealth: Payer: Self-pay

## 2015-11-25 MED ORDER — METFORMIN HCL 500 MG PO TABS: 500.0000 mg | ORAL_TABLET | Freq: Four times a day (QID) | ORAL | 0 refills | Status: DC

## 2015-11-25 NOTE — Telephone Encounter (Signed)
Pt reports that she have been on trulicity for about one month.  She stated that she is having SOB, dizziness, and nausea.  Please advise.

## 2015-11-25 NOTE — Telephone Encounter (Signed)
Nausea can be a side effect of this medicine, would recommend decreasing dose to whatever dose she was on prior to development of these symptoms. If symptoms persist, she will need to follow-up in the office for further evaluation as something else may be causing these problems, as shortness of breath and dizziness are not common side effects of the Trulicity.

## 2015-11-25 NOTE — Telephone Encounter (Signed)
Per note Metformin 500 mg take 1 tablet 4 times daily has been added to patient medication list and has been ordered through pharmacy. Rhonda Cunningham,CMA

## 2015-11-25 NOTE — Telephone Encounter (Signed)
Pt notified and stated that she has made an appointment for Monday

## 2015-11-25 NOTE — Telephone Encounter (Signed)
OK to refill, not sure why Metformin listed as allergy, diarrhea is common side effect, can take this off the allergy list if ok w/ patient

## 2015-11-30 ENCOUNTER — Ambulatory Visit: Payer: Self-pay | Admitting: Osteopathic Medicine

## 2015-12-01 ENCOUNTER — Other Ambulatory Visit: Payer: Self-pay

## 2015-12-09 ENCOUNTER — Other Ambulatory Visit: Payer: Self-pay

## 2015-12-09 DIAGNOSIS — F32A Depression, unspecified: Secondary | ICD-10-CM

## 2015-12-09 DIAGNOSIS — F329 Major depressive disorder, single episode, unspecified: Secondary | ICD-10-CM

## 2015-12-09 DIAGNOSIS — F419 Anxiety disorder, unspecified: Principal | ICD-10-CM

## 2015-12-09 MED ORDER — CLONAZEPAM 0.5 MG PO TABS: ORAL_TABLET | ORAL | 0 refills | Status: DC

## 2015-12-11 ENCOUNTER — Other Ambulatory Visit: Payer: Self-pay | Admitting: Osteopathic Medicine

## 2015-12-11 DIAGNOSIS — F32A Depression, unspecified: Secondary | ICD-10-CM

## 2015-12-11 DIAGNOSIS — F329 Major depressive disorder, single episode, unspecified: Secondary | ICD-10-CM

## 2015-12-11 DIAGNOSIS — F419 Anxiety disorder, unspecified: Principal | ICD-10-CM

## 2015-12-11 MED ORDER — CLONAZEPAM 0.5 MG PO TABS: 0.5000 mg | ORAL_TABLET | Freq: Three times a day (TID) | ORAL | 0 refills | Status: DC | PRN

## 2015-12-28 ENCOUNTER — Other Ambulatory Visit: Payer: Self-pay | Admitting: Osteopathic Medicine

## 2015-12-28 ENCOUNTER — Encounter: Payer: Self-pay | Admitting: Osteopathic Medicine

## 2015-12-28 MED ORDER — LEVOTHYROXINE SODIUM 88 MCG PO TABS: 88.0000 ug | ORAL_TABLET | Freq: Every day | ORAL | 0 refills | Status: AC

## 2016-01-18 ENCOUNTER — Ambulatory Visit (INDEPENDENT_AMBULATORY_CARE_PROVIDER_SITE_OTHER): Payer: Medicare PPO | Admitting: Family Medicine

## 2016-01-18 ENCOUNTER — Encounter: Payer: Self-pay | Admitting: Family Medicine

## 2016-01-18 VITALS — BP 147/80 | HR 86 | Wt 135.0 lb

## 2016-01-18 DIAGNOSIS — M5441 Lumbago with sciatica, right side: Secondary | ICD-10-CM | POA: Diagnosis not present

## 2016-01-18 DIAGNOSIS — M5431 Sciatica, right side: Secondary | ICD-10-CM | POA: Diagnosis not present

## 2016-01-18 DIAGNOSIS — M545 Low back pain, unspecified: Secondary | ICD-10-CM | POA: Insufficient documentation

## 2016-01-18 MED ORDER — METHOCARBAMOL 500 MG PO TABS: 500.0000 mg | ORAL_TABLET | Freq: Every evening | ORAL | 0 refills | Status: DC | PRN

## 2016-01-18 NOTE — Patient Instructions (Signed)
Thank you for coming in today. Come back or go to the emergency room if you notice new weakness new numbness problems walking or bowel or bladder problems. Use the muscle relaxers sparingly.  Use a heating pad.  Attend PT.   TENS UNIT: This is helpful for muscle pain and spasm.   Search and Purchase a TENS 7000 2nd edition at www.tenspros.com. It should be less than $30.     TENS unit instructions: Do not shower or bathe with the unit on Turn the unit off before removing electrodes or batteries If the electrodes lose stickiness add a drop of water to the electrodes after they are disconnected from the unit and place on plastic sheet. If you continued to have difficulty, call the TENS unit company to purchase more electrodes. Do not apply lotion on the skin area prior to use. Make sure the skin is clean and dry as this will help prolong the life of the electrodes. After use, always check skin for unusual red areas, rash or other skin difficulties. If there are any skin problems, does not apply electrodes to the same area. Never remove the electrodes from the unit by pulling the wires. Do not use the TENS unit or electrodes other than as directed. Do not change electrode placement without consultating your therapist or physician. Keep 2 fingers with between each electrode. Wear time ratio is 2:1, on to off times.    For example on for 30 minutes off for 15 minutes and then on for 30 minutes off for 15 minutes   Lumbosacral Strain Lumbosacral strain is an injury that causes pain in the lower back (lumbosacral spine). This injury usually occurs from overstretching the muscles or ligaments along your spine. A strain can affect one or more muscles or cord-like tissues that connect bones to other bones (ligaments). What are the causes? This condition may be caused by:  A hard, direct hit (blow) to the back.  Excessive stretching of the lower back muscles. This may result from:  A  fall.  Lifting something heavy.  Repetitive movements such as bending or crouching. What increases the risk? The following factors may increase your risk of getting this condition:  Participating in sports or activities that involve:  A sudden twist of the back.  Pushing or pulling motions.  Being overweight or obese.  Having poor strength and flexibility, especially tight hamstrings or weak muscles in the back or abdomen.  Having too much of a curve in the lower back.  Having a pelvis that is tilted forward. What are the signs or symptoms? The main symptom of this condition is pain in the lower back, at the site of the strain. Pain may extend (radiate) down one or both legs. How is this diagnosed? This condition is diagnosed based on:  Your symptoms.  Your medical history.  A physical exam.  Your health care provider may push on certain areas of your back to determine the source of your pain.  You may be asked to bend forward, backward, and side to side to assess the severity of your pain and your range of motion.  Imaging tests, such as:  X-rays.  MRI. How is this treated? Treatment for this condition may include:  Putting heat and cold on the affected area.  Medicines to help relieve pain and relax your muscles (muscle relaxants).  NSAIDs to help reduce swelling and discomfort. When your symptoms improve, it is important to gradually return to your normal routine as  soon as possible to reduce pain, avoid stiffness, and avoid loss of muscle strength. Generally, symptoms should improve within 6 weeks of treatment. However, recovery time varies. Follow these instructions at home: Managing pain, stiffness, and swelling  If directed, put ice on the injured area during the first 24 hours after your strain.  Put ice in a plastic bag.  Place a towel between your skin and the bag.  Leave the ice on for 20 minutes, 2-3 times a day.  If directed, put heat on the  affected area as often as told by your health care provider. Use the heat source that your health care provider recommends, such as a moist heat pack or a heating pad.  Place a towel between your skin and the heat source.  Leave the heat on for 20-30 minutes.  Remove the heat if your skin turns bright red. This is especially important if you are unable to feel pain, heat, or cold. You may have a greater risk of getting burned. Activity  Rest and return to your normal activities as told by your health care provider. Ask your health care provider what activities are safe for you.  Avoid activities that take a lot of energy for as long as told by your health care provider. General instructions  Take over-the-counter and prescription medicines only as told by your health care provider.  Donot drive or use heavy machinery while taking prescription pain medicine.  Do not use any products that contain nicotine or tobacco, such as cigarettes and e-cigarettes. If you need help quitting, ask your health care provider.  Keep all follow-up visits as told by your health care provider. This is important. How is this prevented?  Use correct form when playing sports and lifting heavy objects.  Use good posture when sitting and standing.  Maintain a healthy weight.  Sleep on a mattress with medium firmness to support your back.  Be safe and responsible while being active to avoid falls.  Do at least 150 minutes of moderate-intensity exercise each week, such as brisk walking or water aerobics. Try a form of exercise that takes stress off your back, such as swimming or stationary cycling.  Maintain physical fitness, including:  Strength.  Flexibility.  Cardiovascular fitness.  Endurance. Contact a health care provider if:  Your back pain does not improve after 6 weeks of treatment.  Your symptoms get worse. Get help right away if:  Your back pain is severe.  You cannot stand or  walk.  You have difficulty controlling when you urinate or when you have a bowel movement.  You feel nauseous or you vomit.  Your feet get very cold.  You have numbness, tingling, weakness, or problems using your arms or legs.  You develop any of the following:  Shortness of breath.  Dizziness.  Pain in your legs.  Weakness in your buttocks or legs.  Discoloration of the skin on your toes or legs. This information is not intended to replace advice given to you by your health care provider. Make sure you discuss any questions you have with your health care provider. Document Released: 11/17/2004 Document Revised: 08/28/2015 Document Reviewed: 07/12/2015 Elsevier Interactive Patient Education  2017 [employer]

## 2016-01-18 NOTE — Progress Notes (Signed)
Veronica [name] is a 75 y.o. female who presents to [facility] today for back pain.  Her lower back started hurting 5 days ago after bending over more than normal while bathing her dog. The pain feels like her sciatica pain and radiates down her right leg, but it also goes across her right lower back which is new. No numbness, weakness, tingling, bowel or bladder incontinence. She has tried tylenol and aspirin with no relief, and voltaren gel and a heating pad with temporary relief.    Past Medical History:  Diagnosis Date  . Anemia   . Diabetic retinopathy ([place]) 03/23/2015  . GERD (gastroesophageal reflux disease) 01/05/2015  . Hypothyroid   . Osteopenia 03/03/2014  . Type 2 diabetes mellitus with microalbuminuria or microproteinuria 03/03/2014   Actos added January 2016.     Past Surgical History:  Procedure Laterality Date  . APPENDECTOMY  02/28/11  . CESAREAN SECTION    . LAPAROSCOPIC APPENDECTOMY  03/03/2011   Procedure: APPENDECTOMY LAPAROSCOPIC;  Surgeon: Pedro Earls, MD;  Location: [facility];  Service: General;  Laterality: N/A;  . TONSILLECTOMY     Social History  Substance Use Topics  . Smoking status: Never Smoker  . Smokeless tobacco: Never Used  . Alcohol use No     ROS:  As above   Medications: Current Outpatient Prescriptions  Medication Sig Dispense Refill  . AMBULATORY NON FORMULARY MEDICATION Accucheck  Aviva lancets and test strips 100 each 11  . aspirin 81 MG tablet Take 1 tablet (81 mg total) by mouth daily. 30 tablet   . atorvastatin (LIPITOR) 10 MG tablet Take 1 tablet (10 mg total) by mouth daily. 90 tablet 0  . blood glucose meter kit and supplies KIT Dispense based on patient and insurance preference. Use up to four times daily as directed. Please include lancets, test strips, control solution. 1 each 1  . citalopram (CELEXA) 40 MG tablet Take 1 tablet (40 mg total) by mouth daily. 90 tablet 0  .  clonazePAM (KLONOPIN) 0.5 MG tablet Take 1 tablet (0.5 mg total) by mouth 3 (three) times daily as needed for anxiety. #90(ninety) tablets for #30(thirty) days 90 tablet 0  . diclofenac sodium (VOLTAREN) 1 % GEL Apply 4 g topically 4 (four) times daily. To affected joint. 100 g 11  . glucose blood test strip Use up to 4 times per day as directed with glucometer. Disp: 100. Refill x99 100 each 99  . insulin NPH-regular Human (NOVOLIN 70/30) (70-30) 100 UNIT/ML injection Inject into the skin. 8 UNITS AM, 6 UNITS PM. FOLLOWING W/ ENDOCRINOLOGY DR. Hartford Poli    . levothyroxine (SYNTHROID, LEVOTHROID) 88 MCG tablet Take 1 tablet (88 mcg total) by mouth daily. FOLLOWING WITH ENDOCRINOLOGY DR LEVY 1 tablet 0  . metFORMIN (GLUCOPHAGE-XR) 500 MG 24 hr tablet Take by mouth. FOLLOWING W/ ENDOCRINOLOGY DR. Hartford Poli    . carisoprodol (SOMA) 350 MG tablet Take 1 tablet (350 mg total) by mouth 3 (three) times daily as needed (musculo-skeletal pain). (Patient not taking: Reported on 01/18/2016) 20 tablet 0   No current facility-administered medications for this visit.    Allergies  Allergen Reactions  . Codeine Hives and Nausea And Vomiting  . Glimepiride Hives  . Keflex [Cephalexin]     hives  . Metformin And Related     diarrhea  . Penicillins   . Septra [Sulfamethoxazole-Trimethoprim] Hives  . Sulfa Antibiotics Hives and Nausea And Vomiting     Exam:  BP (!) 147/80   Pulse 86   Wt 135 lb (61.2 kg)   BMI 21.14 kg/m  General: Well Developed, well nourished, and in no acute distress.  Neuro/Psych: Alert and oriented x3, extra-ocular muscles intact, able to move all 4 extremities, sensation grossly intact. Skin: Warm and dry, no rashes noted.  Respiratory: Not using accessory muscles, speaking in full sentences, trachea midline.  Cardiovascular: Pulses palpable, no extremity edema. Abdomen: Does not appear distended. MSK:  Lumbar spine: Normal appearance Tenderness to palpation over right paraspinal  area Tenderness on flexion and rotation Full strength of lower extremities Straight leg raise test positive on right side  No results found for this or any previous visit (from the past 48 hour(s)). No results found.    Assessment and Plan: 75 y.o. female with acute onset back pain most likely due to muscle strain. Recommended PT, TENS unit, heating pad, muscle relaxants sparingly for sleep. Follow up PRN. Avoid steroids due to diabetes.  No orders of the defined types were placed in this encounter.   Discussed warning signs or symptoms. Please see discharge instructions. Patient expresses understanding.

## 2016-01-19 ENCOUNTER — Ambulatory Visit: Payer: Self-pay | Admitting: Family Medicine

## 2016-01-20 ENCOUNTER — Encounter: Payer: Self-pay | Admitting: Family Medicine

## 2016-01-20 ENCOUNTER — Ambulatory Visit (INDEPENDENT_AMBULATORY_CARE_PROVIDER_SITE_OTHER): Payer: Medicare PPO | Admitting: Family Medicine

## 2016-01-20 VITALS — BP 149/89 | HR 98

## 2016-01-20 DIAGNOSIS — M654 Radial styloid tenosynovitis [de Quervain]: Secondary | ICD-10-CM | POA: Diagnosis not present

## 2016-01-20 MED ORDER — OXYCODONE-ACETAMINOPHEN 5-325 MG PO TABS: 1.0000 | ORAL_TABLET | Freq: Three times a day (TID) | ORAL | 0 refills | Status: DC | PRN

## 2016-01-20 NOTE — Patient Instructions (Signed)
Thank you for coming in today. Use oxycodone sparingly for pain. Avoid high doses of klonopin with the oxycodone.  You should start feeling better for both the wrist and the back soon.

## 2016-01-20 NOTE — Progress Notes (Signed)
Veronica [name] is a 75 y.o. female who presents to [facility] today for right wrist pain and follow-up back pain.  Right wrist pain: Patient notes continued right radial wrist pain. She had ultrasound-guided aspiration and injection of ganglion cyst on the right dorsal wrist near the radial styloid in early September. She did well until recently when she notes worsening pain and swelling especially near the radial styloid. She denies any recent injury. She notes she's been increasing her activity level recently in preparation for putting a water feature in her garden. She has pain present with activity and somewhat better with rest. She's tried relative rest as well as wrist bracing which have not helped. Additionally she's tried some over-the-counter medications.  Additionally patient was recently seen for her back pain radiating to the leg. She notes symptoms are still present and are quite severe. She starts physical therapy tomorrow. She requests pain medications if possible. She denies any weakness or numbness.   Past Medical History:  Diagnosis Date  . Anemia   . Diabetic retinopathy ([place]) 03/23/2015  . GERD (gastroesophageal reflux disease) 01/05/2015  . Hypothyroid   . Osteopenia 03/03/2014  . Type 2 diabetes mellitus with microalbuminuria or microproteinuria 03/03/2014   Actos added January 2016.     Past Surgical History:  Procedure Laterality Date  . APPENDECTOMY  02/28/11  . CESAREAN SECTION    . LAPAROSCOPIC APPENDECTOMY  03/03/2011   Procedure: APPENDECTOMY LAPAROSCOPIC;  Surgeon: Pedro Earls, MD;  Location: [facility];  Service: General;  Laterality: N/A;  . TONSILLECTOMY     Social History  Substance Use Topics  . Smoking status: Never Smoker  . Smokeless tobacco: Never Used  . Alcohol use No     ROS:  As above   Medications: Current Outpatient Prescriptions  Medication Sig Dispense Refill  . AMBULATORY NON  FORMULARY MEDICATION Accucheck  Aviva lancets and test strips 100 each 11  . aspirin 81 MG tablet Take 1 tablet (81 mg total) by mouth daily. 30 tablet   . atorvastatin (LIPITOR) 10 MG tablet Take 1 tablet (10 mg total) by mouth daily. 90 tablet 0  . blood glucose meter kit and supplies KIT Dispense based on patient and insurance preference. Use up to four times daily as directed. Please include lancets, test strips, control solution. 1 each 1  . carisoprodol (SOMA) 350 MG tablet Take 1 tablet (350 mg total) by mouth 3 (three) times daily as needed (musculo-skeletal pain). 20 tablet 0  . citalopram (CELEXA) 40 MG tablet Take 1 tablet (40 mg total) by mouth daily. 90 tablet 0  . clonazePAM (KLONOPIN) 0.5 MG tablet Take 1 tablet (0.5 mg total) by mouth 3 (three) times daily as needed for anxiety. #90(ninety) tablets for #30(thirty) days 90 tablet 0  . diclofenac sodium (VOLTAREN) 1 % GEL Apply 4 g topically 4 (four) times daily. To affected joint. 100 g 11  . glucose blood test strip Use up to 4 times per day as directed with glucometer. Disp: 100. Refill x99 100 each 99  . insulin NPH-regular Human (NOVOLIN 70/30) (70-30) 100 UNIT/ML injection Inject into the skin. 8 UNITS AM, 6 UNITS PM. FOLLOWING W/ ENDOCRINOLOGY DR. Hartford Poli    . levothyroxine (SYNTHROID, LEVOTHROID) 88 MCG tablet Take 1 tablet (88 mcg total) by mouth daily. FOLLOWING WITH ENDOCRINOLOGY DR LEVY 1 tablet 0  . metFORMIN (GLUCOPHAGE-XR) 500 MG 24 hr tablet Take by mouth. FOLLOWING W/ ENDOCRINOLOGY DR. Hartford Poli    .  methocarbamol (ROBAXIN) 500 MG tablet Take 1 tablet (500 mg total) by mouth at bedtime as needed for muscle spasms. 30 tablet 0  . oxyCODONE-acetaminophen (ROXICET) 5-325 MG tablet Take 1 tablet by mouth every 8 (eight) hours as needed for severe pain. 20 tablet 0   No current facility-administered medications for this visit.    Allergies  Allergen Reactions  . Codeine Hives and Nausea And Vomiting  . Glimepiride Hives  .  Keflex [Cephalexin]     hives  . Metformin And Related     diarrhea  . Penicillins   . Septra [Sulfamethoxazole-Trimethoprim] Hives  . Sulfa Antibiotics Hives and Nausea And Vomiting     Exam:  BP (!) 149/89   Pulse 98  General: Well Developed, well nourished, and in no acute distress.  Neuro/Psych: Alert and oriented x3, extra-ocular muscles intact, able to move all 4 extremities, sensation grossly intact. Skin: Warm and dry, no rashes noted.  Respiratory: Not using accessory muscles, speaking in full sentences, trachea midline.  Cardiovascular: Pulses palpable, no extremity edema. Abdomen: Does not appear distended. MSK: Right wrist: Slightly swollen overlying the radial styloid. Tender to palpation at the radial styloid. Pain with some extension. Positive Finkelstein's test. Pulses capillary refill sensation and grip strength intact. L spine: Nontender normal motion normal gait.  Limited musculoskeletal ultrasound of the right dorsal wrist first compartment shows significantly and enlarged extensor tendon sheath with a hypoechoic fluid. The tendon itself appears to be somewhat fragmented but intact with hypoechoic fluid infiltration within the tendon. No obvious tears present.    Procedure: Real-time Ultrasound Guided Injection of  tendon sheath injection for de Quervain's tenosynovitis.  Device: GE Logiq E  Images permanently stored and available for review in the ultrasound unit. Verbal informed consent obtained. Discussed risks and benefits of procedure. Warned about infection bleeding damage to structures skin hypopigmentation and fat atrophy among others. Patient expresses understanding and agreement Time-out conducted.  Noted no overlying erythema, induration, or other signs of local infection.  Skin prepped in a sterile fashion.  Local anesthesia: Topical Ethyl chloride.  With sterile technique and under real time ultrasound guidance: 1.5 mL of Marcaine and 5 mg  of dexamethasone injected easily.  Completed without difficulty  Pain immediately resolved suggesting accurate placement of the medication.  Advised to call if fevers/chills, erythema, induration, drainage, or persistent bleeding.  Images permanently stored and available for review in the ultrasound unit.  Impression: Technically successful ultrasound guided injection.      No results found for this or any previous visit (from the past 48 hour(s)). No results found.    Assessment and Plan: 75 y.o. female with de Quervain's tenosynovitis. Plan for relative rest and injection as above. Return for recheck in the near future.  As for back pain patient is doing reasonably well. Attend physical therapy and prescribed a small amount of oxycodone for pain control.    No orders of the defined types were placed in this encounter.   Discussed warning signs or symptoms. Please see discharge instructions. Patient expresses understanding.

## 2016-01-21 ENCOUNTER — Ambulatory Visit (INDEPENDENT_AMBULATORY_CARE_PROVIDER_SITE_OTHER): Payer: Medicare PPO | Admitting: Physical Therapy

## 2016-01-21 ENCOUNTER — Encounter: Payer: Self-pay | Admitting: Physical Therapy

## 2016-01-21 DIAGNOSIS — M545 Low back pain: Secondary | ICD-10-CM | POA: Diagnosis not present

## 2016-01-21 DIAGNOSIS — G8929 Other chronic pain: Secondary | ICD-10-CM

## 2016-01-21 DIAGNOSIS — M2560 Stiffness of unspecified joint, not elsewhere classified: Secondary | ICD-10-CM

## 2016-01-21 DIAGNOSIS — M6281 Muscle weakness (generalized): Secondary | ICD-10-CM | POA: Diagnosis not present

## 2016-01-21 DIAGNOSIS — M2569 Stiffness of other specified joint, not elsewhere classified: Secondary | ICD-10-CM

## 2016-01-21 NOTE — Therapy (Signed)
[facility] [address] [place], [place], [address] Phone: [phone]   Fax:  [phone]  Physical Therapy Evaluation  Patient Details  Name: Veronica Gauzehyllis Dung MRN: [id number] Date of Birth: 05/12/1940 Referring Provider: Dr Teressa LowerE Corey  Encounter Date: 01/21/2016      PT End of Session - 01/21/16 0927    Visit Number 1   Number of Visits 8   Date for PT Re-Evaluation 02/18/16   PT Start Time 0927   PT Stop Time 1052   PT Time Calculation (min) 85 min   Activity Tolerance Patient tolerated treatment well      Past Medical History:  Diagnosis Date  . Anemia   . Diabetic retinopathy (HCC) 03/23/2015  . GERD (gastroesophageal reflux disease) 01/05/2015  . Hypothyroid   . Osteopenia 03/03/2014  . Type 2 diabetes mellitus with microalbuminuria or microproteinuria 03/03/2014   Actos added January 2016.      Past Surgical History:  Procedure Laterality Date  . APPENDECTOMY  02/28/11  . CESAREAN SECTION    . LAPAROSCOPIC APPENDECTOMY  03/03/2011   Procedure: APPENDECTOMY LAPAROSCOPIC;  Surgeon: Valarie MerinoMatthew B Martin, MD;  Location: [facility];  Service: General;  Laterality: N/A;  . TONSILLECTOMY      There were no vitals filed for this visit.       Subjective Assessment - 01/21/16 0927    Subjective Pt just had an injection in her wrist yesterday due to having a cyst and now her DM numbers are off. In regards to her back she developed LBP in June while traveling and on the road.  She initally had so much pain she couldn't jog, lift wieghts or do anything.  She saw MD's while on the road and they didn't really help her.     How long can you sit comfortably? very limited   How long can you stand comfortably? not limited   How long can you walk comfortably? uses treadmill, walks her dog for a mile   Diagnostic tests nothing recently   Patient Stated Goals exercise and work out again without pain. Swim, [employer]   Currently in Pain?  Yes   Pain Score 9    Pain Location Back   Pain Orientation Right   Pain Descriptors / Indicators Burning;Shooting;Sharp;Throbbing   Pain Type Acute pain   Pain Radiating Towards down to the calf Rt side   Pain Frequency Constant   Aggravating Factors  lying down in bed even with pillow support.    Pain Relieving Factors medicine            [facility] PT Assessment - 01/21/16 0001      Assessment   Medical Diagnosis LBP, Rt sided sciatica   Referring Provider Dr Teressa LowerE Corey   Onset Date/Surgical Date 07/21/15   Hand Dominance Right   Next MD Visit 02/10/16   Prior Therapy none     Precautions   Precautions None     Balance Screen   Has the patient fallen in the past 6 months No     Home Environment   Living Environment Private residence   Home Layout Two level  no difficulty     Prior Function   Level of Independence Independent   Vocation Retired   [employer]er[employer] Requirements home for 3 months will be on the road again   Leisure exercise, walk dog, gamer - video,piano     Observation/Other Assessments   Focus on Therapeutic Outcomes (FOTO)  60% limited  Functional Tests   Functional tests Squat;Single leg stance     Squat   Comments even weight, decreased control Rt LE     Single Leg Stance   Comments Lt WNL, Rt 5 sec with pain     Posture/Postural Control   Posture/Postural Control Postural limitations   Postural Limitations Decreased lumbar lordosis;Rounded Shoulders  BWD lean trunk     ROM / Strength   AROM / PROM / Strength AROM;Strength     AROM   Overall AROM Comments Rt knee ext -3   AROM Assessment Site Lumbar   Lumbar Flexion mid shin - pain   Lumbar Extension WNL   Lumbar - Right Side Bend WNL with pain   Lumbar - Left Side Bend WNL   Lumbar - Right Rotation WNL with pain   Lumbar - Left Rotation WNL with pain     Strength   Strength Assessment Site Knee;Hip;Ankle;Lumbar   Right/Left Hip Left;Right   Right Hip Flexion 4+/5   Right Hip  Extension 3+/5  with pain   Right Hip ABduction --  5-/5   Left Hip Flexion 5/5   Left Hip Extension 5/5   Left Hip ABduction 5/5   Right/Left Knee --  Lt WNL, Rt grossly 4+/5   Right/Left Ankle --  bilat WNL   Lumbar Flexion --  TA good   Lumbar Extension --  multifidi fair     Flexibility   Soft Tissue Assessment /Muscle Length yes   Hamstrings WNL   Quadriceps Rt tighter than Lt    Piriformis tight and pain Rt side     Palpation   Spinal mobility hypomobile in her spine lumbar and thoracic   Palpation comment tight and tender in the Rt QL, lumbar paraspinals, gluts and piriformis     Special Tests    Special Tests --  (-) limbar and SIJ tests                   [facility] Adult PT Treatment/Exercise - 01/21/16 0001      Exercises   Exercises Lumbar     Lumbar Exercises: Stretches   Quad Stretch 1 rep;30 seconds  prone with strap   ITB Stretch 30 seconds  cross body stretch wtih strap   Piriformis Stretch 30 seconds  pulling knee to opposite shoulder     Lumbar Exercises: Supine   Other Supine Lumbar Exercises quad sets and upper body work with band - hand out issued.      Modalities   Modalities Electrical Stimulation;Moist Heat     Moist Heat Therapy   Number Minutes Moist Heat 20 Minutes   Moist Heat Location Lumbar Spine  buttocks     Electrical Stimulation   Electrical Stimulation Location Rt lumbar and buttocks   Electrical Stimulation Action IFC   Electrical Stimulation Parameters to tolerance   Electrical Stimulation Goals Pain;Tone     Manual Therapy   Manual Therapy Soft tissue mobilization   Soft tissue mobilization STW to Rt buttocks and low back, decreased tenderness and guarding after          Trigger Point Dry Needling - 01/21/16 1033    Consent Given? Yes   Education Handout Provided Yes   Muscles Treated Upper Body Quadratus Lumborum   Muscles Treated Lower Body Gluteus maximus;Piriformis   Gluteus Maximus Response  Palpable increased muscle length;Twitch response elicited  Rt   Piriformis Response Palpable increased muscle length;Twitch response elicited  Rt  PT Education - 01/21/16 1033    Education provided Yes   Education Details HEP, TDN   Person(s) Educated Patient   Methods Explanation;Demonstration;Handout   Comprehension Returned demonstration;Verbalized understanding             PT Long Term Goals - 01/21/16 1043      PT LONG TERM GOAL #1   Title I with advanced HEP to include resuming weight training and light jogging ( 02/18/16)    Time 4   Period Weeks   Status New     PT LONG TERM GOAL #2   Title report =/> 75% reduction of pain when she is in bed ( 02/18/16)    Time 4   Period Weeks   Status New     PT LONG TERM GOAL #3   Title demo Rt hip/knee strength =/> Lt ( 02/18/16)    Time 4   Period Weeks   Status New     PT LONG TERM GOAL #4   Title report no tenderness with palpation in the rt low back and buttocks ( 02/18/16)    Time 4   Period Weeks   Status New     PT LONG TERM GOAL #5   Title improve FOTO =/< 48% limited, CK level ( 02/18/16)    Time 4   Period Weeks   Status New               Plan - 01/21/16 1040    Clinical Impression Statement 75 yo female, "Veronica [name]",  presents with 6 month h/o Rt low back and sciatica pain.  She has been on the road and unable to receive consistent care. She is home for the next 3 months and would like to have this resolved so she can resume her exercise and running.  She has a lot of tightness and guarding in her Rt side low back and buttocks.  She also has some Rt LE weakness. Veronica [name] has postural changes and questionable osteoporosis.      Rehab Potential Good   PT Frequency 2x / week   PT Duration 4 weeks   PT Treatment/Interventions Moist Heat;Ultrasound;Therapeutic exercise;Dry needling;Taping;Manual techniques;Neuromuscular re-education;Cryotherapy;Electrical Stimulation;Functional mobility  training;Patient/family education   PT Next Visit Plan assess response to TDN, core stability and Rt LE strengthening. Manual work to [employer] buttocks/low back PRN   Consulted and Agree with Plan of Care Patient      Patient will benefit from skilled therapeutic intervention in order to improve the following deficits and impairments:  Postural dysfunction, Decreased strength, Pain, Hypomobility, Increased muscle spasms, Decreased range of motion  Visit Diagnosis: Chronic right-sided low back pain, with sciatica presence unspecified - Plan: PT plan of care cert/re-cert  Muscle weakness (generalized) - Plan: PT plan of care cert/re-cert  Back stiffness - Plan: PT plan of care cert/re-cert     Problem List Patient Active Problem List   Diagnosis Date Noted  . De Quervain's tenosynovitis, right 01/20/2016  . Lumbago 01/18/2016  . Hypercalcemia 12/28/2015  . Ganglion cyst of wrist 10/27/2015  . Sciatica of right side 10/27/2015  . Right knee pain 10/27/2015  . Diabetic retinopathy (HCC) 03/23/2015  . GERD (gastroesophageal reflux disease) 01/05/2015  . Osteopenia 03/03/2014  . Type 2 diabetes mellitus with microalbuminuria or microproteinuria 03/03/2014  . Hypothyroid 02/28/2014  . Anxiety and depression 02/28/2014    Roderic Scarce PT 01/21/2016, 10:48 AM  [facility] [address] [place], [place], [address] Phone:  [phone]   Fax:  [phone]  Name: Veronica [name] MRN: [id number] Date of Birth: 12/01/1940

## 2016-01-21 NOTE — Patient Instructions (Addendum)
Strengthening: Quadriceps Set    Tighten muscles on top of thighs by pushing knees down into surface. Hold __5-10__ seconds. Repeat _10___ times per set. Do _1___ sets per session. Do __1__ sessions per day.  Stretching: Piriformis (Supine)    Pull right knee toward opposite shoulder. Hold _30-45___ seconds. Relax. Repeat __1__ times per set. Do __1__ sets per session. Do __1__ sessions per day.  Quads / HF, Prone    Lie face down, knees together. Grasp right ankle with same-side hand. Use towel if needed to reach. Gently pull foot toward buttock. Hold __30-45_ seconds. Repeat _1__ times per session. Do _1__ sessions per day.    Outer Hip Stretch: Reclined IT Band Stretch (Strap)    Strap around opposite foot, pull across only as far as possible with shoulders on mat. Hold for __30-45__ sec. Repeat _1___ times each leg.  Copyright  VHI. All rights reserved.   Over Head Pull: Narrow Grip     [place] [phone]   On back, knees bent, feet flat, band across thighs, elbows straight but relaxed. Pull hands apart (start). Keeping elbows straight, bring arms up and over head, hands toward floor. Keep pull steady on band. Hold momentarily. Return slowly, keeping pull steady, back to start. Repeat ___ times. Band color ______   Side Pull: Double Arm   On back, knees bent, feet flat. Arms perpendicular to body, shoulder level, elbows straight but relaxed. Pull arms out to sides, elbows straight. Resistance band comes across collarbones, hands toward floor. Hold momentarily. Slowly return to starting position. Repeat ___ times. Band color _____   Sash   On back, knees bent, feet flat, left hand on left hip, right hand above left. Pull right arm DIAGONALLY (hip to shoulder) across chest. Bring right arm along head toward floor. Hold momentarily. Slowly return to starting position. Repeat ___ times. Do with left arm. Band color ______   Shoulder Rotation: Double Arm   On back,  knees bent, feet flat, elbows tucked at sides, bent 90, hands palms up. Pull hands apart and down toward floor, keeping elbows near sides. Hold momentarily. Slowly return to starting position. Repeat ___ times. Band color ______   Trigger Point Dry Needling  . What is Trigger Point Dry Needling (DN)? o DN is a physical therapy technique used to treat muscle pain and dysfunction. Specifically, DN helps deactivate muscle trigger points (muscle knots).  o A thin filiform needle is used to penetrate the skin and stimulate the underlying trigger point. The goal is for a local twitch response (LTR) to occur and for the trigger point to relax. No medication of any kind is injected during the procedure.   . What Does Trigger Point Dry Needling Feel Like?  o The procedure feels different for each individual patient. Some patients report that they do not actually feel the needle enter the skin and overall the process is not painful. Very mild bleeding may occur. However, many patients feel a deep cramping in the muscle in which the needle was inserted. This is the local twitch response.   [name]. How Will I feel after the treatment? o Soreness is normal, and the onset of soreness may not occur for a few hours. Typically this soreness does not last longer than two days.  o Bruising is uncommon, however; ice can be used to decrease any possible bruising.  o In rare cases feeling tired or nauseous after the treatment is normal. In addition, your symptoms may get worse before  they get better, this period will typically not last longer than 24 hours.   . What Can I do After My Treatment? o Increase your hydration by drinking more water for the next 24 hours. o You may place ice or heat on the areas treated that have become sore, however, do not use heat on inflamed or bruised areas. Heat often brings more relief post needling. o You can continue your regular activities, but vigorous activity is not recommended  initially after the treatment for 24 hours. o DN is best combined with other physical therapy such as strengthening, stretching, and other therapies.

## 2016-01-25 ENCOUNTER — Ambulatory Visit (INDEPENDENT_AMBULATORY_CARE_PROVIDER_SITE_OTHER): Payer: Medicare PPO | Admitting: Rehabilitative and Restorative Service Providers"

## 2016-01-25 ENCOUNTER — Ambulatory Visit: Payer: Self-pay | Admitting: Family Medicine

## 2016-01-25 ENCOUNTER — Encounter: Payer: Self-pay | Admitting: Rehabilitative and Restorative Service Providers"

## 2016-01-25 DIAGNOSIS — M2560 Stiffness of unspecified joint, not elsewhere classified: Secondary | ICD-10-CM | POA: Diagnosis not present

## 2016-01-25 DIAGNOSIS — M6281 Muscle weakness (generalized): Secondary | ICD-10-CM

## 2016-01-25 DIAGNOSIS — M2569 Stiffness of other specified joint, not elsewhere classified: Secondary | ICD-10-CM

## 2016-01-25 DIAGNOSIS — M545 Low back pain: Secondary | ICD-10-CM

## 2016-01-25 DIAGNOSIS — G8929 Other chronic pain: Secondary | ICD-10-CM

## 2016-01-25 NOTE — Therapy (Signed)
[facility] [address] [place], [place], [address] Phone: [phone]   Fax:  [phone]  Physical Therapy Treatment  Patient Details  Name: Veronica [name] MRN: [id number] Date of Birth: 02/17/1941 Referring Provider: Dr Teressa LowerE Corey  Encounter Date: 01/25/2016      PT End of Session - 01/25/16 1114    Visit Number 2   Number of Visits 8   Date for PT Re-Evaluation 02/18/16   PT Start Time 1105   PT Stop Time 1202   PT Time Calculation (min) 57 min   Activity Tolerance Patient tolerated treatment well      Past Medical History:  Diagnosis Date  . Anemia   . Diabetic retinopathy (HCC) 03/23/2015  . GERD (gastroesophageal reflux disease) 01/05/2015  . Hypothyroid   . Osteopenia 03/03/2014  . Type 2 diabetes mellitus with microalbuminuria or microproteinuria 03/03/2014   Actos added January 2016.      Past Surgical History:  Procedure Laterality Date  . APPENDECTOMY  02/28/11  . CESAREAN SECTION    . LAPAROSCOPIC APPENDECTOMY  03/03/2011   Procedure: APPENDECTOMY LAPAROSCOPIC;  Surgeon: Valarie MerinoMatthew B Martin, MD;  Location: [facility];  Service: General;  Laterality: N/A;  . TONSILLECTOMY      There were no vitals filed for this visit.      Subjective Assessment - 01/25/16 1111    Subjective Patient reports that she had a good bit of soreness following TDN but feels that the needling was helpful    Currently in Pain? Yes   Pain Score 8    Pain Location Back   Pain Orientation Right   Pain Descriptors / Indicators Burning;Shooting;Sharp;Throbbing   Pain Type Acute pain;Chronic pain   Pain Onset More than a month ago   Pain Frequency Constant                         [facility] Adult PT Treatment/Exercise - 01/25/16 0001      Self-Care   Self-Care --  instruction in use and care of TENS unit for home      Lumbar Exercises: Stretches   Passive Hamstring Stretch 30 seconds;2 reps   Quad Stretch 1  rep;30 seconds  prone with strap   ITB Stretch 30 seconds  cross body stretch wtih strap   Piriformis Stretch 30 seconds  pulling knee to opposite shoulder     Lumbar Exercises: Aerobic   Stationary Bike nustep L4 x 5 min      Lumbar Exercises: Standing   Wall Slides 10 reps;3 seconds     Lumbar Exercises: Supine   Bridge 10 reps;5 seconds   Other Supine Lumbar Exercises quad set 10 sec x 10    Other Supine Lumbar Exercises supine TB shd abd; ER; SASH red TB x 10 ea      Modalities   Modalities Electrical Stimulation;Moist Heat     Moist Heat Therapy   Number Minutes Moist Heat 20 Minutes   Moist Heat Location Lumbar Spine  buttocks     Electrical Stimulation   Electrical Stimulation Location Rt lumbar and buttocks   Electrical Stimulation Action IFC   Electrical Stimulation Parameters to tolerance   Electrical Stimulation Goals Pain;Tone     Manual Therapy   Manual Therapy Soft tissue mobilization   Soft tissue mobilization STW to Rt buttocks and low back, decreased tenderness and guarding after          Trigger Point Dry Needling -  01/25/16 1133    Consent Given? Yes   Muscles Treated Upper Body Quadratus Lumborum;Longissimus   Muscles Treated Lower Body Gluteus maximus;Piriformis   Longissimus Response Palpable increased muscle length   Gluteus Maximus Response Palpable increased muscle length   Piriformis Response Palpable increased muscle length              PT Education - 01/25/16 1129    Education provided Yes   Education Details HEP   Person(s) Educated Patient   Methods Explanation;Demonstration;Tactile cues;Verbal cues;Handout   Comprehension Verbalized understanding;Returned demonstration;Verbal cues required;Tactile cues required             PT Long Term Goals - 01/25/16 1114      PT LONG TERM GOAL #1   Title I with advanced HEP to include resuming weight training and light jogging ( 02/18/16)    Time 4   Period Weeks   Status  On-going     PT LONG TERM GOAL #2   Title report =/> 75% reduction of pain when she is in bed ( 02/18/16)    Time 4   Period Weeks   Status On-going     PT LONG TERM GOAL #3   Title demo Rt hip/knee strength =/> Lt ( 02/18/16)    Time 4   Period Weeks   Status On-going     PT LONG TERM GOAL #4   Title report no tenderness with palpation in the rt low back and buttocks ( 02/18/16)    Time 4   Period Weeks   Status On-going     PT LONG TERM GOAL #5   Title improve FOTO =/< 48% limited, CK level ( 02/18/16)    Time 4   Period Weeks   Status On-going               Plan - 01/25/16 1115    Clinical Impression Statement Patient reports some positive response to the TDN. She continues to have pain and discomfort. No goals accomplished. Only second visit.    Rehab Potential Good   PT Frequency 2x / week   PT Duration 4 weeks   PT Next Visit Plan continue with TDN, core stability and Rt LE strengthening. Manual work to [employer] buttocks/low back PRN   Consulted and Agree with Plan of Care Patient      Patient will benefit from skilled therapeutic intervention in order to improve the following deficits and impairments:  Postural dysfunction, Decreased strength, Pain, Hypomobility, Increased muscle spasms, Decreased range of motion  Visit Diagnosis: Chronic right-sided low back pain, with sciatica presence unspecified  Muscle weakness (generalized)  Back stiffness     Problem List Patient Active Problem List   Diagnosis Date Noted  . De Quervain's tenosynovitis, right 01/20/2016  . Lumbago 01/18/2016  . Hypercalcemia 12/28/2015  . Ganglion cyst of wrist 10/27/2015  . Sciatica of right side 10/27/2015  . Right knee pain 10/27/2015  . Diabetic retinopathy (HCC) 03/23/2015  . GERD (gastroesophageal reflux disease) 01/05/2015  . Osteopenia 03/03/2014  . Type 2 diabetes mellitus with microalbuminuria or microproteinuria 03/03/2014  . Hypothyroid 02/28/2014  . Anxiety  and depression 02/28/2014    Celyn Rober MinionP Holt PT, MPH  01/25/2016, 1:38 PM  [facility] [address] [place], [place], [address] Phone: [phone]   Fax:  [phone]  Name: Veronica [name] MRN: [id number] Date of Birth: 03/27/1940

## 2016-01-25 NOTE — Patient Instructions (Signed)
Bridging    Slowly raise buttocks from floor, keeping core tight. Hold 5-10 sec Repeat _10___ times per set. Do _1-2___ sets per session. Do __1__ sessions per day.   Back Wall Slide    With feet __10-12__ inches from wall, lean as much of back against the wall as possible. Gently squat down _10-12__ inches, keeping back against wall.  Hold __10__ seconds while counting out loud. Repeat _10___ times. Do _1-2___ sessions per day.

## 2016-01-29 ENCOUNTER — Encounter: Payer: Self-pay | Admitting: Rehabilitative and Restorative Service Providers"

## 2016-02-01 ENCOUNTER — Encounter: Payer: Self-pay | Admitting: Osteopathic Medicine

## 2016-02-01 ENCOUNTER — Ambulatory Visit (INDEPENDENT_AMBULATORY_CARE_PROVIDER_SITE_OTHER): Payer: Medicare PPO | Admitting: Osteopathic Medicine

## 2016-02-01 VITALS — BP 148/79 | HR 107 | Wt 136.0 lb

## 2016-02-01 DIAGNOSIS — R809 Proteinuria, unspecified: Secondary | ICD-10-CM | POA: Diagnosis not present

## 2016-02-01 DIAGNOSIS — F418 Other specified anxiety disorders: Secondary | ICD-10-CM

## 2016-02-01 DIAGNOSIS — F329 Major depressive disorder, single episode, unspecified: Secondary | ICD-10-CM

## 2016-02-01 DIAGNOSIS — Z8739 Personal history of other diseases of the musculoskeletal system and connective tissue: Secondary | ICD-10-CM | POA: Diagnosis not present

## 2016-02-01 DIAGNOSIS — E1129 Type 2 diabetes mellitus with other diabetic kidney complication: Secondary | ICD-10-CM

## 2016-02-01 DIAGNOSIS — F32A Depression, unspecified: Secondary | ICD-10-CM

## 2016-02-01 DIAGNOSIS — F419 Anxiety disorder, unspecified: Secondary | ICD-10-CM

## 2016-02-01 LAB — POCT GLYCOSYLATED HEMOGLOBIN (HGB A1C): HEMOGLOBIN A1C: 6.6

## 2016-02-01 MED ORDER — CITALOPRAM HYDROBROMIDE 40 MG PO TABS: 40.0000 mg | ORAL_TABLET | Freq: Every day | ORAL | 3 refills | Status: DC

## 2016-02-01 MED ORDER — CLONAZEPAM 1 MG PO TABS: 0.5000 mg | ORAL_TABLET | Freq: Two times a day (BID) | ORAL | 0 refills | Status: DC | PRN

## 2016-02-01 NOTE — Progress Notes (Signed)
HPI: Veronica [name] is a 75 y.o. female  who presents to [place] today, 02/01/16,  for chief complaint of:  Chief Complaint  Patient presents with  . Follow-up    DIABETES    Diabetes: Patient is following with endocrinology. Has been started on insulin. Doing well, no hypoglycemia. A1c much better.   Concern for osteoporosis: Patient states that she had a bone density test maybe 3 years ago which showed some thinning of the bones. Had some questions about whether this was concerning to her wrist pain.  Anxiety/depression: Patient requests three-month supply of clonazepam. She and her husband will be traveling for about 3 months at a time upcoming in the spring. Has been on this medication for many years. Requests refill of Celexa as well.     Past medical, surgical, social and family history reviewed: Patient Active Problem List   Diagnosis Date Noted  . De Quervain's tenosynovitis, right 01/20/2016  . Lumbago 01/18/2016  . Hypercalcemia 12/28/2015  . Ganglion cyst of wrist 10/27/2015  . Sciatica of right side 10/27/2015  . Right knee pain 10/27/2015  . Diabetic retinopathy ([place]) 03/23/2015  . GERD (gastroesophageal reflux disease) 01/05/2015  . Osteopenia 03/03/2014  . Type 2 diabetes mellitus with microalbuminuria or microproteinuria 03/03/2014  . Hypothyroid 02/28/2014  . Anxiety and depression 02/28/2014   Past Surgical History:  Procedure Laterality Date  . APPENDECTOMY  02/28/11  . CESAREAN SECTION    . LAPAROSCOPIC APPENDECTOMY  03/03/2011   Procedure: APPENDECTOMY LAPAROSCOPIC;  Surgeon: Pedro Earls, MD;  Location: [facility];  Service: General;  Laterality: N/A;  . TONSILLECTOMY     Social History  Substance Use Topics  . Smoking status: Never Smoker  . Smokeless tobacco: Never Used  . Alcohol use No   Family History  Problem Relation Age of Onset  . Cancer Mother     breast  . Diabetes Mother   . Hypertension  Mother   . Stroke Mother   . Cancer Father     axillary tumor     Current medication list and allergy/intolerance information reviewed:   Current Outpatient Prescriptions on File Prior to Visit  Medication Sig Dispense Refill  . AMBULATORY NON FORMULARY MEDICATION Accucheck  Aviva lancets and test strips 100 each 11  . aspirin 81 MG tablet Take 1 tablet (81 mg total) by mouth daily. 30 tablet   . atorvastatin (LIPITOR) 10 MG tablet Take 1 tablet (10 mg total) by mouth daily. 90 tablet 0  . blood glucose meter kit and supplies KIT Dispense based on patient and insurance preference. Use up to four times daily as directed. Please include lancets, test strips, control solution. 1 each 1  . carisoprodol (SOMA) 350 MG tablet Take 1 tablet (350 mg total) by mouth 3 (three) times daily as needed (musculo-skeletal pain). 20 tablet 0  . citalopram (CELEXA) 40 MG tablet Take 1 tablet (40 mg total) by mouth daily. 90 tablet 0  . clonazePAM (KLONOPIN) 0.5 MG tablet Take 1 tablet (0.5 mg total) by mouth 3 (three) times daily as needed for anxiety. #90(ninety) tablets for #30(thirty) days 90 tablet 0  . diclofenac sodium (VOLTAREN) 1 % GEL Apply 4 g topically 4 (four) times daily. To affected joint. 100 g 11  . glucose blood test strip Use up to 4 times per day as directed with glucometer. Disp: 100. Refill x99 100 each 99  . insulin NPH-regular Human (NOVOLIN 70/30) (70-30) 100 UNIT/ML injection Inject  into the skin. 8 UNITS AM, 6 UNITS PM. FOLLOWING W/ ENDOCRINOLOGY DR. Hartford Poli    . levothyroxine (SYNTHROID, LEVOTHROID) 88 MCG tablet Take 1 tablet (88 mcg total) by mouth daily. FOLLOWING WITH ENDOCRINOLOGY DR LEVY 1 tablet 0  . metFORMIN (GLUCOPHAGE-XR) 500 MG 24 hr tablet Take by mouth. FOLLOWING W/ ENDOCRINOLOGY DR. Hartford Poli    . methocarbamol (ROBAXIN) 500 MG tablet Take 1 tablet (500 mg total) by mouth at bedtime as needed for muscle spasms. 30 tablet 0  . oxyCODONE-acetaminophen (ROXICET) 5-325 MG tablet Take  1 tablet by mouth every 8 (eight) hours as needed for severe pain. 20 tablet 0   No current facility-administered medications on file prior to visit.    Allergies  Allergen Reactions  . Codeine Hives and Nausea And Vomiting  . Glimepiride Hives  . Keflex [Cephalexin]     hives  . Metformin And Related     diarrhea  . Penicillins   . Septra [Sulfamethoxazole-Trimethoprim] Hives  . Sulfa Antibiotics Hives and Nausea And Vomiting      Review of Systems:  Constitutional: No recent illness  HEENT: No  headache, no vision change  Cardiac: No  chest pain, No  pressure, No palpitations  Respiratory:  No  shortness of breath. No  Cough  Gastrointestinal: No  abdominal pain, no change on bowel habits  Musculoskeletal: No new myalgia/arthralgia  Skin: No  Rash  Hem/Onc: No  easy bruising/bleeding, No  abnormal lumps/bumps  Neurologic: No  weakness, No  Dizziness  Psychiatric: No  concerns with depression, No  concerns with anxiety  Exam:  BP (!) 148/79   Pulse (!) 107   Wt 136 lb (61.7 kg)   BMI 21.30 kg/m   Constitutional: VS see above. General Appearance: alert, well-developed, well-nourished, NAD  Eyes: Normal lids and conjunctive, non-icteric sclera  Ears, Nose, Mouth, Throat: MMM, Normal external inspection ears/nares/mouth/lips/gums.  Neck: No masses, trachea midline.   Respiratory: Normal respiratory effort. no wheeze, no rhonchi, no rales  Cardiovascular: S1/S2 normal, no murmur, no rub/gallop auscultated. RRR.   Musculoskeletal: Gait normal. Symmetric and independent movement of all extremities  Neurological: Normal balance/coordination. No tremor.  Skin: warm, dry, intact.   Psychiatric: Normal judgment/insight. Normal mood and affect. Oriented x3.    Results for orders placed or performed in visit on 02/01/16 (from the past 72 hour(s))  POCT HgB A1C     Status: None   Collection Time: 02/01/16  1:42 PM  Result Value Ref Range   Hemoglobin A1C  6.6       ASSESSMENT/PLAN:   Type 2 diabetes mellitus with microalbuminuria, unspecified long term insulin use status ([place]) - Continue following with endocrinology. - Plan: POCT HgB A1C  History of osteoporosis - Due for repeat DEXA scan, advised patient unlikely this has anything to do with current other musculoskeletal pain/complaints, she is following with sports medi  Anxiety and depression - Refill of clonazepam 3 months due to upcoming travel - Plan: citalopram (CELEXA) 40 MG tablet, clonazePAM (KLONOPIN) 1 MG tablet   Visit summary with medication list and pertinent instructions was printed for patient to review. All questions at time of visit were answered - patient instructed to contact office with any additional concerns. ER/RTC precautions were reviewed with the patient. Follow-up plan: Return in about 4 months (around 06/01/2016) for [place] or sooner if needed.  Note: Total time spent 25 minutes, greater than 50% of the visit was spent face-to-face counseling and coordinating care for the following:  The primary encounter diagnosis was Type 2 diabetes mellitus with microalbuminuria, unspecified long term insulin use status ([place]). Diagnoses of History of osteoporosis and Anxiety and depression were also pertinent to this visit.[name]

## 2016-02-02 ENCOUNTER — Ambulatory Visit (INDEPENDENT_AMBULATORY_CARE_PROVIDER_SITE_OTHER): Payer: Medicare PPO | Admitting: Family Medicine

## 2016-02-02 ENCOUNTER — Ambulatory Visit (INDEPENDENT_AMBULATORY_CARE_PROVIDER_SITE_OTHER): Payer: Medicare PPO | Admitting: Physical Therapy

## 2016-02-02 ENCOUNTER — Encounter: Payer: Self-pay | Admitting: Family Medicine

## 2016-02-02 ENCOUNTER — Encounter: Payer: Self-pay | Admitting: Physical Therapy

## 2016-02-02 VITALS — BP 158/79 | HR 77

## 2016-02-02 DIAGNOSIS — M2560 Stiffness of unspecified joint, not elsewhere classified: Secondary | ICD-10-CM

## 2016-02-02 DIAGNOSIS — M654 Radial styloid tenosynovitis [de Quervain]: Secondary | ICD-10-CM | POA: Diagnosis not present

## 2016-02-02 DIAGNOSIS — M6281 Muscle weakness (generalized): Secondary | ICD-10-CM | POA: Diagnosis not present

## 2016-02-02 DIAGNOSIS — G8929 Other chronic pain: Secondary | ICD-10-CM | POA: Diagnosis not present

## 2016-02-02 DIAGNOSIS — M545 Low back pain: Secondary | ICD-10-CM | POA: Diagnosis not present

## 2016-02-02 DIAGNOSIS — M2569 Stiffness of other specified joint, not elsewhere classified: Secondary | ICD-10-CM

## 2016-02-02 NOTE — Progress Notes (Signed)
Veronica [name] is a 75 y.o. female who presents to [facility] today for follow-up right wrist pain. Patient has considerable right radial wrist pain. She was seen previously in late November and diagnosed with de Quervain's tenosynovitis. She was treated with ultrasound-guided tendon sheath injection. This provided immediate pain relief which only last for 5 days. She notes continued pain and has worsened. She has pain with any hand or wrist activity is interfering with her ability to take care of herself at home. Her pain to be very frustrating. She has not used any significant bracing.   Past Medical History:  Diagnosis Date  . Anemia   . Diabetic retinopathy ([place]) 03/23/2015  . GERD (gastroesophageal reflux disease) 01/05/2015  . Hypothyroid   . Osteopenia 03/03/2014  . Type 2 diabetes mellitus with microalbuminuria or microproteinuria 03/03/2014   Actos added January 2016.     Past Surgical History:  Procedure Laterality Date  . APPENDECTOMY  02/28/11  . CESAREAN SECTION    . LAPAROSCOPIC APPENDECTOMY  03/03/2011   Procedure: APPENDECTOMY LAPAROSCOPIC;  Surgeon: Pedro Earls, MD;  Location: [facility];  Service: General;  Laterality: N/A;  . TONSILLECTOMY     Social History  Substance Use Topics  . Smoking status: Never Smoker  . Smokeless tobacco: Never Used  . Alcohol use No     ROS:  As above   Medications: Current Outpatient Prescriptions  Medication Sig Dispense Refill  . AMBULATORY NON FORMULARY MEDICATION Accucheck  Aviva lancets and test strips 100 each 11  . aspirin 81 MG tablet Take 1 tablet (81 mg total) by mouth daily. 30 tablet   . atorvastatin (LIPITOR) 10 MG tablet Take 1 tablet (10 mg total) by mouth daily. 90 tablet 0  . blood glucose meter kit and supplies KIT Dispense based on patient and insurance preference. Use up to four times daily as directed. Please include lancets, test strips, control solution. 1 each  1  . carisoprodol (SOMA) 350 MG tablet Take 1 tablet (350 mg total) by mouth 3 (three) times daily as needed (musculo-skeletal pain). 20 tablet 0  . citalopram (CELEXA) 40 MG tablet Take 1 tablet (40 mg total) by mouth daily. 90 tablet 3  . clonazePAM (KLONOPIN) 1 MG tablet Take 0.5 tablets (0.5 mg total) by mouth 2 (two) times daily as needed for anxiety. #90(ninety) tablets for 90(ninety) days supply 90 tablet 0  . diclofenac sodium (VOLTAREN) 1 % GEL Apply 4 g topically 4 (four) times daily. To affected joint. 100 g 11  . glucose blood test strip Use up to 4 times per day as directed with glucometer. Disp: 100. Refill x99 100 each 99  . insulin NPH-regular Human (NOVOLIN 70/30) (70-30) 100 UNIT/ML injection Inject into the skin. 8 UNITS AM, 6 UNITS PM. FOLLOWING W/ ENDOCRINOLOGY DR. Hartford Poli    . levothyroxine (SYNTHROID, LEVOTHROID) 88 MCG tablet Take 1 tablet (88 mcg total) by mouth daily. FOLLOWING WITH ENDOCRINOLOGY DR LEVY 1 tablet 0  . metFORMIN (GLUCOPHAGE-XR) 500 MG 24 hr tablet Take by mouth. FOLLOWING W/ ENDOCRINOLOGY DR. Hartford Poli    . methocarbamol (ROBAXIN) 500 MG tablet Take 1 tablet (500 mg total) by mouth at bedtime as needed for muscle spasms. 30 tablet 0  . oxyCODONE-acetaminophen (ROXICET) 5-325 MG tablet Take 1 tablet by mouth every 8 (eight) hours as needed for severe pain. 20 tablet 0   No current facility-administered medications for this visit.    Allergies  Allergen Reactions  .  Codeine Hives and Nausea And Vomiting  . Glimepiride Hives  . Keflex [Cephalexin]     hives  . Metformin And Related     diarrhea  . Penicillins   . Septra [Sulfamethoxazole-Trimethoprim] Hives  . Sulfa Antibiotics Hives and Nausea And Vomiting     Exam:  BP (!) 158/79   Pulse 77  General: Well Developed, well nourished, and in no acute distress.  Neuro/Psych: Alert and oriented x3, extra-ocular muscles intact, able to move all 4 extremities, sensation grossly intact. Skin: Warm and dry, no  rashes noted.  Respiratory: Not using accessory muscles, speaking in full sentences, trachea midline.  Cardiovascular: Pulses palpable, no extremity edema. Abdomen: Does not appear distended. MSK: Right wrist slightly swollen overlying the radial styloid. Tender palpation overlying the radial styloid. Positive Finkelstein's test.    Results for orders placed or performed in visit on 02/01/16 (from the past 48 hour(s))  POCT HgB A1C     Status: None   Collection Time: 02/01/16  1:42 PM  Result Value Ref [facility]   Hemoglobin A1C 6.6    No results found.    Assessment and Plan: 75 y.o. female with De Quervain's tenosynovitis. Plan for thumb spica splint and referral to either hand or regular physical therapy. Recheck in a month or so. If not better would consider MRI. This is an established problem that has worsened with an uncertain prognosis.    Orders Placed This Encounter  Procedures  . Ambulatory referral to Physical Therapy    Referral Priority:   Routine    Referral Type:   Physical Medicine    Referral Reason:   Specialty Services Required    Requested Specialty:   Physical Therapy    Number of Visits Requested:   1  . Ambulatory referral to Physical Therapy    Referral Priority:   Routine    Referral Type:   Physical Medicine    Referral Reason:   Specialty Services Required    Requested Specialty:   Physical Therapy    Number of Visits Requested:   1    Discussed warning signs or symptoms. Please see discharge instructions. Patient expresses understanding.

## 2016-02-02 NOTE — Therapy (Signed)
[facility] [address] [address] [place], [place], [address] Phone: [phone]   Fax:  [phone]  Physical Therapy Treatment  Patient Details  Name: Veronica [name] MRN: [id number] Date of Birth: Oct 14, 1940 Referring Provider: Dr Steva Colder  Encounter Date: 02/02/2016      PT End of Session - 02/02/16 1152    Visit Number 3   Number of Visits 8   Date for PT Re-Evaluation 02/18/16   PT Start Time 5009   PT Stop Time 1245   PT Time Calculation (min) 52 min   Activity Tolerance Patient tolerated treatment well      Past Medical History:  Diagnosis Date  . Anemia   . Diabetic retinopathy ([place]) 03/23/2015  . GERD (gastroesophageal reflux disease) 01/05/2015  . Hypothyroid   . Osteopenia 03/03/2014  . Type 2 diabetes mellitus with microalbuminuria or microproteinuria 03/03/2014   Actos added January 2016.      Past Surgical History:  Procedure Laterality Date  . APPENDECTOMY  02/28/11  . CESAREAN SECTION    . LAPAROSCOPIC APPENDECTOMY  03/03/2011   Procedure: APPENDECTOMY LAPAROSCOPIC;  Surgeon: Pedro Earls, MD;  Location: [facility];  Service: General;  Laterality: N/A;  . TONSILLECTOMY      There were no vitals filed for this visit.      Subjective Assessment - 02/02/16 1153    Subjective Pt reports her back is improving. States it is about 50% better. She has a new problem now with her Rt wrist/hand, MD is supposed to be placing an order for this.    Patient Stated Goals exercise and work out again without pain. Swim, [employer]   Currently in Pain? Yes   Pain Score 5    Pain Location Back   Pain Orientation Right   Pain Descriptors / Indicators Burning;Stabbing;Shooting   Pain Radiating Towards less radiating into her Rt knee and calf   Pain Onset More than a month ago   Pain Frequency Intermittent   Aggravating Factors  treadmill walking   Pain Relieving Factors medicine as needed, heat            [facility] PT  Assessment - 02/02/16 0001      Assessment   Medical Diagnosis LBP, Rt sided sciatica     Strength   Right/Left Hip Left;Right   Right Hip Flexion --  5-/5   Right Hip Extension 4/5   Right Hip ABduction 5/5   Left Hip Flexion 5/5   Left Hip Extension 5/5   Left Hip ABduction 5/5                     [facility] Adult PT Treatment/Exercise - 02/02/16 0001      Lumbar Exercises: Stretches   ITB Stretch 3 reps;30 seconds  cross body with strap     Lumbar Exercises: Aerobic   Stationary Bike nustep L4 x 5 min      Lumbar Exercises: Supine   Isometric Hip Flexion 10 reps;5 seconds  each side     Lumbar Exercises: Prone   Opposite Arm/Leg Raise Left arm/Right leg;Right arm/Left leg;15 reps   Other Prone Lumbar Exercises 10reps lower body lifts, 3x5 lift with heel taps.      Modalities   Modalities Electrical Stimulation;Moist Heat     Moist Heat Therapy   Number Minutes Moist Heat 15 Minutes   Moist Heat Location Lumbar Spine  buttocks     Electrical Stimulation   Electrical Stimulation Location  Rt lumbar and buttocks   Electrical Stimulation Action IFC   Electrical Stimulation Parameters to tolerance   Electrical Stimulation Goals Pain;Tone     Manual Therapy   Manual Therapy Soft tissue mobilization   Soft tissue mobilization STW to Rt buttocks and low back glutes tight today           Trigger Point Dry Needling - 02/02/16 1213    Consent Given? Yes   Muscles Treated Upper Body Quadratus Lumborum  palpable increase in tissue extensibility Rt    Longissimus Response Palpable increased muscle length  Rt   Gluteus Maximus Response Palpable increased muscle length  Rt                   PT Long Term Goals - 02/02/16 1200      PT LONG TERM GOAL #1   Title I with advanced HEP to include resuming weight training and light jogging ( 02/18/16)    Status On-going     PT LONG TERM GOAL #2   Title report =/> 75% reduction of pain when she is  in bed ( 02/18/16)    Status On-going  50% improvement     PT LONG TERM GOAL #3   Title demo Rt hip/knee strength =/> Lt ( 02/18/16)    Status Partially Met     PT LONG TERM GOAL #4   Title report no tenderness with palpation in the rt low back and buttocks ( 02/18/16)    Status On-going     PT LONG TERM GOAL #5   Title improve FOTO =/< 48% limited, CK level ( 02/18/16)    Status On-going               Plan - 02/02/16 1223    Clinical Impression Statement Veronica [name] has improved strength in her Rt hip, and some decrease in tightness in the Rt hip complex. Partially met som eof her goals and progressing to the others.    Rehab Potential Good   PT Frequency 2x / week   PT Duration 4 weeks   PT Treatment/Interventions Moist Heat;Ultrasound;Therapeutic exercise;Dry needling;Taping;Manual techniques;Neuromuscular re-education;Cryotherapy;Electrical Stimulation;Functional mobility training;Patient/family education   PT Next Visit Plan continue with TDN, core stability and Rt LE strengthening. Manual work to [employer] buttocks/low back PRN   Consulted and Agree with Plan of Care Patient      Patient will benefit from skilled therapeutic intervention in order to improve the following deficits and impairments:  Postural dysfunction, Decreased strength, Pain, Hypomobility, Increased muscle spasms, Decreased range of motion  Visit Diagnosis: Chronic right-sided low back pain, with sciatica presence unspecified  Muscle weakness (generalized)  Back stiffness     Problem List Patient Active Problem List   Diagnosis Date Noted  . De Quervain's tenosynovitis, right 01/20/2016  . Lumbago 01/18/2016  . Hypercalcemia 12/28/2015  . Ganglion cyst of wrist 10/27/2015  . Sciatica of right side 10/27/2015  . Right knee pain 10/27/2015  . Diabetic retinopathy ([place]) 03/23/2015  . GERD (gastroesophageal reflux disease) 01/05/2015  . Osteopenia 03/03/2014  . Type 2 diabetes mellitus with  microalbuminuria or microproteinuria 03/03/2014  . Hypothyroid 02/28/2014  . Anxiety and depression 02/28/2014    Jeral Pinch PT  02/02/2016, 12:37 PM  [facility] [place] [address] [place] [place], [place], [address] Phone: [phone]   Fax:  [phone]  Name: Veronica [name] MRN: [id number] Date of Birth: 1941-02-19

## 2016-02-02 NOTE — Patient Instructions (Signed)
Thank you for coming in today. Attend Regular PT.  If not improving attend Hand PT in [place].  Return in 1 month or so if not improving.  Use the wrist brace as needed for stabilization and comfort at home.  Veronica [name] Quervain Disease Introduction Veronica SailsDe Quervain disease is inflammation of the tendon on the thumb side of the wrist. Tendons are cords of tissue that connect bones to muscles. The tendons in your hand pass through a tunnel, or sheath. A slippery layer of tissue (synovium) lets the tendons move smoothly in the sheath. With de Quervain disease, the sheath swells or thickens, causing friction and pain. The condition is also called de Quervain tendinosis and de Quervain syndrome. It occurs most often in women who are 2930-77 years old. What are the causes? The exact cause of de Quervain disease is not known. It may result from:  Overusing your hands, especially with repetitive motions that involve twisting your hand or using a forceful grip.  Pregnancy.  Rheumatoid disease. What increases the risk? You may have a greater risk for de Quervain disease if you:  Are a middle-aged woman.  Are pregnant.  Have rheumatoid arthritis.  Have diabetes.  Use your hands far more than normal, especially with a tight grip or excessive twisting. What are the signs or symptoms? Pain on the thumb side of your wrist is the main symptom of de Quervain disease. Other signs and symptoms include:  Pain that gets worse when you grasp something or turn your wrist.  Pain that extends up the forearm.  Cysts in the area of the pain.  Swelling of your wrist and hand.  A sensation of snapping in the wrist.  Trouble moving the thumb and wrist. How is this diagnosed? Your health care provider may diagnose de Quervain disease based on your signs and symptoms. A physical exam will also be done. A simple test Lourena Simmonds(Finkelstein test) that involves pulling your thumb and wrist to see if this causes pain can help  determine whether you have the condition. Sometimes you may need to have an X-ray. How is this treated? Avoiding any activity that causes pain and swelling is the best treatment. Other options include:  Wearing a splint.  Taking medicine. Anti-inflammatory medicines and corticosteroid injections may reduce inflammation and relieve pain.  Having surgery if other treatments do not work. Follow these instructions at home:  Using ice can be helpful after doing activities that involve the sore wrist. To apply ice to the injured area:  Put ice in a plastic bag.  Place a towel between your skin and the bag.  Leave the ice on for 20 minutes, 2-3 times a day.  Take medicines only as directed by your health care provider.  Wear your splint as directed. This will allow your hand to rest and heal. Contact a health care provider if:  Your pain medicine does not help.  Your pain gets worse.  You develop new symptoms. This information is not intended to replace advice given to you by your health care provider. Make sure you discuss any questions you have with your health care provider. Document Released: 11/02/2000 Document Revised: 07/16/2015 Document Reviewed: 06/12/2013  2017 Elsevier

## 2016-02-04 ENCOUNTER — Ambulatory Visit (INDEPENDENT_AMBULATORY_CARE_PROVIDER_SITE_OTHER): Payer: Medicare PPO | Admitting: Physical Therapy

## 2016-02-04 ENCOUNTER — Encounter: Payer: Self-pay | Admitting: Physical Therapy

## 2016-02-04 DIAGNOSIS — M25531 Pain in right wrist: Secondary | ICD-10-CM

## 2016-02-04 DIAGNOSIS — M6281 Muscle weakness (generalized): Secondary | ICD-10-CM

## 2016-02-04 DIAGNOSIS — M545 Low back pain: Secondary | ICD-10-CM

## 2016-02-04 DIAGNOSIS — M2560 Stiffness of unspecified joint, not elsewhere classified: Secondary | ICD-10-CM | POA: Diagnosis not present

## 2016-02-04 DIAGNOSIS — M25631 Stiffness of right wrist, not elsewhere classified: Secondary | ICD-10-CM

## 2016-02-04 DIAGNOSIS — M2569 Stiffness of other specified joint, not elsewhere classified: Secondary | ICD-10-CM

## 2016-02-04 DIAGNOSIS — G8929 Other chronic pain: Secondary | ICD-10-CM

## 2016-02-04 NOTE — Patient Instructions (Addendum)
Wrist Flexor Stretch    Keeping elbow straight, grasp left hand and slowly bend wrist back until stretch is felt. Hold _30___ seconds. Relax. Repeat __1__ times per set. Do __1__ sets per session. Do _2___ sessions per day.   Wrist Extensor Stretch    Keeping elbow straight, grasp left hand and slowly bend wrist forward until stretch is felt. Hold __30__ seconds. Relax. Repeat __1__ times per set. Do __1__ sets per session. Do __2__ sessions per day.  AROM: Thumb Flexion / Extension    Actively bend right thumb across palm as far as possible. Hold __30__ seconds. Relax. Then pull thumb back into hitchhike position. Repeat __1__ times per set. Do __1__ sets per session. Do _2__ sessions per day.  Towel Roll Squeeze    With right forearm resting on surface, gently squeeze towel. Repeat __10-20__ times per set. Do __1__ sets per session. Do __2__ sessions per day.  Cross friction massage to the base of the thumb and forearm for 3-5 minutes, then ice for 10-12 min after.  Copyright  VHI. All rights reserved.

## 2016-02-04 NOTE — Therapy (Signed)
[place], [place], [address] Phone: [phone]   Fax:  [phone]  Physical Therapy Treatment  Patient Details  Name: Veronica [name] MRN: [id number] Date of Birth: 06-09-1940 Referring Provider: Dr Georgina Snell  Encounter Date: 02/04/2016      PT End of Session - 02/04/16 1429    Visit Number 4   Number of Visits 12   Date for PT Re-Evaluation 03/03/16   PT Start Time 6606   PT Stop Time 1528   PT Time Calculation (min) 59 min   Activity Tolerance Patient tolerated treatment well      Past Medical History:  Diagnosis Date  . Anemia   . Diabetic retinopathy ([place]) 03/23/2015  . GERD (gastroesophageal reflux disease) 01/05/2015  . Hypothyroid   . Osteopenia 03/03/2014  . Type 2 diabetes mellitus with microalbuminuria or microproteinuria 03/03/2014   Actos added January 2016.      Past Surgical History:  Procedure Laterality Date  . APPENDECTOMY  02/28/11  . CESAREAN SECTION    . LAPAROSCOPIC APPENDECTOMY  03/03/2011   Procedure: APPENDECTOMY LAPAROSCOPIC;  Surgeon: Pedro Earls, MD;  Location: [facility];  Service: General;  Laterality: N/A;  . TONSILLECTOMY      There were no vitals filed for this visit.      Subjective Assessment - 02/04/16 1436    Subjective Pt having Rt wrist /hand issues, she had a ganglion cytst, had fluid removed, tried steriods with minimal relief.  Wants that to improve also. Feels like her back is getting better however had some tingling into the Rt LE, it is gone now.    Patient Stated Goals exercise and work out again without pain. Swim, [employer] use scissors, stop dropping items, play flute and piano again   Currently in Pain? Yes   Pain Score 9    Pain Location Wrist   Pain Orientation Right  thumb    Pain Descriptors / Indicators Burning   Pain Type Acute pain   Pain Onset More than a month ago   Pain Frequency Constant   Aggravating Factors  working and  twisting the thumb down.    Pain Relieving Factors brace, heat            [facility] PT Assessment - 02/04/16 0001      Assessment   Medical Diagnosis LBP, Rt sided sciatica, Rt DeQuerveins   Referring Provider Dr Georgina Snell   Onset Date/Surgical Date 07/21/15   Hand Dominance Right     Precautions   Precautions Other (comment)   Required Braces or Orthoses --  Rt wrist cock up splint     ROM / Strength   AROM / PROM / Strength AROM;PROM;Strength     AROM   Overall AROM Comments Rt elbow, shoulder & forearm WNL   AROM Assessment Site Wrist   Right/Left Wrist Right   Right Wrist Extension 67 Degrees   Right Wrist Flexion 68 Degrees   Right Wrist Radial Deviation 8 Degrees   Right Wrist Ulnar Deviation 34 Degrees     PROM   PROM Assessment Site Wrist   Right/Left Wrist Right   Right Wrist Extension 88 Degrees   Right Wrist Flexion 78 Degrees   Right Wrist Radial Deviation 24 Degrees     Strength   Strength Assessment Site Wrist;Shoulder;Elbow;Hand   Right/Left Shoulder --  Lt WNL Rt grossly 5-/5   Right/Left Elbow --  Lt WNL, Rt bicep 5/5, tricep 4/5   Right/Left  Wrist Right   Right Wrist Flexion 4/5   Right Wrist Extension 4-/5   Right Wrist Ulnar Deviation 4+/5   Right/Left hand Left;Right   Right Hand Grip (lbs) 53   Left Hand Grip (lbs) 47     Palpation   Palpation comment hypomobile in Rt carpals                     [facility] Adult PT Treatment/Exercise - 02/04/16 0001      Modalities   Modalities Electrical Stimulation;Cryotherapy;Iontophoresis;Moist Heat     Moist Heat Therapy   Number Minutes Moist Heat 15 Minutes   Moist Heat Location Lumbar Spine     Cryotherapy   Number Minutes Cryotherapy 15 Minutes   Cryotherapy Location Wrist  Rt   Type of Cryotherapy Ice pack     Electrical Stimulation   Electrical Stimulation Location Rt lumbar and buttocks   Electrical Stimulation Action IFC   Electrical Stimulation Parameters to tolerance    Electrical Stimulation Goals Pain;Tone     Iontophoresis   Type of Iontophoresis Dexamethasone   Location base of Rt thumb   Dose 1.3cc   Time 13 hr spider patch     Manual Therapy   Manual Therapy Soft tissue mobilization   Soft tissue mobilization Rt wrist and forearm                PT Education - 02/04/16 1514    Education provided Yes   Education Details HEP   Person(s) Educated Patient   Methods Explanation;Demonstration;Handout   Comprehension Returned demonstration;Verbalized understanding             PT Long Term Goals - 02/04/16 1518      PT LONG TERM GOAL #1   Title I with advanced HEP to include resuming weight training and light jogging ( 03/03/16   Time 4   Period Weeks   Status On-going     PT LONG TERM GOAL #2   Title report =/> 75% reduction of pain when she is in bed ( 03/03/16   Time 4   Period Weeks   Status On-going     PT LONG TERM GOAL #3   Title demo Rt hip/knee strength =/> Lt ( 03/03/16)   Time 4   Period Weeks   Status Partially Met     PT LONG TERM GOAL #4   Title report no tenderness with palpation in the rt low back and buttocks ( 03/03/16)   Time 4   Period Weeks   Status On-going     PT LONG TERM GOAL #5   Title improve FOTO =/< 48% limited, CK level ( 03/03/16)   Time 4   Period Weeks   Status On-going     PT LONG TERM GOAL #6   Title demo Rt wrist ROM WNL and painfree ( 03/03/16)    Time 4   Period Weeks   Status New     PT LONG TERM GOAL #7   Title demo Rt wrist and elbow =/> 5-/5 without pain ( 03/03/16)    Time 4   Period Weeks   Status New     PT LONG TERM GOAL #8   Title report =/> 75% reduction in Rt wrist pain ( 03/03/16)    Time 4   Period Weeks   Status New               Plan - 02/04/16 1514    Clinical Impression Statement  Pt was seen today for evaluation of her Rt wrist, goals were set for this and plan of care revised.  She was pain with all wrist ROM and resistance and some stiffness.   Her back is improving however still troublesome.    Rehab Potential Good   PT Frequency 2x / week   PT Duration 4 weeks   PT Treatment/Interventions Moist Heat;Ultrasound;Therapeutic exercise;Dry needling;Taping;Manual techniques;Neuromuscular re-education;Cryotherapy;Electrical Stimulation;Functional mobility training;Patient/family education;Iontophoresis 24m/ml Dexamethasone;Vasopneumatic Device   PT Next Visit Plan assess response to ionto on wrist, continue with TDN and add in wrist work.       Patient will benefit from skilled therapeutic intervention in order to improve the following deficits and impairments:  Postural dysfunction, Decreased strength, Pain, Hypomobility, Increased muscle spasms, Decreased range of motion  Visit Diagnosis: Chronic right-sided low back pain, with sciatica presence unspecified - Plan: PT plan of care cert/re-cert  Muscle weakness (generalized) - Plan: PT plan of care cert/re-cert  Back stiffness - Plan: PT plan of care cert/re-cert  Pain in right wrist - Plan: PT plan of care cert/re-cert  Stiffness of right wrist, not elsewhere classified - Plan: PT plan of care cert/re-cert     Problem List Patient Active Problem List   Diagnosis Date Noted  . De Quervain's tenosynovitis, right 01/20/2016  . Lumbago 01/18/2016  . Hypercalcemia 12/28/2015  . Ganglion cyst of wrist 10/27/2015  . Sciatica of right side 10/27/2015  . Right knee pain 10/27/2015  . [place] ([place] 03/23/2015  . GERD (gastroesophageal reflux disease) 01/05/2015  . Osteopenia 03/03/2014  . Type 2 diabetes mellitus with microalbuminuria or microproteinuria 03/03/2014  . Hypothyroid 02/28/2014  . Anxiety and depression 02/28/2014    SJeral PinchPT  02/04/2016, 3:23 PM  [facility] [address]Phone: [phone]  Fax:  [phone] Name: Veronica HanbackMRN:  [id number] of Birth: [birth year]

## 2016-02-09 ENCOUNTER — Ambulatory Visit (INDEPENDENT_AMBULATORY_CARE_PROVIDER_SITE_OTHER): Payer: Medicare PPO | Admitting: Physical Therapy

## 2016-02-09 ENCOUNTER — Telehealth: Payer: Self-pay

## 2016-02-09 DIAGNOSIS — M2569 Stiffness of other specified joint, not elsewhere classified: Secondary | ICD-10-CM

## 2016-02-09 DIAGNOSIS — M25531 Pain in right wrist: Secondary | ICD-10-CM

## 2016-02-09 DIAGNOSIS — M6281 Muscle weakness (generalized): Secondary | ICD-10-CM

## 2016-02-09 DIAGNOSIS — M545 Low back pain: Secondary | ICD-10-CM | POA: Diagnosis not present

## 2016-02-09 DIAGNOSIS — M2560 Stiffness of unspecified joint, not elsewhere classified: Secondary | ICD-10-CM | POA: Diagnosis not present

## 2016-02-09 DIAGNOSIS — F329 Major depressive disorder, single episode, unspecified: Secondary | ICD-10-CM

## 2016-02-09 DIAGNOSIS — G8929 Other chronic pain: Secondary | ICD-10-CM

## 2016-02-09 DIAGNOSIS — F419 Anxiety disorder, unspecified: Principal | ICD-10-CM

## 2016-02-09 DIAGNOSIS — M25631 Stiffness of right wrist, not elsewhere classified: Secondary | ICD-10-CM

## 2016-02-09 DIAGNOSIS — F32A Depression, unspecified: Secondary | ICD-10-CM

## 2016-02-09 NOTE — Therapy (Signed)
[place], [place], [address] Phone: [phone]   Fax:  [phone]  Physical Therapy Treatment  Patient Details  Name: Veronica [name] MRN: [id number] Date of Birth: 02/13/1941 Referring Provider: Dr Georgina Snell  Encounter Date: 02/09/2016      PT End of Session - 02/09/16 1152    Visit Number 5   Number of Visits 12   Date for PT Re-Evaluation 03/03/16   PT Start Time 1152   PT Stop Time 1253   PT Time Calculation (min) 61 min   Activity Tolerance Patient tolerated treatment well      Past Medical History:  Diagnosis Date  . Anemia   . Diabetic retinopathy ([place]) 03/23/2015  . GERD (gastroesophageal reflux disease) 01/05/2015  . Hypothyroid   . Osteopenia 03/03/2014  . Type 2 diabetes mellitus with microalbuminuria or microproteinuria 03/03/2014   Actos added January 2016.      Past Surgical History:  Procedure Laterality Date  . APPENDECTOMY  02/28/11  . CESAREAN SECTION    . LAPAROSCOPIC APPENDECTOMY  03/03/2011   Procedure: APPENDECTOMY LAPAROSCOPIC;  Surgeon: Pedro Earls, MD;  Location: [facility];  Service: General;  Laterality: N/A;  . TONSILLECTOMY      There were no vitals filed for this visit.      Subjective Assessment - 02/09/16 1153    Currently in Pain? Yes   Pain Score 8    Pain Location Wrist   Pain Orientation Right  by thumb   Pain Descriptors / Indicators Burning   Pain Type Acute pain   Pain Onset More than a month ago   Pain Frequency Constant   Multiple Pain Sites Yes   Pain Score 4   Pain Location Back   Pain Orientation Right   Pain Descriptors / Indicators Aching   Pain Type Chronic pain   Pain Radiating Towards to the knee then into calf   Pain Onset More than a month ago   Pain Frequency Constant   Aggravating Factors  not sure   Pain Relieving Factors heat, stretches            [facility] PT Assessment - 02/09/16 0001      Assessment   Medical  Diagnosis LBP, Rt sided sciatica, Rt DeQuerveins                     [facility] Adult PT Treatment/Exercise - 02/09/16 0001      Lumbar Exercises: Stretches   Piriformis Stretch 2 reps;30 seconds  Rt ankle on left knee     Lumbar Exercises: Supine   Clam 20 reps  each side   Bridge 20 reps  each side, figure 4's     Modalities   Modalities Electrical Stimulation;Moist Heat;Iontophoresis;Ultrasound;Cryotherapy     Moist Heat Therapy   Number Minutes Moist Heat 15 Minutes   Moist Heat Location Lumbar Spine  & gluts     Cryotherapy   Number Minutes Cryotherapy 15 Minutes   Cryotherapy Location Wrist  Rt   Type of Cryotherapy Ice pack     Electrical Stimulation   Electrical Stimulation Location Rt lumbar and gluts  in prone while treating wrist   Electrical Stimulation Action IFC   Electrical Stimulation Parameters  to tolerance    Electrical Stimulation Goals Pain;Tone     Ultrasound   Ultrasound Location Rt thumb   Ultrasound Parameters 50% 3.15mz,1.0 w/cm2   Ultrasound Goals Pain     Iontophoresis  Type of Iontophoresis Dexamethasone   Location base of Rt thumb   Dose 1.3cc   Time 13 hr spider patch     Manual Therapy   Manual Therapy Soft tissue mobilization   Soft tissue mobilization Rt gluts, QL and piriformis          Trigger Point Dry Needling - 02/09/16 1159    Consent Given? Yes   Education Handout Provided No   Muscles Treated Lower Body Gluteus maximus;Gluteus minimus   Gluteus Maximus Response Twitch response elicited;Palpable increased muscle length  Rt with stim   Gluteus Minimus Response Palpable increased muscle length;Twitch response elicited                   PT Long Term Goals - 02/09/16 1238      PT LONG TERM GOAL #1   Title I with advanced HEP to include resuming weight training and light jogging ( 03/03/16   Status On-going     PT LONG TERM GOAL #2   Title report =/> 75% reduction of pain when she is in bed  ( 03/03/16   Status On-going     PT LONG TERM GOAL #3   Title demo Rt hip/knee strength =/> Lt ( 03/03/16)   Status Partially Met     PT LONG TERM GOAL #4   Title report no tenderness with palpation in the rt low back and buttocks ( 03/03/16)   Status On-going     PT LONG TERM GOAL #5   Title improve FOTO =/< 48% limited, CK level ( 03/03/16)   Status On-going     PT LONG TERM GOAL #6   Title demo Rt wrist ROM WNL and painfree ( 03/03/16)    Status On-going     PT LONG TERM GOAL #7   Title demo Rt wrist and elbow =/> 5-/5 without pain ( 03/03/16)    Status On-going     PT LONG TERM GOAL #8   Title report =/> 75% reduction in Rt wrist pain ( 03/03/16)    Status On-going               Plan - 02/09/16 1239    Clinical Impression Statement Webb Silversmith has less tightness in her Rt QL, still has some in the gluts, responsed well to treatment today.  Its  the second visit for her wrist.  She reports the patch helped.     Rehab Potential Good   PT Frequency 2x / week   PT Duration 4 weeks   PT Next Visit Plan assess and request more visits from insurance company      Patient will benefit from skilled therapeutic intervention in order to improve the following deficits and impairments:  Postural dysfunction, Decreased strength, Pain, Hypomobility, Increased muscle spasms, Decreased range of motion  Visit Diagnosis: Chronic right-sided low back pain, with sciatica presence unspecified  Muscle weakness (generalized)  Back stiffness  Pain in right wrist  Stiffness of right wrist, not elsewhere classified     Problem List Patient Active Problem List   Diagnosis Date Noted  . De Quervain's tenosynovitis, right 01/20/2016  . Lumbago 01/18/2016  . Hypercalcemia 12/28/2015  . Ganglion cyst of wrist 10/27/2015  . Sciatica of right side 10/27/2015  . Right knee pain 10/27/2015  . Diabetic retinopathy ([place]) 03/23/2015  . GERD (gastroesophageal reflux disease) 01/05/2015  .  Osteopenia 03/03/2014  . Type 2 diabetes mellitus with microalbuminuria or microproteinuria 03/03/2014  . Hypothyroid 02/28/2014  . Anxiety  and depression 02/28/2014    Jeral Pinch PT  02/09/2016, 12:41 PM  [facility] [place], [place], [address] Phone: [phone]   Fax:  [phone]  Name: Abby Tucholski MRN: [id number] Date of Birth: 05/13/1940

## 2016-02-09 NOTE — Telephone Encounter (Signed)
Patient called requesting a Rx for Clonazepam 0.5 mg, that she takes during the day. Patient stated that she takes it 3 times a day. She stated that during her visit it was discussed that she breaks the 1 mg in half and take that at night.  I did not see any of that in the last OV note so Please advise. Rhonda Cunningham,CMA

## 2016-02-10 ENCOUNTER — Ambulatory Visit: Payer: Self-pay | Admitting: Family Medicine

## 2016-02-10 MED ORDER — CLONAZEPAM 0.5 MG PO TABS: 0.5000 mg | ORAL_TABLET | Freq: Three times a day (TID) | ORAL | 0 refills | Status: DC | PRN

## 2016-02-10 NOTE — Telephone Encounter (Signed)
Can call patient: If she has not filled the other prescription, we can have her bring it back and exchange it for an Rx for 3 times a day dosing. I believe she and I had discussed backing off on the dose to twice daily as this was typically what she was taking most days, but my documentation is not specific on this, my error. I would still recommend backing off on the medication to twice daily if she is able to do so.   Clinical note: trying to accommodate to make sure she has the medicine while traveling. Recognize that #270 is a large quantity to dispense

## 2016-02-10 NOTE — Telephone Encounter (Signed)
[facility] [address] Bjorn Loserhonda Cunningham,CMA

## 2016-02-11 ENCOUNTER — Ambulatory Visit (INDEPENDENT_AMBULATORY_CARE_PROVIDER_SITE_OTHER): Payer: Medicare PPO | Admitting: Physical Therapy

## 2016-02-11 ENCOUNTER — Encounter: Payer: Self-pay | Admitting: Physical Therapy

## 2016-02-11 DIAGNOSIS — M2560 Stiffness of unspecified joint, not elsewhere classified: Secondary | ICD-10-CM | POA: Diagnosis not present

## 2016-02-11 DIAGNOSIS — M6281 Muscle weakness (generalized): Secondary | ICD-10-CM | POA: Diagnosis not present

## 2016-02-11 DIAGNOSIS — M25531 Pain in right wrist: Secondary | ICD-10-CM | POA: Diagnosis not present

## 2016-02-11 DIAGNOSIS — M545 Low back pain: Secondary | ICD-10-CM

## 2016-02-11 DIAGNOSIS — G8929 Other chronic pain: Secondary | ICD-10-CM

## 2016-02-11 DIAGNOSIS — M2569 Stiffness of other specified joint, not elsewhere classified: Secondary | ICD-10-CM

## 2016-02-11 DIAGNOSIS — M25631 Stiffness of right wrist, not elsewhere classified: Secondary | ICD-10-CM

## 2016-02-11 NOTE — Therapy (Signed)
[place], [place], [address] Phone: [phone]   Fax:  [phone]  Physical Therapy Treatment  Patient Details  Name: Veronica [name] MRN: [id number] Date of Birth: [birth year] Referring Provider: Dr Georgina Snell  Encounter Date: 02/11/2016      PT End of Session - 02/11/16 1447    Visit Number 6   Number of Visits 12   Date for PT Re-Evaluation 03/03/16   PT Start Time 5625   PT Stop Time 1547   PT Time Calculation (min) 60 min      Past Medical History:  Diagnosis Date  . Anemia   . Diabetic retinopathy ([place]) 03/23/2015  . GERD (gastroesophageal reflux disease) 01/05/2015  . Hypothyroid   . Osteopenia 03/03/2014  . Type 2 diabetes mellitus with microalbuminuria or microproteinuria 03/03/2014   Actos added January 2016.      Past Surgical History:  Procedure Laterality Date  . APPENDECTOMY  02/28/11  . CESAREAN SECTION    . LAPAROSCOPIC APPENDECTOMY  03/03/2011   Procedure: APPENDECTOMY LAPAROSCOPIC;  Surgeon: Pedro Earls, MD;  Location: [facility];  Service: General;  Laterality: N/A;  . TONSILLECTOMY      There were no vitals filed for this visit.      Subjective Assessment - 02/11/16 1448    Subjective " whatever you did to my wrist helped"   Currently in Pain? Yes                         [facility] Adult PT Treatment/Exercise - 02/11/16 0001      Lumbar Exercises: Supine   Bridge 10 reps  regular then articulating, then knees in/out, figure 4   Other Supine Lumbar Exercises 5x10sec table top     Modalities   Modalities Electrical Stimulation;Moist Heat;Ultrasound     Moist Heat Therapy   Number Minutes Moist Heat 15 Minutes   Moist Heat Location Lumbar Spine     Cryotherapy   Number Minutes Cryotherapy 15 Minutes   Cryotherapy Location Wrist   Type of Cryotherapy Ice pack     Electrical Stimulation   Electrical Stimulation Location Rt lumbar and gluts    Electrical Stimulation Action IFC   Electrical Stimulation Parameters to tolerance   Electrical Stimulation Goals Pain;Tone     Ultrasound   Ultrasound Location Rt thumb   Ultrasound Parameters 50%, 3.3 mHz, 1.0w/cm2   Ultrasound Goals Pain     Iontophoresis   Type of Iontophoresis Dexamethasone   Location base of Rt thumb   Dose 1.3cc   Time 13 hr spider patch     Manual Therapy   Manual Therapy Soft tissue mobilization   Soft tissue mobilization Rt gluts, QL and piriformis          Trigger Point Dry Needling - 02/11/16 1659    Consent Given? Yes   Education Handout Provided No   Muscles Treated Upper Body Longissimus  L4-1   Muscles Treated Lower Body Gluteus maximus;Gluteus minimus   Gluteus Maximus Response Palpable increased muscle length;Twitch response elicited   Gluteus Minimus Response --  minimal reaction   Piriformis Response Palpable increased muscle length;Twitch response elicited                   PT Long Term Goals - 02/09/16 1238      PT LONG TERM GOAL #1   Title I with advanced HEP to include resuming weight training and light jogging (  03/03/16   Status On-going     PT LONG TERM GOAL #2   Title report =/> 75% reduction of pain when she is in bed ( 03/03/16   Status On-going     PT LONG TERM GOAL #3   Title demo Rt hip/knee strength =/> Lt ( 03/03/16)   Status Partially Met     PT LONG TERM GOAL #4   Title report no tenderness with palpation in the rt low back and buttocks ( 03/03/16)   Status On-going     PT LONG TERM GOAL #5   Title improve FOTO =/< 48% limited, CK level ( 03/03/16)   Status On-going     PT LONG TERM GOAL #6   Title demo Rt wrist ROM WNL and painfree ( 03/03/16)    Status On-going     PT LONG TERM GOAL #7   Title demo Rt wrist and elbow =/> 5-/5 without pain ( 03/03/16)    Status On-going     PT LONG TERM GOAL #8   Title report =/> 75% reduction in Rt wrist pain ( 03/03/16)    Status On-going              Patient will benefit from skilled therapeutic intervention in order to improve the following deficits and impairments:     Visit Diagnosis: Chronic right-sided low back pain, with sciatica presence unspecified  Muscle weakness (generalized)  Back stiffness  Pain in right wrist  Stiffness of right wrist, not elsewhere classified     Problem List Patient Active Problem List   Diagnosis Date Noted  . De Quervain's tenosynovitis, right 01/20/2016  . Lumbago 01/18/2016  . Hypercalcemia 12/28/2015  . Ganglion cyst of wrist 10/27/2015  . Sciatica of right side 10/27/2015  . Right knee pain 10/27/2015  . Diabetic retinopathy ([place]) 03/23/2015  . GERD (gastroesophageal reflux disease) 01/05/2015  . Osteopenia 03/03/2014  . Type 2 diabetes mellitus with microalbuminuria or microproteinuria 03/03/2014  . Hypothyroid 02/28/2014  . Anxiety and depression 02/28/2014    Jeral Pinch PT 02/11/2016, [place], [place], [address] Phone: [phone]   Fax:  [phone]  Name: Consetta Cosner MRN: [id number] Date of Birth: 1941/01/14

## 2016-02-16 ENCOUNTER — Encounter: Payer: Self-pay | Admitting: Physical Therapy

## 2016-02-17 ENCOUNTER — Ambulatory Visit (INDEPENDENT_AMBULATORY_CARE_PROVIDER_SITE_OTHER): Payer: Medicare PPO | Admitting: Physical Therapy

## 2016-02-17 ENCOUNTER — Encounter: Payer: Self-pay | Admitting: Physical Therapy

## 2016-02-17 DIAGNOSIS — M25531 Pain in right wrist: Secondary | ICD-10-CM | POA: Diagnosis not present

## 2016-02-17 DIAGNOSIS — M545 Low back pain: Secondary | ICD-10-CM | POA: Diagnosis not present

## 2016-02-17 DIAGNOSIS — M25631 Stiffness of right wrist, not elsewhere classified: Secondary | ICD-10-CM

## 2016-02-17 DIAGNOSIS — M6281 Muscle weakness (generalized): Secondary | ICD-10-CM

## 2016-02-17 DIAGNOSIS — M2560 Stiffness of unspecified joint, not elsewhere classified: Secondary | ICD-10-CM

## 2016-02-17 DIAGNOSIS — G8929 Other chronic pain: Secondary | ICD-10-CM

## 2016-02-17 DIAGNOSIS — M2569 Stiffness of other specified joint, not elsewhere classified: Secondary | ICD-10-CM

## 2016-02-17 NOTE — Therapy (Signed)
[facility] [place], [place], [address] Phone: [phone]   Fax:  [phone]  Physical Therapy Treatment  Patient Details  Name: Veronica [name] MRN: [id number] Date of Birth: 03-Apr-1940 Referring Provider: Dr Georgina Snell  Encounter Date: 02/17/2016      PT End of Session - 02/17/16 1024    Visit Number 7   Number of Visits 12   Date for PT Re-Evaluation 03/03/16   PT Start Time 1022   PT Stop Time 1106   PT Time Calculation (min) 44 min      Past Medical History:  Diagnosis Date  . Anemia   . Diabetic retinopathy ([place]) 03/23/2015  . GERD (gastroesophageal reflux disease) 01/05/2015  . Hypothyroid   . Osteopenia 03/03/2014  . Type 2 diabetes mellitus with microalbuminuria or microproteinuria 03/03/2014   Actos added January 2016.      Past Surgical History:  Procedure Laterality Date  . APPENDECTOMY  02/28/11  . CESAREAN SECTION    . LAPAROSCOPIC APPENDECTOMY  03/03/2011   Procedure: APPENDECTOMY LAPAROSCOPIC;  Surgeon: Pedro Earls, MD;  Location: [facility];  Service: General;  Laterality: N/A;  . TONSILLECTOMY      There were no vitals filed for this visit.      Subjective Assessment - 02/17/16 1023    Subjective Pain in the wrist is now intermittent, no longer constant. Wakes up with soreness in her low back.    Patient Stated Goals exercise and work out again without pain. Swim, [employer] use scissors, stop dropping items, play flute and piano again   Currently in Pain? Yes   Pain Location --  no wrist pain this AM   Pain Score 4   Pain Location Back   Pain Orientation Right   Pain Descriptors / Indicators Aching   Pain Type Chronic pain   Pain Onset More than a month ago   Pain Frequency Intermittent   Aggravating Factors  first thing in AM   Pain Relieving Factors heat and stretches                         [facility] Adult PT Treatment/Exercise - 02/17/16 0001      Self-Care   Self-Care Other Self-Care Comments   Other Self-Care Comments  self trigger point release to Rt buttocks/piriformis     Exercises   Exercises Lumbar;Hand     Lumbar Exercises: Aerobic   Stationary Bike nustep L4 x 5 min   upper and lower extremity     Lumbar Exercises: Seated   Other Seated Lumbar Exercises Rt Le nerve root stretching in seated position     Lumbar Exercises: Supine   Dead Bug 20 reps  modified, with straight leg and arm   Bridge Compliant;20 reps  figure 4 each side.      Hand Exercises   Other Hand Exercises 20 reps radial deviation, 20 reps supination/pronation wit 1#, ext with 1# ,    Other Hand Exercises finger ext with rubber band x 10      Modalities   Modalities Ultrasound;Iontophoresis     Moist Heat Therapy   Number Minutes Moist Heat --  pt to do at home if needed   Moist Heat Location --     Cryotherapy   Number Minutes Cryotherapy --   Cryotherapy Location --   Type of Cryotherapy --  pt to do at home     Acupuncturist  Stimulation Location --   Electrical engineer Parameters --   Electrical Stimulation Goals --     Ultrasound   Ultrasound Location base of Rt thumb   Ultrasound Parameters 50% 3.69mz, 1.0w/cm2   Ultrasound Goals Pain     Iontophoresis   Type of Iontophoresis Dexamethasone   Location base of Rt thumb   Dose 1.3cc   Time 13 hr spider patch                     PT Long Term Goals - 02/17/16 1025      PT LONG TERM GOAL #1   Title I with advanced HEP to include resuming weight training and light jogging ( 03/03/16   Status On-going     PT LONG TERM GOAL #2   Title report =/> 75% reduction of pain when she is in bed ( 03/03/16   Status On-going     PT LONG TERM GOAL #3   Title demo Rt hip/knee strength =/> Lt ( 03/03/16)   Status Partially Met     PT LONG TERM GOAL #4   Title report no tenderness with palpation in the rt low  back and buttocks ( 03/03/16)   Status On-going     PT LONG TERM GOAL #5   Title improve FOTO =/< 48% limited, CK level ( 03/03/16)   Status On-going     PT LONG TERM GOAL #6   Title demo Rt wrist ROM WNL and painfree ( 03/03/16)    Status On-going     PT LONG TERM GOAL #7   Title demo Rt wrist and elbow =/> 5-/5 without pain ( 03/03/16)    Status On-going     PT LONG TERM GOAL #8   Title report =/> 75% reduction in Rt wrist pain ( 03/03/16)    Status On-going               Plan - 02/17/16 1123    Clinical Impression Statement [name] responded well to self trigger point release to the Rt buttocks with a ball, she continues to have some nerve like pain in the rt buttock and posterior thigh, Her wrist pain is slowly improving,however it is irritated with resisted ex.  No goals met, making progress to them.    Rehab Potential Good   PT Frequency 2x / week   PT Duration 4 weeks   PT Treatment/Interventions Moist Heat;Ultrasound;Therapeutic exercise;Dry needling;Taping;Manual techniques;Neuromuscular re-education;Cryotherapy;Electrical Stimulation;Functional mobility training;Patient/family education;Iontophoresis 430mml Dexamethasone;Vasopneumatic Device   PT Next Visit Plan continue with ionto & US,TDN to buttocks as indicated. See if visits have been approved.       Patient will benefit from skilled therapeutic intervention in order to improve the following deficits and impairments:  Postural dysfunction, Decreased strength, Pain, Hypomobility, Increased muscle spasms, Decreased range of motion  Visit Diagnosis: Chronic right-sided low back pain, with sciatica presence unspecified  Muscle weakness (generalized)  Back stiffness  Pain in right wrist  Stiffness of right wrist, not elsewhere classified     Problem List Patient Active Problem List   Diagnosis Date Noted  . De Quervain's tenosynovitis, right 01/20/2016  . Lumbago 01/18/2016  . Hypercalcemia 12/28/2015  .  Ganglion cyst of wrist 10/27/2015  . Sciatica of right side 10/27/2015  . Right knee pain 10/27/2015  . [place]  . GERD (gastroesophageal reflux disease) 01/05/2015  . Osteopenia 03/03/2014  . Type 2 diabetes mellitus with microalbuminuria or  microproteinuria 03/03/2014  . Hypothyroid 02/28/2014  . Anxiety and depression 02/28/2014    Jeral Pinch PT  02/17/2016, 11:26 AM  [facility] [place], [place], [address] Phone: [phone]   Fax:  [phone]  Name: Lacosta Hargan MRN: [id number] Date of Birth: August 31, 1940

## 2016-02-18 ENCOUNTER — Encounter: Payer: Self-pay | Admitting: Physical Therapy

## 2016-02-18 ENCOUNTER — Ambulatory Visit (INDEPENDENT_AMBULATORY_CARE_PROVIDER_SITE_OTHER): Payer: Medicare PPO | Admitting: Physical Therapy

## 2016-02-18 DIAGNOSIS — G8929 Other chronic pain: Secondary | ICD-10-CM

## 2016-02-18 DIAGNOSIS — M2560 Stiffness of unspecified joint, not elsewhere classified: Secondary | ICD-10-CM | POA: Diagnosis not present

## 2016-02-18 DIAGNOSIS — M6281 Muscle weakness (generalized): Secondary | ICD-10-CM

## 2016-02-18 DIAGNOSIS — M25531 Pain in right wrist: Secondary | ICD-10-CM | POA: Diagnosis not present

## 2016-02-18 DIAGNOSIS — M2569 Stiffness of other specified joint, not elsewhere classified: Secondary | ICD-10-CM

## 2016-02-18 DIAGNOSIS — M545 Low back pain: Secondary | ICD-10-CM

## 2016-02-18 DIAGNOSIS — M25631 Stiffness of right wrist, not elsewhere classified: Secondary | ICD-10-CM

## 2016-02-18 NOTE — Therapy (Signed)
[facility]  [place], [place], [address] Phone: [phone]   Fax:  [phone]  Physical Therapy Treatment  Patient Details  Name: Veronica [name] MRN: [id number] Date of Birth: 1941/01/31 Referring Provider: Dr Georgina Snell  Encounter Date: 02/18/2016      PT End of Session - 02/18/16 1449    Visit Number 8   Number of Visits 12   Date for PT Re-Evaluation 03/03/16   PT Start Time 7543   PT Stop Time 1546   PT Time Calculation (min) 57 min   Activity Tolerance Patient tolerated treatment well      Past Medical History:  Diagnosis Date  . Anemia   . Diabetic retinopathy ([place]) 03/23/2015  . GERD (gastroesophageal reflux disease) 01/05/2015  . Hypothyroid   . Osteopenia 03/03/2014  . Type 2 diabetes mellitus with microalbuminuria or microproteinuria 03/03/2014   Actos added January 2016.      Past Surgical History:  Procedure Laterality Date  . APPENDECTOMY  02/28/11  . CESAREAN SECTION    . LAPAROSCOPIC APPENDECTOMY  03/03/2011   Procedure: APPENDECTOMY LAPAROSCOPIC;  Surgeon: Pedro Earls, MD;  Location: [facility];  Service: General;  Laterality: N/A;  . TONSILLECTOMY      There were no vitals filed for this visit.      Subjective Assessment - 02/18/16 1451    Subjective Pt reports her back is about the same, the wrist is still having sharp pain at times.    Currently in Pain? Yes   Pain Score 3    Pain Location Back   Pain Orientation Right   Pain Score 6   Pain Location Wrist   Pain Orientation Right                         [facility] Adult PT Treatment/Exercise - 02/18/16 0001      Modalities   Modalities Electrical Stimulation;Ultrasound;Moist Heat     Moist Heat Therapy   Number Minutes Moist Heat 15 Minutes   Moist Heat Location Lumbar Spine     Cryotherapy   Number Minutes Cryotherapy 15 Minutes   Cryotherapy Location Wrist   Type of Cryotherapy Ice pack     Electrical Stimulation   Electrical Stimulation Location Rt lumbar and gluts   Electrical Stimulation Action IFC   Electrical Stimulation Parameters to tolerance   Electrical Stimulation Goals Pain;Tone     Ultrasound   Ultrasound Location base of Rt thumb   Ultrasound Parameters 50%, 3.59mz, 1.0 w/cm2   Ultrasound Goals Pain     Iontophoresis   Type of Iontophoresis Dexamethasone   Location base of Rt thumb   Dose 1.3cc   Time 13 hr spider patch     Manual Therapy   Manual Therapy Soft tissue mobilization   Soft tissue mobilization Rt gluts, QL and piriformis, cross friction massage to Rt thenar emmenance and base of thumb, gently grade II mobs distal radius and base of first carpal          Trigger Point Dry Needling - 02/18/16 1528    Consent Given? Yes   Education Handout Provided No   Muscles Treated Upper Body Longissimus;Gluteus minimus;Gluteus maximus   Longissimus Response Palpable increased muscle length;Twitch response elicited  Rt lumbar   Gluteus Maximus Response Palpable increased muscle length;Twitch response elicited   Gluteus Minimus Response Palpable increased muscle length;Twitch response elicited  PT Long Term Goals - 02/17/16 1025      PT LONG TERM GOAL #1   Title I with advanced HEP to include resuming weight training and light jogging ( 03/03/16   Status On-going     PT LONG TERM GOAL #2   Title report =/> 75% reduction of pain when she is in bed ( 03/03/16   Status On-going     PT LONG TERM GOAL #3   Title demo Rt hip/knee strength =/> Lt ( 03/03/16)   Status Partially Met     PT LONG TERM GOAL #4   Title report no tenderness with palpation in the rt low back and buttocks ( 03/03/16)   Status On-going     PT LONG TERM GOAL #5   Title improve FOTO =/< 48% limited, CK level ( 03/03/16)   Status On-going     PT LONG TERM GOAL #6   Title demo Rt wrist ROM WNL and painfree ( 03/03/16)    Status On-going     PT LONG  TERM GOAL #7   Title demo Rt wrist and elbow =/> 5-/5 without pain ( 03/03/16)    Status On-going     PT LONG TERM GOAL #8   Title report =/> 75% reduction in Rt wrist pain ( 03/03/16)    Status On-going               Plan - 02/18/16 1529    Clinical Impression Statement [name] tolerated tx well today, did have some areas of increased tightness n her Rt lumbar paraspinals and gluts.     Rehab Potential Good   PT Frequency 2x / week   PT Duration 4 weeks   PT Treatment/Interventions Moist Heat;Ultrasound;Therapeutic exercise;Dry needling;Taping;Manual techniques;Neuromuscular re-education;Cryotherapy;Electrical Stimulation;Functional mobility training;Patient/family education;Iontophoresis 71m/ml Dexamethasone;Vasopneumatic Device   PT Next Visit Plan continue with ionto & US,TDN to buttocks as indicated.    Consulted and Agree with Plan of Care Patient      Patient will benefit from skilled therapeutic intervention in order to improve the following deficits and impairments:  Postural dysfunction, Decreased strength, Pain, Hypomobility, Increased muscle spasms, Decreased range of motion  Visit Diagnosis: Chronic right-sided low back pain, with sciatica presence unspecified  Muscle weakness (generalized)  Back stiffness  Pain in right wrist  Stiffness of right wrist, not elsewhere classified     Problem List Patient Active Problem List   Diagnosis Date Noted  . De Quervain's tenosynovitis, right 01/20/2016  . Lumbago 01/18/2016  . Hypercalcemia 12/28/2015  . Ganglion cyst of wrist 10/27/2015  . Sciatica of right side 10/27/2015  . Right knee pain 10/27/2015  . [place] 03/23[place]  . GERD (gastroesophageal reflux disease) 01/05/2015  . Osteopenia 03/03/2014  . Type 2 diabetes mellitus with microalbuminuria or microproteinuria 03/03/2014  . Hypothyroid 02/28/2014  . Anxiety and depression 02/28/2014    SJeral PinchPT  02/18/2016, 3:30 PM  [facility] [place] [address]Phone: [phone]  Fax:  [phone] Name: PSwan FairfaxMRN: [id number] of Birth: [birth year]

## 2016-02-24 ENCOUNTER — Ambulatory Visit (INDEPENDENT_AMBULATORY_CARE_PROVIDER_SITE_OTHER): Payer: Medicare PPO | Admitting: Physical Therapy

## 2016-02-24 DIAGNOSIS — M545 Low back pain: Secondary | ICD-10-CM | POA: Diagnosis not present

## 2016-02-24 DIAGNOSIS — M2560 Stiffness of unspecified joint, not elsewhere classified: Secondary | ICD-10-CM

## 2016-02-24 DIAGNOSIS — M25631 Stiffness of right wrist, not elsewhere classified: Secondary | ICD-10-CM

## 2016-02-24 DIAGNOSIS — M2569 Stiffness of other specified joint, not elsewhere classified: Secondary | ICD-10-CM

## 2016-02-24 DIAGNOSIS — M6281 Muscle weakness (generalized): Secondary | ICD-10-CM

## 2016-02-24 DIAGNOSIS — M25531 Pain in right wrist: Secondary | ICD-10-CM

## 2016-02-24 DIAGNOSIS — G8929 Other chronic pain: Secondary | ICD-10-CM

## 2016-02-24 NOTE — Therapy (Signed)
[facility] [place], [place], [address] Phone: [phone]   Fax:  [phone]  Physical Therapy Treatment  Patient Details  Name: Veronica [name] MRN: [id number] Date of Birth: [birth year] Referring Provider: Dr Georgina Snell  Encounter Date: 02/24/2016      PT End of Session - 02/24/16 1401    Visit Number 9   Number of Visits 12   Date for PT Re-Evaluation 03/03/16   PT Start Time 1401   PT Stop Time 1444   PT Time Calculation (min) 43 min   Activity Tolerance Patient tolerated treatment well      Past Medical History:  Diagnosis Date  . Anemia   . Diabetic retinopathy ([place]) 03/23/2015  . GERD (gastroesophageal reflux disease) 01/05/2015  . Hypothyroid   . Osteopenia 03/03/2014  . Type 2 diabetes mellitus with microalbuminuria or microproteinuria 03/03/2014   Actos added January 2016.      Past Surgical History:  Procedure Laterality Date  . APPENDECTOMY  02/28/11  . CESAREAN SECTION    . LAPAROSCOPIC APPENDECTOMY  03/03/2011   Procedure: APPENDECTOMY LAPAROSCOPIC;  Surgeon: Pedro Earls, MD;  Location: [facility];  Service: General;  Laterality: N/A;  . TONSILLECTOMY      There were no vitals filed for this visit.      Subjective Assessment - 02/24/16 1404    Subjective [name] reports her leg will feel better than hurt again. She says it feels shorter.  The wrist is feeling worse than the last treatment.    Patient Stated Goals exercise and work out again without pain. Swim, [employer] use scissors, stop dropping items, play flute and piano again   Currently in Pain? Yes   Pain Score 2    Pain Location Back   Pain Orientation Lower   Pain Descriptors / Indicators Tightness   Pain Type Acute pain   Pain Relieving Factors heat and stretches   Pain Score 7  6/10 this AM   Pain Location Wrist   Pain Orientation Right   Pain Descriptors / Indicators Aching   Pain Type Chronic pain   Pain Onset More than a  month ago   Pain Frequency Intermittent   Aggravating Factors  with use and the stretches   Pain Relieving Factors rest and ice            [facility] PT Assessment - 02/24/16 0001      Assessment   Medical Diagnosis LBP, Rt sided sciatica, Rt DeQuerveins   Referring Provider Dr Georgina Snell   Onset Date/Surgical Date 07/21/15     Posture/Postural Control   Posture Comments Lt LE was longer in supine, better after muscle energy.      AROM   AROM Assessment Site Wrist   Right/Left Wrist Right   Right Wrist Extension 20 Degrees   Right Wrist Flexion 70 Degrees   Right Wrist Radial Deviation 1 Degrees     PROM   Right Wrist Extension 64 Degrees   Right Wrist Flexion 85 Degrees   Right Wrist Radial Deviation 20 Degrees                     [facility] Adult PT Treatment/Exercise - 02/24/16 0001      Lumbar Exercises: Aerobic   Stationary Bike nustep L4 x 5 min   arms and legs     Lumbar Exercises: Supine   Ab Set 10 reps  10 sec   AB Set Limitations after muscle energy  technique   Bridge Compliant;10 reps  10 sec holds     Modalities   Modalities Electrical Stimulation;Iontophoresis;Moist Heat     Moist Heat Therapy   Number Minutes Moist Heat 20 Minutes   Moist Heat Location Lumbar Spine     Electrical Stimulation   Electrical Stimulation Location lumbar   Electrical Stimulation Action IFC   Electrical Stimulation Parameters to tolerance   Electrical Stimulation Goals Pain;Tone     Ultrasound   Ultrasound Location base of Rt thumb   Ultrasound Parameters 20%, 3.50mz, 0.8 w/cm2   Ultrasound Goals Pain     Iontophoresis   Type of Iontophoresis Dexamethasone   Location base of Rt thumb   Dose 1.3cc   Time 13 hr spider patch     Manual Therapy   Manual Therapy Muscle Energy Technique   Muscle Energy Technique to rotate  rt ilum anterior to improve alignment                     PT Long Term Goals - 02/17/16 1025      PT LONG TERM GOAL #1    Title I with advanced HEP to include resuming weight training and light jogging ( 03/03/16   Status On-going     PT LONG TERM GOAL #2   Title report =/> 75% reduction of pain when she is in bed ( 03/03/16   Status On-going     PT LONG TERM GOAL #3   Title demo Rt hip/knee strength =/> Lt ( 03/03/16)   Status Partially Met     PT LONG TERM GOAL #4   Title report no tenderness with palpation in the rt low back and buttocks ( 03/03/16)   Status On-going     PT LONG TERM GOAL #5   Title improve FOTO =/< 48% limited, CK level ( 03/03/16)   Status On-going     PT LONG TERM GOAL #6   Title demo Rt wrist ROM WNL and painfree ( 03/03/16)    Status On-going     PT LONG TERM GOAL #7   Title demo Rt wrist and elbow =/> 5-/5 without pain ( 03/03/16)    Status On-going     PT LONG TERM GOAL #8   Title report =/> 75% reduction in Rt wrist pain ( 03/03/16)    Status On-going               Plan - 02/24/16 1440    Clinical Impression Statement Veronica Frohlichhas decreased edema in her Rt wrist however her pain conitnues and she has less motion than when previously measured.  She was out of alignment in her pelvis, this was corrected with muscle energy techniques and she has decreased symptoms after.  Slow progress to goals over this last week.  She returns to MD next week.    Rehab Potential Good   PT Frequency 2x / week   PT Duration 4 weeks   PT Treatment/Interventions Moist Heat;Ultrasound;Therapeutic exercise;Dry needling;Taping;Manual techniques;Neuromuscular re-education;Cryotherapy;Electrical Stimulation;Functional mobility training;Patient/family education;Iontophoresis 484mml Dexamethasone;Vasopneumatic Device   PT Next Visit Plan recheck alignment, G code      Patient will benefit from skilled therapeutic intervention in order to improve the following deficits and impairments:  Postural dysfunction, Decreased strength, Pain, Hypomobility, Increased muscle spasms, Decreased range of  motion  Visit Diagnosis: Chronic right-sided low back pain, with sciatica presence unspecified  Muscle weakness (generalized)  Back stiffness  Pain in right wrist  Stiffness of right wrist, not  elsewhere classified     Problem List Patient Active Problem List   Diagnosis Date Noted  . De Quervain's tenosynovitis, right 01/20/2016  . Lumbago 01/18/2016  . Hypercalcemia 12/28/2015  . Ganglion cyst of wrist 10/27/2015  . Sciatica of right side 10/27/2015  . Right knee pain 10/27/2015  . Diabetic retinopathy ([place]) 03/23/2015  . GERD (gastroesophageal reflux disease) 01/05/2015  . Osteopenia 03/03/2014  . Type 2 diabetes mellitus with microalbuminuria or microproteinuria 03/03/2014  . Hypothyroid 02/28/2014  . Anxiety and depression 02/28/2014    Jeral Pinch PT  02/24/2016, 2:42 PM  [facility] [place], [place], [address] Phone: [phone]   Fax:  [phone]  Name: Veronica [name] MRN: [id number] Date of Birth: 08/29/1940

## 2016-02-26 ENCOUNTER — Ambulatory Visit (INDEPENDENT_AMBULATORY_CARE_PROVIDER_SITE_OTHER): Payer: Medicare PPO | Admitting: Rehabilitative and Restorative Service Providers"

## 2016-02-26 ENCOUNTER — Encounter: Payer: Self-pay | Admitting: Rehabilitative and Restorative Service Providers"

## 2016-02-26 DIAGNOSIS — M2569 Stiffness of other specified joint, not elsewhere classified: Secondary | ICD-10-CM

## 2016-02-26 DIAGNOSIS — M25631 Stiffness of right wrist, not elsewhere classified: Secondary | ICD-10-CM

## 2016-02-26 DIAGNOSIS — M545 Low back pain: Secondary | ICD-10-CM

## 2016-02-26 DIAGNOSIS — G8929 Other chronic pain: Secondary | ICD-10-CM

## 2016-02-26 DIAGNOSIS — M25531 Pain in right wrist: Secondary | ICD-10-CM

## 2016-02-26 DIAGNOSIS — M2560 Stiffness of unspecified joint, not elsewhere classified: Secondary | ICD-10-CM

## 2016-02-26 DIAGNOSIS — M6281 Muscle weakness (generalized): Secondary | ICD-10-CM | POA: Diagnosis not present

## 2016-02-26 NOTE — Therapy (Signed)
[facility] [place] [address] [place], [place], [address] Phone: [phone]   Fax:  [phone]  Physical Therapy Treatment  Patient Details  Name: Veronica [name] MRN: [id number] Date of Birth: 02-Jul-1940 Referring Provider: Dr Denyse Amass  Encounter Date: 02/26/2016      PT End of Session - 02/26/16 1329    Visit Number 10   Number of Visits 12   Date for PT Re-Evaluation 03/03/16   PT Start Time 1330   PT Stop Time 1419   PT Time Calculation (min) 49 min   Activity Tolerance Patient tolerated treatment well      Past Medical History:  Diagnosis Date  . Anemia   . Diabetic retinopathy (HCC) 03/23/2015  . GERD (gastroesophageal reflux disease) 01/05/2015  . Hypothyroid   . Osteopenia 03/03/2014  . Type 2 diabetes mellitus with microalbuminuria or microproteinuria 03/03/2014   Actos added January 2016.      Past Surgical History:  Procedure Laterality Date  . APPENDECTOMY  02/28/11  . CESAREAN SECTION    . LAPAROSCOPIC APPENDECTOMY  03/03/2011   Procedure: APPENDECTOMY LAPAROSCOPIC;  Surgeon: Valarie Merino, MD;  Location: [facility];  Service: General;  Laterality: N/A;  . TONSILLECTOMY      There were no vitals filed for this visit.      Subjective Assessment - 02/26/16 1331    Subjective [name] reports that she continues to have LBP and now more pain in the groin and medial knee. Would like to try the dry needling again today. Wrist is still bad.    Currently in Pain? Yes   Pain Score 3    Pain Location Back   Pain Descriptors / Indicators Tightness   Pain Type Acute pain   Pain Onset More than a month ago   Pain Frequency Constant   Pain Score 5   Pain Location Wrist   Pain Orientation Right   Pain Descriptors / Indicators Aching   Pain Type Chronic pain   Pain Onset More than a month ago   Pain Frequency Intermittent                         [facility] Adult PT Treatment/Exercise - 02/26/16 0001       Lumbar Exercises: Supine   Other Supine Lumbar Exercises hip adductor stretch supine with strap 30 sec x 3      Moist Heat Therapy   Number Minutes Moist Heat 15 Minutes   Moist Heat Location Lumbar Spine     Electrical Stimulation   Electrical Stimulation Location lumbar   Electrical Stimulation Action IFC   Electrical Stimulation Parameters to tolerance   Electrical Stimulation Goals Pain;Tone     Ultrasound   Ultrasound Location base of Lt thumb into Lt wrist area    Ultrasound Parameters 50%; 3.3 mHz;  1.0 w/cm26 min    Ultrasound Goals Pain     Iontophoresis   Type of Iontophoresis Dexamethasone   Location base of Rt thumb   Dose 1109m/Amp 1.0 cc   Time 8 hours      Manual Therapy   Soft tissue mobilization Rt hip adductors hip to knee; Rt wrist           Trigger Point Dry Needling - 02/26/16 1403    Consent Given? Yes   Muscles Treated Lower Body --  hip adductors Rt twitch/decreased tightness  PT Long Term Goals - 02/26/16 1614      PT LONG TERM GOAL #1   Title I with advanced HEP to include resuming weight training and light jogging ( 03/03/16   Time 4   Period Weeks   Status On-going     PT LONG TERM GOAL #2   Title report =/> 75% reduction of pain when she is in bed ( 03/03/16   Time 4   Period Weeks   Status On-going     PT LONG TERM GOAL #3   Title demo Rt hip/knee strength =/> Lt ( 03/03/16)   Time 4   Period Weeks   Status On-going     PT LONG TERM GOAL #4   Title report no tenderness with palpation in the rt low back and buttocks ( 03/03/16)   Time 4   Period Weeks   Status On-going     PT LONG TERM GOAL #5   Title improve FOTO =/< 48% limited, CK level ( 03/03/16)   Time 4   Period Weeks   Status On-going     PT LONG TERM GOAL #6   Title demo Rt wrist ROM WNL and painfree ( 03/03/16)    Time 4   Period Weeks   Status On-going     PT LONG TERM GOAL #7   Title demo Rt wrist and elbow =/> 5-/5 without  pain ( 03/03/16)    Time 4   Period Weeks   Status On-going     PT LONG TERM GOAL #8   Title report =/> 75% reduction in Rt wrist pain ( 03/03/16)    Time 4   Period Weeks   Status On-going               Plan - 02/26/16 1610    Clinical Impression Statement Continued pain in the Rt wrist with no significant change in function. Wears the brace most of the time. Feels the ionto does help. Reports incresed LB and Rt groin pain as well as pain into the medial knee which is worse with walking. Good response to TDN with stim to Rt adductors as well as adductor stretch. Returns to MD next week.    Rehab Potential Good   PT Frequency 2x / week   PT Duration 6 weeks   PT Treatment/Interventions Moist Heat;Ultrasound;Therapeutic exercise;Dry needling;Taping;Manual techniques;Neuromuscular re-education;Cryotherapy;Electrical Stimulation;Functional mobility training;Patient/family education;Iontophoresis 4mg /ml Dexamethasone;Vasopneumatic Device   PT Next Visit Plan continue treatment to address Rt wrist pain and decreased function; Rt LB/groin/medial knee pain; cont assessment of pelvic alignment; TDN; manual work; modalities    Consulted and Agree with Plan of Care Patient      Patient will benefit from skilled therapeutic intervention in order to improve the following deficits and impairments:  Postural dysfunction, Decreased strength, Pain, Hypomobility, Increased muscle spasms, Decreased range of motion  Visit Diagnosis: Chronic right-sided low back pain, with sciatica presence unspecified  Muscle weakness (generalized)  Back stiffness  Pain in right wrist  Stiffness of right wrist, not elsewhere classified     Problem List Patient Active Problem List   Diagnosis Date Noted  . De Quervain's tenosynovitis, right 01/20/2016  . Lumbago 01/18/2016  . Hypercalcemia 12/28/2015  . Ganglion cyst of wrist 10/27/2015  . Sciatica of right side 10/27/2015  . Right knee pain  10/27/2015  . Diabetic retinopathy (HCC) 03/23/2015  . GERD (gastroesophageal reflux disease) 01/05/2015  . Osteopenia 03/03/2014  . Type 2 diabetes mellitus with microalbuminuria or microproteinuria  03/03/2014  . Hypothyroid 02/28/2014  . Anxiety and depression 02/28/2014    Orman Matsumura Rober Minion PT, MPH  02/26/2016, 4:17 PM  [facility] [address] [place], [place], [address] Phone: [phone]   Fax:  [phone]  Name: Veronica [name] MRN: [id number] Date of Birth: 19-Mar-1940

## 2016-02-26 NOTE — Patient Instructions (Signed)

## 2016-03-02 ENCOUNTER — Encounter: Payer: Self-pay | Admitting: Physical Therapy

## 2016-03-02 ENCOUNTER — Ambulatory Visit (INDEPENDENT_AMBULATORY_CARE_PROVIDER_SITE_OTHER): Payer: Medicare PPO | Admitting: Physical Therapy

## 2016-03-02 DIAGNOSIS — M6281 Muscle weakness (generalized): Secondary | ICD-10-CM

## 2016-03-02 DIAGNOSIS — M25631 Stiffness of right wrist, not elsewhere classified: Secondary | ICD-10-CM

## 2016-03-02 DIAGNOSIS — M2569 Stiffness of other specified joint, not elsewhere classified: Secondary | ICD-10-CM

## 2016-03-02 DIAGNOSIS — M545 Low back pain: Secondary | ICD-10-CM

## 2016-03-02 DIAGNOSIS — M25531 Pain in right wrist: Secondary | ICD-10-CM | POA: Diagnosis not present

## 2016-03-02 DIAGNOSIS — M2560 Stiffness of unspecified joint, not elsewhere classified: Secondary | ICD-10-CM | POA: Diagnosis not present

## 2016-03-02 DIAGNOSIS — G8929 Other chronic pain: Secondary | ICD-10-CM

## 2016-03-02 NOTE — Therapy (Signed)
[facility] [place] [address] [place], [place], [address] Phone: [phone]   Fax:  [phone]  Physical Therapy Treatment  Patient Details  Name: Veronica [name] MRN: [id number] Date of Birth: 04/29/1940 Referring Provider: Dr Denyse Amassorey  Encounter Date: 03/02/2016      PT End of Session - 03/02/16 1434    Visit Number 11   Number of Visits 19   Date for PT Re-Evaluation 03/30/16   PT Start Time 1434   PT Stop Time 1537   PT Time Calculation (min) 63 min   Activity Tolerance Patient tolerated treatment well      Past Medical History:  Diagnosis Date  . Anemia   . Diabetic retinopathy (HCC) 03/23/2015  . GERD (gastroesophageal reflux disease) 01/05/2015  . Hypothyroid   . Osteopenia 03/03/2014  . Type 2 diabetes mellitus with microalbuminuria or microproteinuria 03/03/2014   Actos added January 2016.      Past Surgical History:  Procedure Laterality Date  . APPENDECTOMY  02/28/11  . CESAREAN SECTION    . LAPAROSCOPIC APPENDECTOMY  03/03/2011   Procedure: APPENDECTOMY LAPAROSCOPIC;  Surgeon: Valarie MerinoMatthew B Martin, MD;  Location: [facility];  Service: General;  Laterality: N/A;  . TONSILLECTOMY      There were no vitals filed for this visit.      Subjective Assessment - 03/02/16 1436    Subjective [name] reports that last week she did a 30' walk and felt good and then she developed groin pain Rt and into the hamstrings after the needling she feels better.  Also feels like the wrist is a little better.    Pain Score 2    Pain Location Back   Pain Orientation Lower   Pain Descriptors / Indicators Dull   Pain Type Acute pain   Pain Score 2  7/10 with bending   Pain Location Wrist   Pain Orientation Right   Pain Descriptors / Indicators Aching   Pain Type Chronic pain   Pain Frequency Intermittent                         [facility] Adult PT Treatment/Exercise - 03/02/16 0001      Lumbar Exercises: Stretches   Lower Trunk Rotation 5 reps     Lumbar Exercises: Aerobic   Stationary Bike nustep L4 x 5 min      Hand Exercises   Other Hand Exercises wrist flexion and radial deviation stretches.      Modalities   Modalities Electrical Stimulation;Moist Heat     Moist Heat Therapy   Number Minutes Moist Heat 20 Minutes   Moist Heat Location Lumbar Spine  Rt thigh anterior and posterior, Rt forearm     Electrical Stimulation   Electrical Stimulation Location lumbar to thigh Rt   Electrical Stimulation Action premod   Electrical Stimulation Parameters to tolerance    Electrical Stimulation Goals Pain;Tone     Manual Therapy   Manual Therapy Soft tissue mobilization;Taping   Soft tissue mobilization Rt LE adductors, hamstring and lumbar paraspinals  and Rt forearm STW and TPR    Kinesiotex Facilitate Muscle     Kinesiotix   Facilitate Muscle  dynamic tape Rt thumb and forearm to aid radial deviation           Trigger Point Dry Needling - 03/02/16 1512    Consent Given? Yes   Muscles Treated Upper Body Longissimus;Adductor longus/brevius/magnus;Hamstring  Rt all with stim, Rt forearm extensor mass  Muscles Treated Lower Body Adductor longus/brevius/maximus;Hamstring  Rt with stim   Longissimus Response Palpable increased muscle length;Twitch response elicited   Adductor Response Palpable increased muscle length;Twitch response elicited   Hamstring Response Twitch response elicited;Palpable increased muscle length                   PT Long Term Goals - 03/02/16 1641      PT LONG TERM GOAL #1   Title I with advanced HEP to include resuming weight training and light jogging ( 03/30/16)   Time 4   Period Weeks   Status On-going     PT LONG TERM GOAL #2   Title report =/> 75% reduction of pain when she is in bed ( 03/30/16)   Time 4   Period Weeks   Status On-going  slow progress     PT LONG TERM GOAL #3   Title demo Rt hip/knee strength =/> Lt ( 03/30/16)   Time 4    Period Weeks   Status On-going     PT LONG TERM GOAL #4   Title report no tenderness with palpation in the rt low back and buttocks ( 03/30/16)   Time 4   Period Weeks   Status On-going     PT LONG TERM GOAL #5   Title improve FOTO =/< 48% limited, CK level ( 03/30/16)   Time 4   Period Weeks   Status On-going     PT LONG TERM GOAL #6   Title demo Rt wrist ROM WNL and painfree ( 03/30/16)    Time 4   Period Weeks   Status On-going     PT LONG TERM GOAL #7   Title demo Rt wrist and elbow =/> 5-/5 without pain ( 03/30/16)    Time 4   Period Weeks   Status On-going     PT LONG TERM GOAL #8   Title report =/> 75% reduction in Rt wrist pain ( 03/30/16)    Time 4   Period Weeks   Status On-going               Plan - 03/02/16 1638    Clinical Impression Statement Dewayne Hatch reports she thinks she had relief from the DN last visit.  She has noticed the symptoms localizing in the Rt groin and thigh, she also noticed that when she compresses her Rt forearm she has less pain in her thumb.  There are/were multiple trigger points in the forearm so this was treated today as well as the Rt thigh and low back.  She still has some tightness in her lower lumbar L4-5 area and mid hamstring.    Rehab Potential Good   PT Frequency 2x / week   PT Duration 4 weeks   PT Treatment/Interventions Moist Heat;Ultrasound;Therapeutic exercise;Dry needling;Taping;Manual techniques;Neuromuscular re-education;Cryotherapy;Electrical Stimulation;Functional mobility training;Patient/family education;Iontophoresis 4mg /ml Dexamethasone;Vasopneumatic Device   PT Next Visit Plan possible DN to Rt hamstring, and low back as needed, reassess Rt foream.     Consulted and Agree with Plan of Care Patient      Patient will benefit from skilled therapeutic intervention in order to improve the following deficits and impairments:  Postural dysfunction, Decreased strength, Pain, Hypomobility, Increased muscle spasms, Decreased  range of motion  Visit Diagnosis: Chronic right-sided low back pain, with sciatica presence unspecified  Muscle weakness (generalized)  Back stiffness  Pain in right wrist  Stiffness of right wrist, not elsewhere classified     Problem List Patient Active  Problem List   Diagnosis Date Noted  . De Quervain's tenosynovitis, right 01/20/2016  . Lumbago 01/18/2016  . Hypercalcemia 12/28/2015  . Ganglion cyst of wrist 10/27/2015  . Sciatica of right side 10/27/2015  . Right knee pain 10/27/2015  . Diabetic retinopathy (HCC) 03/23/2015  . GERD (gastroesophageal reflux disease) 01/05/2015  . Osteopenia 03/03/2014  . Type 2 diabetes mellitus with microalbuminuria or microproteinuria 03/03/2014  . Hypothyroid 02/28/2014  . Anxiety and depression 02/28/2014    Roderic Scarce PT  03/02/2016, 4:47 PM  [facility] [address] [place], [place], [address] Phone: [phone]   Fax:  [phone]  Name: Nel Stoneking MRN: [id number] Date of Birth: [birth year]

## 2016-03-04 ENCOUNTER — Ambulatory Visit (INDEPENDENT_AMBULATORY_CARE_PROVIDER_SITE_OTHER): Payer: Medicare PPO | Admitting: Rehabilitative and Restorative Service Providers"

## 2016-03-04 ENCOUNTER — Encounter: Payer: Self-pay | Admitting: Rehabilitative and Restorative Service Providers"

## 2016-03-04 ENCOUNTER — Encounter: Payer: Self-pay | Admitting: Family Medicine

## 2016-03-04 ENCOUNTER — Ambulatory Visit (INDEPENDENT_AMBULATORY_CARE_PROVIDER_SITE_OTHER): Payer: Medicare PPO | Admitting: Family Medicine

## 2016-03-04 VITALS — BP 147/84 | HR 79 | Wt 136.0 lb

## 2016-03-04 DIAGNOSIS — M25531 Pain in right wrist: Secondary | ICD-10-CM

## 2016-03-04 DIAGNOSIS — M2560 Stiffness of unspecified joint, not elsewhere classified: Secondary | ICD-10-CM

## 2016-03-04 DIAGNOSIS — G8929 Other chronic pain: Secondary | ICD-10-CM

## 2016-03-04 DIAGNOSIS — M545 Low back pain: Secondary | ICD-10-CM | POA: Diagnosis not present

## 2016-03-04 DIAGNOSIS — M6281 Muscle weakness (generalized): Secondary | ICD-10-CM

## 2016-03-04 DIAGNOSIS — M654 Radial styloid tenosynovitis [de Quervain]: Secondary | ICD-10-CM | POA: Diagnosis not present

## 2016-03-04 DIAGNOSIS — M25631 Stiffness of right wrist, not elsewhere classified: Secondary | ICD-10-CM

## 2016-03-04 DIAGNOSIS — M2569 Stiffness of other specified joint, not elsewhere classified: Secondary | ICD-10-CM

## 2016-03-04 DIAGNOSIS — M5441 Lumbago with sciatica, right side: Secondary | ICD-10-CM | POA: Diagnosis not present

## 2016-03-04 NOTE — Therapy (Addendum)
[facility] [place] [address] [place] [address] [place], [place], [address] Phone: [phone]   Fax:  [phone]  Physical Therapy Treatment  Patient Details  Name: Veronica [name] MRN: [id number] Date of Birth: Aug 26, 1940 Referring Provider: Dr Denyse Amass  Encounter Date: 03/04/2016      PT End of Session - 03/04/16 1022    Visit Number 12   Number of Visits 19   Date for PT Re-Evaluation 03/30/16   PT Start Time 1014   PT Stop Time 1113   PT Time Calculation (min) 59 min   Activity Tolerance Patient tolerated treatment well      Past Medical History:  Diagnosis Date  . Anemia   . Diabetic retinopathy (HCC) 03/23/2015  . GERD (gastroesophageal reflux disease) 01/05/2015  . Hypothyroid   . Osteopenia 03/03/2014  . Type 2 diabetes mellitus with microalbuminuria or microproteinuria 03/03/2014   Actos added January 2016.      Past Surgical History:  Procedure Laterality Date  . APPENDECTOMY  02/28/11  . CESAREAN SECTION    . LAPAROSCOPIC APPENDECTOMY  03/03/2011   Procedure: APPENDECTOMY LAPAROSCOPIC;  Surgeon: Valarie Merino, MD;  Location: [facility];  Service: General;  Laterality: N/A;  . TONSILLECTOMY      There were no vitals filed for this visit.      Subjective Assessment - 03/04/16 1102    Subjective sore from DN - very sore through the forearm and medial thigh to knee but thinks the DN helped. Pleased with her progress since beginning therapy. Patient has moved from a 10/10 pain level to 1-2/10 level. Gradually continues to improve.    Currently in Pain? Yes   Pain Score 2    Pain Location Back   Pain Orientation Lower;Right   Pain Descriptors / Indicators Dull   Pain Type Acute pain   Pain Score 2   Pain Location Wrist   Pain Orientation Right   Pain Descriptors / Indicators Aching   Pain Type Chronic pain                         [facility] Adult PT Treatment/Exercise - 03/04/16 0001      Lumbar Exercises:  Stretches   Passive Hamstring Stretch 30 seconds;2 reps   Lower Trunk Rotation 5 reps   ITB Stretch 3 reps;30 seconds   Piriformis Stretch 2 reps;30 seconds     Lumbar Exercises: Aerobic   Stationary Bike nustep L4 x 5 min      Lumbar Exercises: Supine   Other Supine Lumbar Exercises hip adductor stretch supine with strap 30 sec x 3      Hand Exercises   Other Hand Exercises wrist flexion and radial deviation stretches.   encouraged more gentle stretching for wrist      Modalities   Modalities Electrical Stimulation;Moist Heat     Moist Heat Therapy   Number Minutes Moist Heat 10 Minutes     Moist Heat Location Lumbar Spine  Rt thigh anterior and posterior, Rt forearm Simultaneous filing. User may not have seen previous data.     Programme researcher, broadcasting/film/video Location Rt forearm, Rt knee   Electrical Stimulation Action premod   Electrical Stimulation Parameters to pt tolerance   Electrical Stimulation Goals Tone;Pain     Ultrasound   Ultrasound Location extensor forearm; medial distal thigh to medial knee    Ultrasound Parameters 100%; 1 mHz; 1.0 w/cm2; 8 min each location  Ultrasound Goals Pain;Other (Comment)     Iontophoresis   Type of Iontophoresis Dexamethasone   Location anatomical snuff box RUE.    Dose 1.3cc   Time 14 hr (40 mA patch)     Manual Therapy   Manual Therapy Soft tissue mobilization;Taping   Soft tissue mobilization Rt LE adductors to medial knee and Rt forearm using IASTW                     PT Long Term Goals - 03/02/16 1641      PT LONG TERM GOAL #1   Title I with advanced HEP to include resuming weight training and light jogging ( 03/30/16)   Time 4   Period Weeks   Status On-going     PT LONG TERM GOAL #2   Title report =/> 75% reduction of pain when she is in bed ( 03/30/16)   Time 4   Period Weeks   Status On-going  slow progress     PT LONG TERM GOAL #3   Title demo Rt hip/knee strength =/> Lt (  03/30/16)   Time 4   Period Weeks   Status On-going     PT LONG TERM GOAL #4   Title report no tenderness with palpation in the rt low back and buttocks ( 03/30/16)   Time 4   Period Weeks   Status On-going     PT LONG TERM GOAL #5   Title improve FOTO =/< 48% limited, CK level ( 03/30/16)   Time 4   Period Weeks   Status On-going     PT LONG TERM GOAL #6   Title demo Rt wrist ROM WNL and painfree ( 03/30/16)    Time 4   Period Weeks   Status On-going     PT LONG TERM GOAL #7   Title demo Rt wrist and elbow =/> 5-/5 without pain ( 03/30/16)    Time 4   Period Weeks   Status On-going     PT LONG TERM GOAL #8   Title report =/> 75% reduction in Rt wrist pain ( 03/30/16)    Time 4   Period Weeks   Status On-going               Plan - 03/04/16 1058    Clinical Impression Statement Veronica [name] reports soreness form DN both in the forearm and thigh/knee area. She does feel the Dn has been helpful. Overall Veronica [name] reports improvement in pain level and increase in functional activity level. she does continue to have pain in the forearm; hip/LB; medial thigh and knee area. Veronica [name] is working to Sport and exercise psychologistmake ergonomic changes for travel when she goes with her husband on extended trips beginning in February. Patient encouraged to decrease intensity of stretch and monitor activity level.    Rehab Potential Good   PT Frequency 2x / week   PT Duration 4 weeks   PT Treatment/Interventions Moist Heat;Ultrasound;Therapeutic exercise;Dry needling;Taping;Manual techniques;Neuromuscular re-education;Cryotherapy;Electrical Stimulation;Functional mobility training;Patient/family education;Iontophoresis 4mg /ml Dexamethasone;Vasopneumatic Device   PT Next Visit Plan possible DN to Rt hamstring, and low back as needed, continue treatment for Rt foream including DN/manual work/ gentle stretching     Consulted and Agree with Plan of Care Patient      Patient will benefit from skilled therapeutic intervention in order to  improve the following deficits and impairments:  Postural dysfunction, Decreased strength, Pain, Hypomobility, Increased muscle spasms, Decreased range of motion  Visit Diagnosis: Chronic right-sided low back  pain, with sciatica presence unspecified  Muscle weakness (generalized)  Back stiffness  Pain in right wrist  Stiffness of right wrist, not elsewhere classified     Problem List Patient Active Problem List   Diagnosis Date Noted  . De Quervain's tenosynovitis, right 01/20/2016  . Lumbago 01/18/2016  . Hypercalcemia 12/28/2015  . Ganglion cyst of wrist 10/27/2015  . Sciatica of right side 10/27/2015  . Right knee pain 10/27/2015  . Diabetic retinopathy (HCC) 03/23/2015  . GERD (gastroesophageal reflux disease) 01/05/2015  . Osteopenia 03/03/2014  . Type 2 diabetes mellitus with microalbuminuria or microproteinuria 03/03/2014  . Hypothyroid 02/28/2014  . Anxiety and depression 02/28/2014   Kynsley Whitehouse P. Leonor Liv PT, MPH 03/04/16 12:57 PM   [facility] [place] [address] [place] [address] [place], [place], [address] Phone: [phone]   Fax:  [phone]  Name: Veronica [name] MRN: [id number] Date of Birth: [birth year]

## 2016-03-04 NOTE — Progress Notes (Signed)
Veronica [name] is a 76 y.o. female who presents to [facility] today for right wrist and leg pain. Patient has been diagnosed with de Quervain's tenosynovitis and low back pain in the past. She's been receiving physical therapy for both he notes significant improvement. She does continue to experience some right wrist pain however. She pain is 2 out of 10. She is still a somewhat limited in her activities. She would like to continue physical therapy she finds to be very effective.   Past Medical History:  Diagnosis Date  . Anemia   . Diabetic retinopathy ([place]) 03/23/2015  . GERD (gastroesophageal reflux disease) 01/05/2015  . Hypothyroid   . Osteopenia 03/03/2014  . Type 2 diabetes mellitus with microalbuminuria or microproteinuria 03/03/2014   Actos added January 2016.     Past Surgical History:  Procedure Laterality Date  . APPENDECTOMY  02/28/11  . CESAREAN SECTION    . LAPAROSCOPIC APPENDECTOMY  03/03/2011   Procedure: APPENDECTOMY LAPAROSCOPIC;  Surgeon: Pedro Earls, MD;  Location: [facility];  Service: General;  Laterality: N/A;  . TONSILLECTOMY     Social History  Substance Use Topics  . Smoking status: Never Smoker  . Smokeless tobacco: Never Used  . Alcohol use No     ROS:  As above   Medications: Current Outpatient Prescriptions  Medication Sig Dispense Refill  . AMBULATORY NON FORMULARY MEDICATION Accucheck  Aviva lancets and test strips 100 each 11  . aspirin 81 MG tablet Take 1 tablet (81 mg total) by mouth daily. 30 tablet   . blood glucose meter kit and supplies KIT Dispense based on patient and insurance preference. Use up to four times daily as directed. Please include lancets, test strips, control solution. 1 each 1  . citalopram (CELEXA) 40 MG tablet Take 1 tablet (40 mg total) by mouth daily. 90 tablet 3  . clonazePAM (KLONOPIN) 0.5 MG tablet Take 1 tablet (0.5 mg total) by mouth 3 (three) times daily as needed  for anxiety. #270(ninety) tablets for 90(ninety) days supply 270 tablet 0  . diclofenac sodium (VOLTAREN) 1 % GEL Apply 4 g topically 4 (four) times daily. To affected joint. 100 g 11  . glucose blood test strip Use up to 4 times per day as directed with glucometer. Disp: 100. Refill x99 100 each 99  . insulin NPH-regular Human (NOVOLIN 70/30) (70-30) 100 UNIT/ML injection Inject into the skin. 6 UNITS AM, 4 UNITS PM. FOLLOWING W/ ENDOCRINOLOGY DR. Hartford Poli    . levothyroxine (SYNTHROID, LEVOTHROID) 88 MCG tablet Take 1 tablet (88 mcg total) by mouth daily. FOLLOWING WITH ENDOCRINOLOGY DR LEVY 1 tablet 0  . metFORMIN (GLUCOPHAGE-XR) 500 MG 24 hr tablet Take by mouth. FOLLOWING W/ ENDOCRINOLOGY DR. Hartford Poli    . methocarbamol (ROBAXIN) 500 MG tablet Take 1 tablet (500 mg total) by mouth at bedtime as needed for muscle spasms. 30 tablet 0  . oxyCODONE-acetaminophen (ROXICET) 5-325 MG tablet Take 1 tablet by mouth every 8 (eight) hours as needed for severe pain. 20 tablet 0  . carisoprodol (SOMA) 350 MG tablet Take 1 tablet (350 mg total) by mouth 3 (three) times daily as needed (musculo-skeletal pain). (Patient not taking: Reported on 03/04/2016) 20 tablet 0   No current facility-administered medications for this visit.    Allergies  Allergen Reactions  . Codeine Hives and Nausea And Vomiting  . Glimepiride Hives  . Keflex [Cephalexin]     hives  . Metformin And Related  diarrhea  . Penicillins   . Septra [Sulfamethoxazole-Trimethoprim] Hives  . Sulfa Antibiotics Hives and Nausea And Vomiting     Exam:  BP (!) 147/84   Pulse 79   Wt 136 lb (61.7 kg)   BMI 21.30 kg/m  General: Well Developed, well nourished, and in no acute distress.  Neuro/Psych: Alert and oriented x3, extra-ocular muscles intact, able to move all 4 extremities, sensation grossly intact. Skin: Warm and dry, no rashes noted.  Respiratory: Not using accessory muscles, speaking in full sentences, trachea midline.    Cardiovascular: Pulses palpable, no extremity edema. Abdomen: Does not appear distended. MSK: Right radial wrist swollen and mildly tender over the radial styloid. Positive Finkelstein's test. L spine: Nontender normal back motion. Normal gait.    No results found for this or any previous visit (from the past 48 hour(s)). No results found.    Assessment and Plan: 76 y.o. female with  De Quervain's tenosynovitis: Improving. Continue physical therapy recheck in 1-2 months.  Lumbago: Continued. Continue physical therapy.    Orders Placed This Encounter  Procedures  . Ambulatory referral to Physical Therapy    Referral Priority:   Routine    Referral Type:   Physical Medicine    Referral Reason:   Specialty Services Required    Requested Specialty:   Physical Therapy    Number of Visits Requested:   1    Discussed warning signs or symptoms. Please see discharge instructions. Patient expresses understanding.

## 2016-03-04 NOTE — Patient Instructions (Signed)
Thank you for coming in today. Return in 1-2 months or sooner if needed.  Continue the PT.

## 2016-03-09 ENCOUNTER — Encounter: Payer: Self-pay | Admitting: Physical Therapy

## 2016-03-10 ENCOUNTER — Ambulatory Visit (INDEPENDENT_AMBULATORY_CARE_PROVIDER_SITE_OTHER): Payer: Medicare PPO | Admitting: Ophthalmology

## 2016-03-11 ENCOUNTER — Encounter: Payer: Self-pay | Admitting: Physical Therapy

## 2016-03-11 ENCOUNTER — Ambulatory Visit (INDEPENDENT_AMBULATORY_CARE_PROVIDER_SITE_OTHER): Payer: Medicare PPO | Admitting: Physical Therapy

## 2016-03-11 DIAGNOSIS — G8929 Other chronic pain: Secondary | ICD-10-CM

## 2016-03-11 DIAGNOSIS — M6281 Muscle weakness (generalized): Secondary | ICD-10-CM

## 2016-03-11 DIAGNOSIS — M545 Low back pain: Secondary | ICD-10-CM

## 2016-03-11 DIAGNOSIS — M25631 Stiffness of right wrist, not elsewhere classified: Secondary | ICD-10-CM

## 2016-03-11 DIAGNOSIS — M2560 Stiffness of unspecified joint, not elsewhere classified: Secondary | ICD-10-CM

## 2016-03-11 DIAGNOSIS — M2569 Stiffness of other specified joint, not elsewhere classified: Secondary | ICD-10-CM

## 2016-03-11 DIAGNOSIS — M25531 Pain in right wrist: Secondary | ICD-10-CM

## 2016-03-11 NOTE — Therapy (Signed)
[place], [place], [address] Phone: [phone]   Fax:  [phone]  Physical Therapy Treatment  Patient Details  Name: Veronica [name] MRN: [id number] Date of Birth: August 22, 1940 Referring Provider: Dr Georgina Snell  Encounter Date: 03/11/2016      PT End of Session - 03/11/16 1357    Visit Number 13   Number of Visits 19   Date for PT Re-Evaluation 03/30/16   PT Start Time 7741   PT Stop Time 1448   PT Time Calculation (min) 50 min      Past Medical History:  Diagnosis Date  . Anemia   . Diabetic retinopathy ([place]) 03/23/2015  . GERD (gastroesophageal reflux disease) 01/05/2015  . Hypothyroid   . Osteopenia 03/03/2014  . Type 2 diabetes mellitus with microalbuminuria or microproteinuria 03/03/2014   Actos added January 2016.      Past Surgical History:  Procedure Laterality Date  . APPENDECTOMY  02/28/11  . CESAREAN SECTION    . LAPAROSCOPIC APPENDECTOMY  03/03/2011   Procedure: APPENDECTOMY LAPAROSCOPIC;  Surgeon: Pedro Earls, MD;  [facility];  Service: General;  Laterality: N/A;  . TONSILLECTOMY      There were no vitals filed for this visit.      Subjective Assessment - 03/11/16 1449    Currently in Pain? Yes   Pain Score 1             [facility] PT Assessment - 03/11/16 0001      Assessment   Medical Diagnosis LBP, Rt sided sciatica, Rt DeQuerveins   Referring Provider Dr Georgina Snell   Onset Date/Surgical Date 07/21/15     Strength   Right/Left Elbow --  bilat WNL no pain   Right Wrist Flexion 5/5  slight pain   Right Wrist Extension 5/5  slight pain   Right Hip Flexion 5/5   Right Hip Extension 5/5   Right Hip ABduction 5/5   Left Hip Flexion 5/5   Left Hip Extension 5/5   Left Hip ABduction 5/5                     [facility] Adult PT Treatment/Exercise - 03/11/16 0001      Lumbar Exercises: Aerobic   Stationary Bike nustep L5 x 5 min   U/LE     Lumbar  Exercises: Standing   Functional Squats 10 reps  with circling arms around head , each side 4# wt.   Other Standing Lumbar Exercises FWD leans, 4#, VC for form keeping back straiight     Lumbar Exercises: Supine   Bridge 10 reps  hold with shoulder horizontal abduction green band   Isometric Hip Flexion 10 reps;5 seconds  each side, ipsa/contralateral     Hand Exercises   Other Hand Exercises finger waves, thumb opposition, wrist stretches.     Modalities   Modalities Iontophoresis;Ultrasound     Ultrasound   Ultrasound Location Rt wristh/thumb   Ultrasound Parameters 50%, 1.0w/cm2   Ultrasound Goals Pain;Edema     Iontophoresis   Type of Iontophoresis Dexamethasone   Location anatomical snuff box RUE.    Dose 1.0cc   Time 1110mp 8 hr patch                     PT Long Term Goals - 03/11/16 1420      PT LONG TERM GOAL #1   Title I with advanced HEP to include resuming weight training and  light jogging ( 03/30/16)   Status On-going  attempted light jogging yesterday and had some increased leg tightness     PT LONG TERM GOAL #2   Title report =/> 75% reduction of pain when she is in bed ( 03/30/16)   Status On-going  having pain in the inner Rt knee at night     PT LONG TERM GOAL #3   Title demo Rt hip/knee strength =/> Lt ( 03/30/16)   Status Achieved     PT LONG TERM GOAL #4   Title report no tenderness with palpation in the rt low back and buttocks ( 03/30/16)   Status On-going     PT LONG TERM GOAL #5   Title improve FOTO =/< 48% limited, CK level ( 03/30/16)   Status On-going     PT LONG TERM GOAL #6   Title demo Rt wrist ROM WNL and painfree ( 03/30/16)    Status On-going     PT LONG TERM GOAL #7   Title demo Rt wrist and elbow =/> 5-/5 without pain ( 03/30/16)    Status Partially Met  met strength goal, not pain component, has pain                Plan - 03/11/16 1633    Clinical Impression Statement Lelon Frohlich has made some progress, met a goal and  progressing to the others.  Her strength is improving.  Holding on stim and heat to the back today.  Pain is still her biggest complaint.    Rehab Potential Good   PT Frequency 2x / week   PT Duration 4 weeks   PT Treatment/Interventions Moist Heat;Ultrasound;Therapeutic exercise;Dry needling;Taping;Manual techniques;Neuromuscular re-education;Cryotherapy;Electrical Stimulation;Functional mobility training;Patient/family education;Iontophoresis 33m/ml Dexamethasone;Vasopneumatic Device   PT Next Visit Plan possible DN to Rt hamstring, and low back as needed, continue treatment for Rt foream including DN/manual work/ gentle stretching     Consulted and Agree with Plan of Care Patient      Patient will benefit from skilled therapeutic intervention in order to improve the following deficits and impairments:  Postural dysfunction, Decreased strength, Pain, Hypomobility, Increased muscle spasms, Decreased range of motion  Visit Diagnosis: Chronic right-sided low back pain, with sciatica presence unspecified  Muscle weakness (generalized)  Back stiffness  Pain in right wrist  Stiffness of right wrist, not elsewhere classified     Problem List Patient Active Problem List   Diagnosis Date Noted  . De Quervain's tenosynovitis, right 01/20/2016  . Lumbago 01/18/2016  . Hypercalcemia 12/28/2015  . Ganglion cyst of wrist 10/27/2015  . Sciatica of right side 10/27/2015  . Right knee pain 10/27/2015  . Diabetic retinopathy ([place] 03/23/2015  . GERD (gastroesophageal reflux disease) 01/05/2015  . Osteopenia 03/03/2014  . Type 2 diabetes mellitus with microalbuminuria or microproteinuria 03/03/2014  . Hypothyroid 02/28/2014  . Anxiety and depression 02/28/2014    SManuela Schwartzshaver PT  03/11/2016, 4:35 PM  [facility] [address]Phone: [phone]  Fax:  [phone] Name: PMarialy UrbanczykMRN:  [id number] of Birth: 1September 09, 1942

## 2016-03-16 ENCOUNTER — Ambulatory Visit (INDEPENDENT_AMBULATORY_CARE_PROVIDER_SITE_OTHER): Payer: Medicare PPO | Admitting: Physical Therapy

## 2016-03-16 DIAGNOSIS — M6281 Muscle weakness (generalized): Secondary | ICD-10-CM | POA: Diagnosis not present

## 2016-03-16 DIAGNOSIS — G8929 Other chronic pain: Secondary | ICD-10-CM

## 2016-03-16 DIAGNOSIS — M25631 Stiffness of right wrist, not elsewhere classified: Secondary | ICD-10-CM | POA: Diagnosis not present

## 2016-03-16 DIAGNOSIS — M545 Low back pain: Secondary | ICD-10-CM | POA: Diagnosis not present

## 2016-03-16 DIAGNOSIS — M25531 Pain in right wrist: Secondary | ICD-10-CM | POA: Diagnosis not present

## 2016-03-16 NOTE — Therapy (Signed)
[place], [place], [address] Phone: [phone]   Fax:  [phone]  Physical Therapy Treatment  Patient Details  Name: Veronica [name] MRN: [id number] Date of Birth: May 08, 1940 Referring Provider: Dr. Georgina Snell   Encounter Date: 03/16/2016      PT End of Session - 03/16/16 1405    Visit Number 14   Number of Visits 19   Date for PT Re-Evaluation 03/30/16   PT Start Time 5916   PT Stop Time 1435   PT Time Calculation (min) 32 min   Activity Tolerance Patient tolerated treatment well;No increased pain   Behavior During Therapy WFL for tasks assessed/performed      Past Medical History:  Diagnosis Date  . Anemia   . Diabetic retinopathy ([place]) 03/23/2015  . GERD (gastroesophageal reflux disease) 01/05/2015  . Hypothyroid   . Osteopenia 03/03/2014  . Type 2 diabetes mellitus with microalbuminuria or microproteinuria 03/03/2014   Actos added January 2016.      Past Surgical History:  Procedure Laterality Date  . APPENDECTOMY  02/28/11  . CESAREAN SECTION    . LAPAROSCOPIC APPENDECTOMY  03/03/2011   Procedure: APPENDECTOMY LAPAROSCOPIC;  Surgeon: Pedro Earls, MD;  [facility];  Service: General;  Laterality: N/A;  . TONSILLECTOMY      There were no vitals filed for this visit.      Subjective Assessment - 03/16/16 1406    Subjective Pt reports her wrist is still bothersome.  She feels her back is doing pretty well. She still gets soreness in knees with jogging 1 mile.  Her Rt wrist is "a different kind of soreness" from lifting weights.    Patient Stated Goals exercise and work out again without pain. Swim, [employer] use scissors, stop dropping items, play flute and piano again   Pain Score 2    Pain Location Wrist   Pain Orientation Right   Pain Descriptors / Indicators Sore   Aggravating Factors  lifting, wiping, dusting.    Pain Relieving Factors TENS, rest.             [facility]  PT Assessment - 03/16/16 0001      Assessment   Medical Diagnosis LBP, Rt sided sciatica, Rt DeQuerveins   Referring Provider Dr. Georgina Snell    Onset Date/Surgical Date 07/21/15     AROM   Right Wrist Extension 72 Degrees   Right Wrist Flexion 90 Degrees   Right Wrist Radial Deviation 12 Degrees  pain at end range   Right Wrist Ulnar Deviation 39 Degrees  pain at end range                     [facility] Adult PT Treatment/Exercise - 03/16/16 0001      Lumbar Exercises: Aerobic   Stationary Bike nustep L5 x 5 min (UE/LE)   U/LE     Hand Exercises   Other Hand Exercises finger waves, thumb opposition, wrist flexion/ ext and thumb stretches.     Ultrasound   Ultrasound Location Rt lateral wrist/ thumb   Ultrasound Parameters 100%, 1.0 w/cm2, 3.70mz   Ultrasound Goals Pain     Manual Therapy   Soft tissue mobilization to Rt wrist extensors/ flexors, thumb extensors                      PT Long Term Goals - 03/11/16 1420      PT LONG TERM GOAL #1   Title  I with advanced HEP to include resuming weight training and light jogging ( 03/30/16)   Status On-going  attempted light jogging yesterday and had some increased leg tightness     PT LONG TERM GOAL #2   Title report =/> 75% reduction of pain when she is in bed ( 03/30/16)   Status On-going  having pain in the inner Rt knee at night     PT LONG TERM GOAL #3   Title demo Rt hip/knee strength =/> Lt ( 03/30/16)   Status Achieved     PT LONG TERM GOAL #4   Title report no tenderness with palpation in the rt low back and buttocks ( 03/30/16)   Status On-going     PT LONG TERM GOAL #5   Title improve FOTO =/< 48% limited, CK level ( 03/30/16)   Status On-going     PT LONG TERM GOAL #6   Title demo Rt wrist ROM WNL and painfree ( 03/30/16)    Status On-going     PT LONG TERM GOAL #7   Title demo Rt wrist and elbow =/> 5-/5 without pain ( 03/30/16)    Status Partially Met  met strength goal, not pain component,  has pain                Plan - 03/16/16 1716    Clinical Impression Statement Pt demonstrated improved Rt wrist ROM.  Her back was painfree today; focus was to the Rt wrist only.  Pt reported decrease in Rt wrist pain at end of session.  Pt continues to make gradual progress towards remaining goals.    Rehab Potential Good   PT Frequency 2x / week   PT Duration 4 weeks   PT Treatment/Interventions Moist Heat;Ultrasound;Therapeutic exercise;Dry needling;Taping;Manual techniques;Neuromuscular re-education;Cryotherapy;Electrical Stimulation;Functional mobility training;Patient/family education;Iontophoresis 15m/ml Dexamethasone;Vasopneumatic Device   PT Next Visit Plan continue treatment for Rt foream including DN/manual work/ gentle stretching     Consulted and Agree with Plan of Care Patient      Patient will benefit from skilled therapeutic intervention in order to improve the following deficits and impairments:  Postural dysfunction, Decreased strength, Pain, Hypomobility, Increased muscle spasms, Decreased range of motion  Visit Diagnosis: Pain in right wrist  Stiffness of right wrist, not elsewhere classified  Chronic right-sided low back pain, with sciatica presence unspecified  Muscle weakness (generalized)     Problem List Patient Active Problem List   Diagnosis Date Noted  . De Quervain's tenosynovitis, right 01/20/2016  . Lumbago 01/18/2016  . Hypercalcemia 12/28/2015  . Ganglion cyst of wrist 10/27/2015  . Sciatica of right side 10/27/2015  . Right knee pain 10/27/2015  . Diabetic retinopathy ([place] 03/23/2015  . GERD (gastroesophageal reflux disease) 01/05/2015  . Osteopenia 03/03/2014  . Type 2 diabetes mellitus with microalbuminuria or microproteinuria 03/03/2014  . Hypothyroid 02/28/2014  . Anxiety and depression 02/28/2014   JKerin Perna PTA 03/16/16 5:20 PM  [place] [address]Phone: [phone]  Fax:  [phone] Name: Veronica ArntsonMRN: [id number] of Birth: 1August 19, 1942

## 2016-03-18 ENCOUNTER — Ambulatory Visit (INDEPENDENT_AMBULATORY_CARE_PROVIDER_SITE_OTHER): Payer: Medicare PPO | Admitting: Physical Therapy

## 2016-03-18 DIAGNOSIS — M25531 Pain in right wrist: Secondary | ICD-10-CM

## 2016-03-18 DIAGNOSIS — M25631 Stiffness of right wrist, not elsewhere classified: Secondary | ICD-10-CM

## 2016-03-18 DIAGNOSIS — M6281 Muscle weakness (generalized): Secondary | ICD-10-CM | POA: Diagnosis not present

## 2016-03-18 DIAGNOSIS — M545 Low back pain: Secondary | ICD-10-CM | POA: Diagnosis not present

## 2016-03-18 DIAGNOSIS — G8929 Other chronic pain: Secondary | ICD-10-CM

## 2016-03-18 NOTE — Therapy (Addendum)
[name] [name], [place], [address] Phone: [phone]   Fax:  [phone]  Physical Therapy Treatment  Patient Details  Name: Veronica [name] MRN: [id number] Date of Birth: 05/11/1940 Referring Provider: Dr. Georgina Snell   Encounter Date: 03/18/2016      PT End of Session - 03/18/16 1458    Visit Number 15   Number of Visits 19   Date for PT Re-Evaluation 03/30/16   PT Start Time 1448   PT Stop Time 1530   PT Time Calculation (min) 42 min      Past Medical History:  Diagnosis Date  . Anemia   . Diabetic retinopathy ([place]) 03/23/2015  . GERD (gastroesophageal reflux disease) 01/05/2015  . Hypothyroid   . Osteopenia 03/03/2014  . Type 2 diabetes mellitus with microalbuminuria or microproteinuria 03/03/2014   Actos added January 2016.      Past Surgical History:  Procedure Laterality Date  . APPENDECTOMY  02/28/11  . CESAREAN SECTION    . LAPAROSCOPIC APPENDECTOMY  03/03/2011   Procedure: APPENDECTOMY LAPAROSCOPIC;  Surgeon: Veronica [name];  [facility];  Service: General;  Laterality: N/A;  . TONSILLECTOMY      There were no vitals filed for this visit.      Subjective Assessment - 03/18/16 1454    Subjective Pt went shopping yesterday and now her Rt knee and wrist are feeling sore again.  She states her wrist felt very good after last session.    Currently in Pain? Yes   Pain Score 3    Pain Location Wrist   Pain Orientation Right   Pain Descriptors / Indicators Sore   Multiple Pain Sites Yes   Pain Score 2   Pain Location Knee   Pain Orientation Right   Pain Descriptors / Indicators Aching;Sore   Aggravating Factors  walk   Pain Relieving Factors ice/heat            [facility] Adult PT Treatment/Exercise - 03/18/16 0001      Lumbar Exercises: Stretches   Passive Hamstring Stretch 2 reps   Passive Hamstring Stretch Limitations (then Rt adductor stretch)    Quad Stretch 2 reps;30  seconds     Lumbar Exercises: Aerobic   Stationary Bike nustep L5 x 7 min (UE/LE)      Lumbar Exercises: Supine   Bridge 5 reps  with marching     Hand Exercises   Digiticizer yellow x 5 reps, increased pain   Rubberbands 15 reps, light band for finger/thumb ext.    Other Hand Exercises wrist ext stretch, bilat 30 sec x 3 reps   Other Hand Exercises velcro board for Rt wrist pronation/supination, flexion/ ext.  VC to correct for to stay in pain free zone.    ball squeeze x 15 reps      Ultrasound   Ultrasound Location Rt lateral wrist    Ultrasound Parameters 100%, 1.0 w/cm2, 47mn     Ultrasound Goals Pain     Manual Therapy   Soft tissue mobilization to Rt wrist extensors/ flexors, thumb extensors             PT Long Term Goals - 03/18/16 1504      PT LONG TERM GOAL #1   Title I with advanced HEP to include resuming weight training and light jogging ( 03/30/16)   Time 4   Period Weeks   Status On-going     PT LONG TERM GOAL #2  Title report =/> 75% reduction of pain when she is in bed ( 03/30/16)   Time 4   Period Weeks   Status On-going     PT LONG TERM GOAL #3   Title demo Rt hip/knee strength =/> Lt ( 03/30/16)   Status Achieved     PT LONG TERM GOAL #4   Title report no tenderness with palpation in the rt low back and buttocks ( 03/30/16)   Time 4   Period Weeks   Status On-going     PT LONG TERM GOAL #5   Title improve FOTO =/< 48% limited, CK level ( 03/30/16)   Time 4   Period Weeks   Status On-going     PT LONG TERM GOAL #6   Title demo Rt wrist ROM WNL and painfree ( 03/30/16)    Time 4   Period Weeks   Status Partially Met     PT LONG TERM GOAL #7   Title demo Rt wrist and elbow =/> 5-/5 without pain ( 03/30/16)    Time 4   Period Weeks   Status Partially Met     PT LONG TERM GOAL #8   Title report =/> 75% reduction in Rt wrist pain ( 03/30/16)    Time 4   Period Weeks   Status Achieved               Plan - 03/18/16 1626    Clinical  Impression Statement Pt reported some increased pain in Rt thumb ext tendons with wrist strengthening exercises; reduced with rest, [place], manual therapy.  She had some discomfort in Rt quad with stretches, but report decreased knee pain at end of session.  Pt has met goal for decreased wrist pain.  Pt reported she was painfree at end of session.     Rehab Potential Good   PT Frequency 2x / week   PT Duration 4 weeks   PT Treatment/Interventions Moist Heat;Ultrasound;Therapeutic exercise;Dry needling;Taping;Manual techniques;Neuromuscular re-education;Cryotherapy;Electrical Stimulation;Functional mobility training;Patient/family education;Iontophoresis 16m/ml Dexamethasone;Vasopneumatic Device   PT Next Visit Plan continue treatment for Rt foream including DN/manual work/ gentle stretching     Consulted and Agree with Plan of Care Patient      Patient will benefit from skilled therapeutic intervention in order to improve the following deficits and impairments:  Postural dysfunction, Decreased strength, Pain, Hypomobility, Increased muscle spasms, Decreased range of motion  Visit Diagnosis: Pain in right wrist  Stiffness of right wrist, not elsewhere classified  Chronic right-sided low back pain, with sciatica presence unspecified  Muscle weakness (generalized)     Problem List Patient Active Problem List   Diagnosis Date Noted  . De Quervain's tenosynovitis, right 01/20/2016  . Lumbago 01/18/2016  . Hypercalcemia 12/28/2015  . Ganglion cyst of wrist 10/27/2015  . Sciatica of right side 10/27/2015  . Right knee pain 10/27/2015  . Diabetic retinopathy ([place] 03/23/2015  . GERD (gastroesophageal reflux disease) 01/05/2015  . Osteopenia 03/03/2014  . Type 2 diabetes mellitus with microalbuminuria or microproteinuria 03/03/2014  . Hypothyroid 02/28/2014  . Anxiety and depression 02/28/2014   Veronica [name] 03/18/16 4:29 PM  [place] [address]Phone: [phone]  Fax:  [phone] Name: Veronica BusserMRN: [id number] of Birth: 110/27/1942 PHYSICAL THERAPY DISCHARGE SUMMARY  Visits from Start of Care: 15  Current functional level related to goals / functional outcomes: unknown   Remaining deficits: unknown   Education /  Equipment: HEP  Plan: Patient agrees to discharge.  Patient goals were partially met. Patient is being discharged due to not returning since the last visit.  ?????She and her husband travel starting in Feb.     Jeral Pinch, PT 04/21/16 2:24 PM

## 2016-03-23 ENCOUNTER — Encounter: Payer: Self-pay | Admitting: Physical Therapy

## 2016-03-24 ENCOUNTER — Ambulatory Visit (INDEPENDENT_AMBULATORY_CARE_PROVIDER_SITE_OTHER): Payer: Medicare PPO | Admitting: Ophthalmology

## 2016-03-25 ENCOUNTER — Encounter: Payer: Self-pay | Admitting: Physical Therapy

## 2016-04-01 ENCOUNTER — Ambulatory Visit: Payer: Self-pay | Admitting: Family Medicine

## 2016-04-13 ENCOUNTER — Ambulatory Visit (INDEPENDENT_AMBULATORY_CARE_PROVIDER_SITE_OTHER): Payer: Medicare PPO | Admitting: Ophthalmology

## 2016-05-05 ENCOUNTER — Other Ambulatory Visit: Payer: Self-pay | Admitting: Osteopathic Medicine

## 2016-05-05 DIAGNOSIS — F329 Major depressive disorder, single episode, unspecified: Secondary | ICD-10-CM

## 2016-05-05 DIAGNOSIS — F32A Depression, unspecified: Secondary | ICD-10-CM

## 2016-05-05 DIAGNOSIS — F419 Anxiety disorder, unspecified: Principal | ICD-10-CM

## 2016-05-05 NOTE — Telephone Encounter (Signed)
Pt called clinic requesting Rx for Klonopin to be sent to: [facility] at [address], [place], [place] [id number].  Routing for review.

## 2016-05-06 MED ORDER — CLONAZEPAM 0.5 MG PO TABS: 0.5000 mg | ORAL_TABLET | Freq: Three times a day (TID) | ORAL | 0 refills | Status: DC | PRN

## 2016-05-06 NOTE — Telephone Encounter (Signed)
OK to refill, put in fax box

## 2016-05-09 ENCOUNTER — Telehealth: Payer: Self-pay

## 2016-05-09 NOTE — Telephone Encounter (Incomplete)
[facility] [place].

## 2016-05-09 NOTE — Telephone Encounter (Signed)
[place], [phone], [address]  Please advise?

## 2016-05-09 NOTE — Telephone Encounter (Signed)
I wrote for this and placed in MA's box last week, if patient called for this (rather than the phramacy) can we call the pharmacy to confirm whether they received the Rx?

## 2016-05-09 NOTE — Telephone Encounter (Signed)
The script would need to be [facility] [place]. (Information in previous note)  [facility]  I spoke with the pharmacy in [place], and they can not fill it as [place] was [place] [facility].

## 2016-05-09 NOTE — Telephone Encounter (Signed)
Placed in fax box - please call and let patient know she can expect it today and please alert us to any other issues. 3 mos supply sent. Additional refills will require office visit

## 2016-08-09 ENCOUNTER — Telehealth: Payer: Self-pay | Admitting: Osteopathic Medicine

## 2016-08-09 DIAGNOSIS — F32A Depression, unspecified: Secondary | ICD-10-CM

## 2016-08-09 DIAGNOSIS — F419 Anxiety disorder, unspecified: Principal | ICD-10-CM

## 2016-08-09 DIAGNOSIS — F329 Major depressive disorder, single episode, unspecified: Secondary | ICD-10-CM

## 2016-08-09 MED ORDER — CLONAZEPAM 0.5 MG PO TABS: 0.5000 mg | ORAL_TABLET | Freq: Three times a day (TID) | ORAL | 0 refills | Status: DC | PRN

## 2016-08-09 NOTE — Telephone Encounter (Signed)
Pt advised of PCP recommendation and Rx refill. Verbalized understanding.

## 2016-08-09 NOTE — Telephone Encounter (Signed)
When I agreed to medication refills for the patient back in December, she told me that she was going to be gone for 3 months in the spring. She and I discussed tapering down on this medication since it is not advisable to be taking this 3 times a day long-term, there are other ways that we can be controlling her anxiety better. We also discussed my reluctance to prescribe controlled substances across state lines. This will be the final refill authorized by me until patient follows up in the office.

## 2016-08-09 NOTE — Telephone Encounter (Signed)
Pt travels with her husband, states this was went over in detail with PCP. She will not be back in town until September to follow up. Requesting Rx be sent to [facility] [address] - [place], [place] - [address]. Routing for approval.

## 2016-09-19 ENCOUNTER — Telehealth: Payer: Self-pay | Admitting: Osteopathic Medicine

## 2016-09-19 DIAGNOSIS — F32A Depression, unspecified: Secondary | ICD-10-CM

## 2016-09-19 DIAGNOSIS — F419 Anxiety disorder, unspecified: Principal | ICD-10-CM

## 2016-09-19 DIAGNOSIS — F329 Major depressive disorder, single episode, unspecified: Secondary | ICD-10-CM

## 2016-09-19 NOTE — Telephone Encounter (Addendum)
Pt called. She needs a refill on Citalopram [facility] to [facility] [address] [place]. She has scheduled an appt for first week in September.  Thank you.

## 2016-09-20 ENCOUNTER — Telehealth: Payer: Self-pay

## 2016-09-20 MED ORDER — CITALOPRAM HYDROBROMIDE 40 MG PO TABS: 40.0000 mg | ORAL_TABLET | Freq: Every day | ORAL | 0 refills | Status: DC

## 2016-09-20 NOTE — Telephone Encounter (Signed)
Rx sent 

## 2016-09-20 NOTE — Telephone Encounter (Signed)
Pt called requesting a refill for Citalopram. Called pharmacy due to pt having a year supply. Pharmacy stated that [place] and picked up 3 months of supply. Pharmacy transferred the remaining 2 months back to [facility].

## 2016-09-20 NOTE — Telephone Encounter (Signed)
Pt returned call and stated that she had forgot that her medication was in a different bag because she travels all over the [place] Pt was informed that [facility] ref[facility] [place], [place]. Pt had understanding and was agreeable.

## 2016-10-26 ENCOUNTER — Ambulatory Visit (INDEPENDENT_AMBULATORY_CARE_PROVIDER_SITE_OTHER): Payer: Medicare PPO | Admitting: Osteopathic Medicine

## 2016-10-26 ENCOUNTER — Encounter: Payer: Self-pay | Admitting: Osteopathic Medicine

## 2016-10-26 VITALS — BP 150/84 | HR 83 | Ht 67.0 in | Wt 141.0 lb

## 2016-10-26 DIAGNOSIS — F419 Anxiety disorder, unspecified: Secondary | ICD-10-CM

## 2016-10-26 DIAGNOSIS — F329 Major depressive disorder, single episode, unspecified: Secondary | ICD-10-CM | POA: Diagnosis not present

## 2016-10-26 DIAGNOSIS — Z23 Encounter for immunization: Secondary | ICD-10-CM

## 2016-10-26 DIAGNOSIS — E119 Type 2 diabetes mellitus without complications: Secondary | ICD-10-CM

## 2016-10-26 DIAGNOSIS — F32A Depression, unspecified: Secondary | ICD-10-CM

## 2016-10-26 MED ORDER — CITALOPRAM HYDROBROMIDE 40 MG PO TABS: 40.0000 mg | ORAL_TABLET | Freq: Every day | ORAL | 1 refills | Status: DC

## 2016-10-26 MED ORDER — BUPROPION HCL ER (XL) 150 MG PO TB24: 150.0000 mg | ORAL_TABLET | ORAL | 1 refills | Status: DC

## 2016-10-26 MED ORDER — CICLOPIROX 8 % EX SOLN: Freq: Every day | CUTANEOUS | 5 refills | Status: DC

## 2016-10-26 MED ORDER — CLONAZEPAM 0.5 MG PO TABS: 0.5000 mg | ORAL_TABLET | Freq: Three times a day (TID) | ORAL | 0 refills | Status: DC | PRN

## 2016-10-26 NOTE — Progress Notes (Signed)
HPI: Veronica [name] is a 76 y.o. female  who presents to [place] today, 10/26/16,  for chief complaint of:  Chief Complaint  Patient presents with  . Follow-up    depression    Having panic attacks even on the Clonazepam. Having obsessive worries about death, no suicidal thoughts. Patient has been traveling across the country in RV with husband, getting medication refills has been a bit of a challenge. She has tapered herself down to lower dose/frequency of the clonazepam to 1/2 tablet about twice per day but finds that she is really struggling at this dose.  Previous prescriptions for depression: Lexapro didn't work, Zoloft didn't work, Paxil caused side effects. Celexa been on this many years and doing just okay. Thinks she may have been on Wellbutrin in the past but just by itself, doesn't remember why she was taken off this medicine but doesn't recall having any adverse effects.      Past medical history, surgical history, social history and family history reviewed.  Patient Active Problem List   Diagnosis Date Noted  . De Quervain's tenosynovitis, right 01/20/2016  . Lumbago 01/18/2016  . Hypercalcemia 12/28/2015  . Ganglion cyst of wrist 10/27/2015  . Sciatica of right side 10/27/2015  . Right knee pain 10/27/2015  . Diabetic retinopathy ([place]) 03/23/2015  . GERD (gastroesophageal reflux disease) 01/05/2015  . Osteopenia 03/03/2014  . Type 2 diabetes mellitus with microalbuminuria or microproteinuria 03/03/2014  . Hypothyroid 02/28/2014  . Anxiety and depression 02/28/2014    Current medication list and allergy/intolerance information reviewed.   Current Outpatient Prescriptions on File Prior to Visit  Medication Sig Dispense Refill  . AMBULATORY NON FORMULARY MEDICATION Accucheck  Aviva lancets and test strips 100 each 11  . aspirin 81 MG tablet Take 1 tablet (81 mg total) by mouth daily. 30 tablet   . blood glucose meter kit and  supplies KIT Dispense based on patient and insurance preference. Use up to four times daily as directed. Please include lancets, test strips, control solution. 1 each 1  . carisoprodol (SOMA) 350 MG tablet Take 1 tablet (350 mg total) by mouth 3 (three) times daily as needed (musculo-skeletal pain). (Patient not taking: Reported on 03/04/2016) 20 tablet 0  . citalopram (CELEXA) 40 MG tablet Take 1 tablet (40 mg total) by mouth daily. Must keep upcoming appointment. 90 tablet 0  . clonazePAM (KLONOPIN) 0.5 MG tablet Take 1 tablet (0.5 mg total) by mouth 3 (three) times daily as needed for anxiety. #270 tablets for 90(ninety) days supply 270 tablet 0  . diclofenac sodium (VOLTAREN) 1 % GEL Apply 4 g topically 4 (four) times daily. To affected joint. 100 g 11  . glucose blood test strip Use up to 4 times per day as directed with glucometer. Disp: 100. Refill x99 100 each 99  . insulin NPH-regular Human (NOVOLIN 70/30) (70-30) 100 UNIT/ML injection Inject into the skin. 6 UNITS AM, 4 UNITS PM. FOLLOWING W/ ENDOCRINOLOGY DR. Hartford Poli    . levothyroxine (SYNTHROID, LEVOTHROID) 88 MCG tablet Take 1 tablet (88 mcg total) by mouth daily. FOLLOWING WITH ENDOCRINOLOGY DR LEVY 1 tablet 0  . metFORMIN (GLUCOPHAGE-XR) 500 MG 24 hr tablet Take by mouth. FOLLOWING W/ ENDOCRINOLOGY DR. Hartford Poli    . methocarbamol (ROBAXIN) 500 MG tablet Take 1 tablet (500 mg total) by mouth at bedtime as needed for muscle spasms. 30 tablet 0  . oxyCODONE-acetaminophen (ROXICET) 5-325 MG tablet Take 1 tablet by mouth every 8 (eight) hours  as needed for severe pain. 20 tablet 0   No current facility-administered medications on file prior to visit.    Allergies  Allergen Reactions  . Codeine Hives and Nausea And Vomiting  . Glimepiride Hives  . Keflex [Cephalexin]     hives  . Metformin And Related     diarrhea  . Penicillins   . Septra [Sulfamethoxazole-Trimethoprim] Hives  . Sulfa Antibiotics Hives and Nausea And Vomiting       Review of Systems:  Constitutional: No recent illness, feels well today except anxiety  Cardiac: No  chest pain, No  pressure, No palpitations  Respiratory:  No  shortness of breath. No  Cough  Gastrointestinal: No  abdominal pain  Musculoskeletal: No new myalgia/arthralgia  Skin: No  Rash  Neurologic: No  weakness, No  Dizziness  Psychiatric: No  concerns with depression, +concerns with anxiety  Exam:  BP (!) 150/84   Pulse 83   Ht 5' 7" (1.702 m)   Wt 141 lb (64 kg)   BMI 22.08 kg/m   Constitutional: VS see above. General Appearance: alert, well-developed, well-nourished, NAD  Eyes: Normal lids and conjunctive, non-icteric sclera  Ears, Nose, Mouth, Throat: MMM, Normal external inspection ears/nares/mouth/lips/gums.  Neck: No masses, trachea midline.   Respiratory: Normal respiratory effort. no wheeze, no rhonchi, no rales  Cardiovascular: S1/S2 normal, no murmur, no rub/gallop auscultated. RRR.   Musculoskeletal: Gait normal. Symmetric and independent movement of all extremities  Neurological: Normal balance/coordination. No tremor.  Skin: warm, dry, intact.   Psychiatric: Normal judgment/insight. Normal mood and affect. Oriented x3.     ASSESSMENT/PLAN:   Veronica [name] go ahead and refill of clonazepam, patient is on maximum dose of Celexa, augment with Wellbutrin and patient would like to seek counseling. I think this sounds like a reasonable plan, ideally would like to be off of benzodiazepines but she has been on these consistently for years so tapering off will be quite a challenge.   If doing well on medicines, okay to refill until her next follow-up, if she is not doing well or wants to discuss change, follow-up with me in 4 weeks after being on Wellbutrin 150, sooner if needed  Anxiety and depression - Plan: clonazePAM (KLONOPIN) 0.5 MG tablet, citalopram (CELEXA) 40 MG tablet, buPROPion (WELLBUTRIN XL) 150 MG 24 hr tablet, Ambulatory referral to  [place] and depression - Refill of clonazepam 3 months due to upcoming travel - Plan: clonazePAM (KLONOPIN) 0.5 MG tablet, citalopram (CELEXA) 40 MG tablet, buPROPion (WELLBUTRIN XL) 150 MG 24 hr tablet, Ambulatory referral to [facility]  Diabetes mellitus without complication ([place]) - Will cancel A1c, patient follows with endocrinology - Plan: POCT HgB A1C  Need for immunization against influenza - Plan: Flu Vaccine QUAD 36+ mos IM    Patient Instructions  Plan:  Back on Klonopin three times per day  Keep Celexa same  Add Wellbutrin  If doing well, call me when you need refills and plan for routine follow-ups and refills in 3 months   If still struggling with mood despite the above changes, come see me in 4 weeks to follow up on the medications changes, or sooner if needed    Follow-up plan: Return in about 4 weeks (around 11/23/2016) for recheck anxiety and depression .  Visit summary with medication list and pertinent instructions was printed for patient to review, alert [place] if any changes needed. All questions at time of visit were answered - patient instructed to contact office with  any additional concerns. ER/RTC precautions were reviewed with the patient and understanding verbalized.   Note: Total time spent 25 minutes, greater than 50% of the visit was spent face-to-face counseling and coordinating care for the following: The primary encounter diagnosis was Anxiety and depression. Diagnoses of Anxiety and depression, Diabetes mellitus without complication ([place]), and Need for immunization against influenza were also pertinent to this visit.[name]

## 2016-10-26 NOTE — Patient Instructions (Signed)
Plan:  Back on Klonopin three times per day  Keep Celexa same  Add Wellbutrin  If doing well, call me when you need refills and plan for routine follow-ups and refills in 3 months   If still struggling with mood despite the above changes, come see me in 4 weeks to follow up on the medications changes, or sooner if needed

## 2016-10-28 DIAGNOSIS — Z23 Encounter for immunization: Secondary | ICD-10-CM | POA: Diagnosis not present

## 2016-11-10 ENCOUNTER — Ambulatory Visit (HOSPITAL_COMMUNITY): Payer: Self-pay | Admitting: Licensed Clinical Social Worker

## 2016-11-21 ENCOUNTER — Encounter: Payer: Self-pay | Admitting: Osteopathic Medicine

## 2016-11-21 ENCOUNTER — Ambulatory Visit (INDEPENDENT_AMBULATORY_CARE_PROVIDER_SITE_OTHER): Payer: Medicare PPO | Admitting: Osteopathic Medicine

## 2016-11-21 VITALS — BP 144/88 | HR 76 | Ht 67.0 in | Wt 142.0 lb

## 2016-11-21 DIAGNOSIS — F329 Major depressive disorder, single episode, unspecified: Secondary | ICD-10-CM | POA: Diagnosis not present

## 2016-11-21 DIAGNOSIS — F419 Anxiety disorder, unspecified: Secondary | ICD-10-CM | POA: Diagnosis not present

## 2016-11-21 DIAGNOSIS — F32A Depression, unspecified: Secondary | ICD-10-CM

## 2016-11-21 NOTE — Patient Instructions (Addendum)
Plan: Increase the Wellbutrin to 300 mg per day  You can take two of the 150 mg tablets in the morning at the same time OR  You can take on in the morning and the second one at lunch or in the evening

## 2016-11-21 NOTE — Progress Notes (Signed)
HPI: Veronica [name] is a 76 y.o. female  who presents to [place] today, 11/21/16,  for chief complaint of:  Chief Complaint  Patient presents with  . Follow-up    ANXIETY AND DEPRESSION      At last visit, we decided to restart Wellbutrin as an augmentation to Celexa. She noticed almost immediate improvement in her mood though now she states she fills the medication is wearing off a bit later in the day. No adverse effects from the medication. She states excessive thoughts of death/passive death wish have subsided. She just notes a little bit more of a depressed mood in the evenings.     Past medical history, surgical history, social history and family history reviewed.  Patient Active Problem List   Diagnosis Date Noted  . De Quervain's tenosynovitis, right 01/20/2016  . Lumbago 01/18/2016  . Hypercalcemia 12/28/2015  . Ganglion cyst of wrist 10/27/2015  . Sciatica of right side 10/27/2015  . Right knee pain 10/27/2015  . Diabetic retinopathy ([place]) 03/23/2015  . GERD (gastroesophageal reflux disease) 01/05/2015  . Osteopenia 03/03/2014  . Type 2 diabetes mellitus with microalbuminuria or microproteinuria 03/03/2014  . Hypothyroid 02/28/2014  . Anxiety and depression 02/28/2014    Current medication list and allergy/intolerance information reviewed.   Current Outpatient Prescriptions on File Prior to Visit  Medication Sig Dispense Refill  . AMBULATORY NON FORMULARY MEDICATION Accucheck  Aviva lancets and test strips 100 each 11  . aspirin 81 MG tablet Take 1 tablet (81 mg total) by mouth daily. 30 tablet   . blood glucose meter kit and supplies KIT Dispense based on patient and insurance preference. Use up to four times daily as directed. Please include lancets, test strips, control solution. 1 each 1  . buPROPion (WELLBUTRIN XL) 150 MG 24 hr tablet Take 1 tablet (150 mg total) by mouth every morning. 90 tablet 1  . ciclopirox (PENLAC)  8 % solution Apply topically at bedtime. Apply over nail and surrounding skin. Apply daily over previous coat. After seven (7) days, may remove with alcohol and continue cycle. 12 mL 5  . citalopram (CELEXA) 40 MG tablet Take 1 tablet (40 mg total) by mouth daily. Must keep upcoming appointment. 90 tablet 1  . clonazePAM (KLONOPIN) 0.5 MG tablet Take 1 tablet (0.5 mg total) by mouth 3 (three) times daily as needed for anxiety. #270 tablets for 90(ninety) days supply 270 tablet 0  . diclofenac sodium (VOLTAREN) 1 % GEL Apply 4 g topically 4 (four) times daily. To affected joint. 100 g 11  . glucose blood test strip Use up to 4 times per day as directed with glucometer. Disp: 100. Refill x99 100 each 99  . insulin NPH-regular Human (NOVOLIN 70/30) (70-30) 100 UNIT/ML injection Inject into the skin. 6 UNITS AM, 4 UNITS PM. FOLLOWING W/ ENDOCRINOLOGY DR. Hartford Poli    . levothyroxine (SYNTHROID, LEVOTHROID) 88 MCG tablet Take 1 tablet (88 mcg total) by mouth daily. FOLLOWING WITH ENDOCRINOLOGY DR LEVY 1 tablet 0  . metFORMIN (GLUCOPHAGE-XR) 500 MG 24 hr tablet Take by mouth. FOLLOWING W/ ENDOCRINOLOGY DR. Hartford Poli     No current facility-administered medications on file prior to visit.    Allergies  Allergen Reactions  . Codeine Hives and Nausea And Vomiting  . Glimepiride Hives  . Keflex [Cephalexin]     hives  . Metformin And Related     diarrhea  . Penicillins   . Septra [Sulfamethoxazole-Trimethoprim] Hives  . Sulfa Antibiotics  Hives and Nausea And Vomiting      Review of Systems:  Constitutional: No recent illness  Cardiac: No  chest pain\  Respiratory:  No  shortness of breath.   Musculoskeletal: No new myalgia/arthralgia  Neurologic: No  weakness, No  Dizziness  Psychiatric: +concerns with depression, +concerns with anxiety  Exam:  BP (!) 144/88   Pulse 76   Ht 5' 7"  (1.702 m)   Wt 142 lb (64.4 kg)   BMI 22.24 kg/m   Constitutional: VS see above. General Appearance: alert,  well-developed, well-nourished, NAD  Respiratory: Normal respiratory effort.   Neurological: Normal balance/coordination. No tremor.  Skin: warm, dry, intact.   Psychiatric: Normal judgment/insight. Normal mood and affect. Oriented x3.     GAD 7 : Generalized Anxiety Score 11/21/2016  Nervous, Anxious, on Edge 2  Control/stop worrying 2  Worry too much - different things 2  Trouble relaxing 2  Restless 0  Easily annoyed or irritable 1  Afraid - awful might happen 1  Total GAD 7 Score 10    Depression screen [facility] 2/9 11/21/2016 10/26/2016  Decreased Interest 2 1  Down, Depressed, Hopeless 2 1  PHQ - 2 Score 4 2  Altered sleeping 2 1  Tired, decreased energy 3 0  Change in appetite 3 1  Feeling bad or failure about yourself  2 1  Trouble concentrating 2 0  Moving slowly or fidgety/restless 0 0  Suicidal thoughts 0 -  PHQ-9 Score 16 5  Difficult doing work/chores Somewhat difficult -      ASSESSMENT/PLAN:   Anxiety and depression    Patient Instructions  Plan: Increase the Wellbutrin to 300 mg per day  You can take two of the 150 mg tablets in the morning at the same time OR  You can take on in the morning and the second one at lunch or in the evening     Follow-up plan: Return in about 4 weeks (around 12/19/2016) for recheck depression/anxiety if needed, if doing well, can call us and let us know! .  Visit summary with medication list and pertinent instructions was printed for patient to review, alert [place] if any changes needed. All questions at time of visit were answered - patient instructed to contact office with any additional concerns. ER/RTC precautions were reviewed with the patient and understanding verbalized.   Note: Total time spent 15 minutes, greater than 50% of the visit was spent face-to-face counseling and coordinating care for the following: The encounter diagnosis was Anxiety and depression.[name]

## 2016-12-19 ENCOUNTER — Other Ambulatory Visit: Payer: Self-pay | Admitting: Osteopathic Medicine

## 2016-12-19 NOTE — Progress Notes (Signed)
Update med list from endocrine notes

## 2016-12-26 ENCOUNTER — Telehealth: Payer: Self-pay

## 2016-12-26 DIAGNOSIS — F32A Depression, unspecified: Secondary | ICD-10-CM

## 2016-12-26 DIAGNOSIS — F329 Major depressive disorder, single episode, unspecified: Secondary | ICD-10-CM

## 2016-12-26 DIAGNOSIS — F419 Anxiety disorder, unspecified: Principal | ICD-10-CM

## 2016-12-26 MED ORDER — BUPROPION HCL ER (XL) 300 MG PO TB24: 300.0000 mg | ORAL_TABLET | ORAL | 3 refills | Status: DC

## 2016-12-26 NOTE — Telephone Encounter (Signed)
Patient advised.

## 2016-12-26 NOTE — Telephone Encounter (Signed)
[facility]

## 2016-12-26 NOTE — Telephone Encounter (Signed)
Veronica [name] called and left a message stating Dr Lyn HollingsheadAlexander increased her Wellbutrin to 300 mg daily. She needs a refill.

## 2017-01-26 ENCOUNTER — Other Ambulatory Visit: Payer: Self-pay | Admitting: Osteopathic Medicine

## 2017-01-26 DIAGNOSIS — F329 Major depressive disorder, single episode, unspecified: Secondary | ICD-10-CM

## 2017-01-26 DIAGNOSIS — F419 Anxiety disorder, unspecified: Principal | ICD-10-CM

## 2017-01-26 DIAGNOSIS — F32A Depression, unspecified: Secondary | ICD-10-CM

## 2017-01-27 ENCOUNTER — Other Ambulatory Visit: Payer: Self-pay | Admitting: Osteopathic Medicine

## 2017-01-27 ENCOUNTER — Telehealth: Payer: Self-pay | Admitting: Osteopathic Medicine

## 2017-01-27 DIAGNOSIS — F329 Major depressive disorder, single episode, unspecified: Secondary | ICD-10-CM

## 2017-01-27 DIAGNOSIS — F419 Anxiety disorder, unspecified: Principal | ICD-10-CM

## 2017-01-27 DIAGNOSIS — F32A Depression, unspecified: Secondary | ICD-10-CM

## 2017-01-27 MED ORDER — CLONAZEPAM 0.5 MG PO TABS: 0.5000 mg | ORAL_TABLET | Freq: Three times a day (TID) | ORAL | 2 refills | Status: DC | PRN

## 2017-01-27 NOTE — Telephone Encounter (Signed)
[facility]

## 2017-01-27 NOTE — Telephone Encounter (Signed)
Pt advised.

## 2017-01-27 NOTE — Telephone Encounter (Signed)
Pt called. She is requesting refill on clonazepam. In computer it is showing "print". [facility]  Thanks!

## 2017-02-02 ENCOUNTER — Encounter (INDEPENDENT_AMBULATORY_CARE_PROVIDER_SITE_OTHER): Payer: Medicare PPO | Admitting: Ophthalmology

## 2017-02-02 DIAGNOSIS — E113293 Type 2 diabetes mellitus with mild nonproliferative diabetic retinopathy without macular edema, bilateral: Secondary | ICD-10-CM

## 2017-02-02 DIAGNOSIS — H43813 Vitreous degeneration, bilateral: Secondary | ICD-10-CM | POA: Diagnosis not present

## 2017-02-02 DIAGNOSIS — H33303 Unspecified retinal break, bilateral: Secondary | ICD-10-CM | POA: Diagnosis not present

## 2017-02-02 DIAGNOSIS — E11319 Type 2 diabetes mellitus with unspecified diabetic retinopathy without macular edema: Secondary | ICD-10-CM

## 2017-02-02 LAB — HM DIABETES EYE EXAM

## 2017-02-03 ENCOUNTER — Encounter: Payer: Self-pay | Admitting: Osteopathic Medicine

## 2017-03-15 ENCOUNTER — Ambulatory Visit: Payer: Self-pay

## 2017-04-26 ENCOUNTER — Other Ambulatory Visit: Payer: Self-pay | Admitting: Osteopathic Medicine

## 2017-04-26 DIAGNOSIS — F32A Depression, unspecified: Secondary | ICD-10-CM

## 2017-04-26 DIAGNOSIS — F419 Anxiety disorder, unspecified: Principal | ICD-10-CM

## 2017-04-26 DIAGNOSIS — F329 Major depressive disorder, single episode, unspecified: Secondary | ICD-10-CM

## 2017-04-26 NOTE — Telephone Encounter (Signed)
Pharmacy requesting med refills. Thanks.

## 2017-04-27 ENCOUNTER — Other Ambulatory Visit: Payer: Self-pay

## 2017-04-27 DIAGNOSIS — F329 Major depressive disorder, single episode, unspecified: Secondary | ICD-10-CM

## 2017-04-27 DIAGNOSIS — F32A Depression, unspecified: Secondary | ICD-10-CM

## 2017-04-27 DIAGNOSIS — F419 Anxiety disorder, unspecified: Principal | ICD-10-CM

## 2017-04-27 MED ORDER — CLONAZEPAM 0.5 MG PO TABS: 0.5000 mg | ORAL_TABLET | Freq: Three times a day (TID) | ORAL | 2 refills | Status: DC | PRN

## 2017-04-27 NOTE — Telephone Encounter (Signed)
E prescription is not working at this time, I printed prescription. Not sure which pharmacy is requesting a refill? [facility] assume [facility] [place]?

## 2017-04-27 NOTE — Telephone Encounter (Signed)
Veronica [name] requests a refill of clonazepam. I scheduled her for a follow up next week.

## 2017-04-28 NOTE — Telephone Encounter (Signed)
rx faxed

## 2017-05-01 ENCOUNTER — Telehealth: Payer: Self-pay

## 2017-05-01 NOTE — Telephone Encounter (Signed)
This should have been sent on 04/27/2017 based on Epic records

## 2017-05-01 NOTE — Telephone Encounter (Signed)
[facility] [place] - spoke to Dun[place]Kendra, [employer] [place] [facility] - verified/confirmed Clonazepam 0.5 mg has been refilled - apologized for the earlier call our office received.

## 2017-05-01 NOTE — Telephone Encounter (Signed)
[facility] @ [phone] requesting med refill for clonazepam 0.5mg . Thanks.

## 2017-05-01 NOTE — Telephone Encounter (Signed)
As per pharmacist Morrie SheldonAshley - duplicate request. Pls disregard.

## 2017-05-02 ENCOUNTER — Ambulatory Visit: Payer: Medicare PPO | Admitting: Osteopathic Medicine

## 2017-05-02 ENCOUNTER — Encounter: Payer: Self-pay | Admitting: Osteopathic Medicine

## 2017-05-02 VITALS — BP 141/79 | HR 87 | Temp 97.7°F | Wt 137.1 lb

## 2017-05-02 DIAGNOSIS — J329 Chronic sinusitis, unspecified: Secondary | ICD-10-CM

## 2017-05-02 DIAGNOSIS — F419 Anxiety disorder, unspecified: Secondary | ICD-10-CM | POA: Diagnosis not present

## 2017-05-02 DIAGNOSIS — B9789 Other viral agents as the cause of diseases classified elsewhere: Secondary | ICD-10-CM | POA: Diagnosis not present

## 2017-05-02 DIAGNOSIS — F329 Major depressive disorder, single episode, unspecified: Secondary | ICD-10-CM

## 2017-05-02 DIAGNOSIS — J069 Acute upper respiratory infection, unspecified: Secondary | ICD-10-CM | POA: Diagnosis not present

## 2017-05-02 DIAGNOSIS — F32A Depression, unspecified: Secondary | ICD-10-CM

## 2017-05-02 DIAGNOSIS — B9689 Other specified bacterial agents as the cause of diseases classified elsewhere: Secondary | ICD-10-CM | POA: Diagnosis not present

## 2017-05-02 MED ORDER — BUPROPION HCL ER (XL) 150 MG PO TB24: 150.0000 mg | ORAL_TABLET | Freq: Every day | ORAL | 3 refills | Status: DC

## 2017-05-02 MED ORDER — AZITHROMYCIN 250 MG PO TABS: ORAL_TABLET | ORAL | 0 refills | Status: AC

## 2017-05-02 NOTE — Patient Instructions (Addendum)
Over-the-Counter Medications & Home Remedies for Upper Respiratory Illness  Note: the following list assumes no pregnancy, normal liver & kidney function and no other drug interactions. Dr. Samil Mecham has highlighted medications which are safe for you to use, but these may not be appropriate for everyone. Always ask a pharmacist or qualified medical provider if you have any questions!   Aches/Pains, Fever, Headache Acetaminophen (Tylenol) 500 mg tablets - take max 2 tablets (1000 mg) every 6 hours (4 times per day)  Ibuprofen (Motrin) 200 mg tablets - take max 4 tablets (800 mg) every 6 hours*  Sinus Congestion Nasal Saline if desired Oxymetolazone (Afrin, others) sparing use due to rebound congestion, NEVER use in kids Phenylephrine (Sudafed) 10 mg tablets every 4 hours (or the 12-hour formulation)* Diphenhydramine (Benadryl) 25 mg tablets - take max 2 tablets every 4 hours  Cough & Sore Throat Dextromethorphan (Robitussin, others) - cough suppressant Guaifenesin (Robitussin, Mucinex, others) - expectorant (helps cough up mucus) (Dextromethorphan and Guaifenesin also come in a combination tablet) Lozenges w/ Benzocaine + Menthol (Cepacol) Honey - as much as you want! Teas which "coat the throat" - look for ingredients Elm Bark, Licorice Root, Marshmallow Root  Other Antibiotics if these are prescribed - take ALL, even if you're feeling better  Zinc Lozenges within 24 hours of symptoms onset - mixed evidence this shortens the duration of the common cold Don't waste your money on Vitamin C or Echinacea  *Caution in patients with high blood pressure    

## 2017-05-02 NOTE — Progress Notes (Signed)
HPI: Veronica [name] is a 77 y.o. female who  has a past medical history of Anemia, Diabetic retinopathy ([place]) (03/23/2015), GERD (gastroesophageal reflux disease) (01/05/2015), Hypothyroid, Osteopenia (03/03/2014), and Type 2 diabetes mellitus with microalbuminuria or microproteinuria (03/03/2014).  she presents to [facility] today, 05/02/17,  for chief complaint of:  Anxiety follow-up Cough   Sore throat and coughing, tightness feeling in the lungs, feels like bronchitis. (+)subjective fever, maybe 4-5 days ago. This went away. OTC meds tried: aspirin, ibuprofen, guaifenesin. Feeling sick for about a week.   Depression: saw a counselor recently but didn't feel like they were meshing. She states a lot of past issues bother her, the counselor that she went to seemed to want to focus on the present or the future. She is still taking clonazepam and unable to wean off of this. She would like to try increasing the Wellbutrin, she has stopped the Celexa without noticing any difference but states the Wellbutrin is helping.  Depression screen [facility] 2/9 11/21/2016 10/26/2016  Decreased Interest 2 1  Down, Depressed, Hopeless 2 1  PHQ - 2 Score 4 2  Altered sleeping 2 1  Tired, decreased energy 3 0  Change in appetite 3 1  Feeling bad or failure about yourself  2 1  Trouble concentrating 2 0  Moving slowly or fidgety/restless 0 0  Suicidal thoughts 0 -  PHQ-9 Score 16 5  Difficult doing work/chores Somewhat difficult -   GAD 7 : Generalized Anxiety Score 11/21/2016  Nervous, Anxious, on Edge 2  Control/stop worrying 2  Worry too much - different things 2  Trouble relaxing 2  Restless 0  Easily annoyed or irritable 1  Afraid - awful might happen 1  Total GAD 7 Score 10         Past medical history, surgical history, social history and family history reviewed. No updates needed.   Current medication list and allergy/intolerance information reviewed.     Current Outpatient Medications on File Prior to Visit  Medication Sig Dispense Refill  . AMBULATORY NON FORMULARY MEDICATION Accucheck  Aviva lancets and test strips 100 each 11  . aspirin 81 MG tablet Take 1 tablet (81 mg total) by mouth daily. 30 tablet   . blood glucose meter kit and supplies KIT Dispense based on patient and insurance preference. Use up to four times daily as directed. Please include lancets, test strips, control solution. 1 each 1  . buPROPion (WELLBUTRIN XL) 300 MG 24 hr tablet Take 1 tablet (300 mg total) every morning by mouth. 90 tablet 3  . ciclopirox (PENLAC) 8 % solution Apply topically at bedtime. Apply over nail and surrounding skin. Apply daily over previous coat. After seven (7) days, may remove with alcohol and continue cycle. 12 mL 5  . citalopram (CELEXA) 40 MG tablet Take 1 tablet (40 mg total) by mouth daily. Must keep upcoming appointment. 90 tablet 1  . clonazePAM (KLONOPIN) 0.5 MG tablet Take 1 tablet (0.5 mg total) by mouth 3 (three) times daily as needed for anxiety. for anxiety. #90 for thirty days 90 tablet 2  . glucose blood test strip Use up to 4 times per day as directed with glucometer. Disp: 100. Refill x99 100 each 99  . insulin aspart (FIASP FLEXTOUCH) 100 UNIT/ML FlexPen Inject into the skin.    [name] insulin degludec (TRESIBA) 100 UNIT/ML SOPN FlexTouch Pen Inject into the skin.    [name] levothyroxine (SYNTHROID, LEVOTHROID) 88 MCG tablet Take 1 tablet (  88 mcg total) by mouth daily. FOLLOWING WITH ENDOCRINOLOGY DR LEVY 1 tablet 0  . diclofenac sodium (VOLTAREN) 1 % GEL Apply 4 g topically 4 (four) times daily. To affected joint. (Patient not taking: Reported on 05/02/2017) 100 g 11  . insulin NPH Human (HUMULIN N,NOVOLIN N) 100 UNIT/ML injection Inject into the skin.    [name] insulin regular (NOVOLIN R,HUMULIN R) 100 units/mL injection Inject into the skin.    . metFORMIN (GLUCOPHAGE-XR) 500 MG 24 hr tablet Take by mouth. FOLLOWING W/ ENDOCRINOLOGY DR.  [place] Poli     No current facility-administered medications on file prior to visit.    Allergies  Allergen Reactions  . Dulaglutide Palpitations    SOB. N/V  . Cephalexin Other (See Comments)    hives hives  . Sitagliptin Other (See Comments)    Insomnia  . Sulfamethoxazole-Trimethoprim Hives  . Codeine Hives and Nausea And Vomiting  . Ezetimibe   . Glimepiride Hives  . Guaifenesin   . Metformin And Related     diarrhea  . Penicillins   . Pseudoephedrine Hcl   . Sulfa Antibiotics Hives and Nausea And Vomiting      Review of Systems:  Constitutional: +recent illness  HEENT: No  headache, no vision change, +persistent sinus pressure  Cardiac: No  chest pain, No  pressure, No palpitations  Respiratory:  No  shortness of breath. No  Cough  Gastrointestinal: No  abdominal pain, no change on bowel habits  Musculoskeletal: No new myalgia/arthralgia  Skin: No  Rash  Hem/Onc: No  easy bruising/bleeding, No  abnormal lumps/bumps  Neurologic: No  weakness, No  Dizziness  Psychiatric: No  concerns with depression, No  concerns with anxiety  Exam:  BP (!) 141/79   Pulse 87   Temp 97.7 F (36.5 C) (Oral)   Wt 137 lb 1.9 oz (62.2 kg)   BMI 21.48 kg/m   Constitutional: VS see above. General Appearance: alert, well-developed, well-nourished, NAD  Eyes: Normal lids and conjunctive, non-icteric sclera  Ears, Nose, Mouth, Throat: MMM, Normal external inspection ears/nares/mouth/lips/gums.TM normal bilaterally. Slight erythematous pharynx, no exudate  Neck: No masses, trachea midline. No lymphadenopathy  Respiratory: Normal respiratory effort. no wheeze, no rhonchi, no rales  Cardiovascular: S1/S2 normal, no murmur, no rub/gallop auscultated. RRR.   Musculoskeletal: Gait normal. Symmetric and independent movement of all extremities  Neurological: Normal balance/coordination. No tremor.  Skin: warm, dry, intact.   Psychiatric: Normal judgment/insight. Normal mood and  affect. Oriented x3.     ASSESSMENT/PLAN:   Anxiety and depression - referral to behavioral health for counseling, can go up to maximum Wellbutrin 450 mg daily - Plan: Ambulatory referral to [place] [facility] with cough - printed list OTC medicines might be helpful  Bacterial sinusitis - Plan: azithromycin (ZITHROMAX) 250 MG tablet   Meds ordered this encounter  Medications  . buPROPion (WELLBUTRIN XL) 150 MG 24 hr tablet    Sig: Take 1 tablet (150 mg total) by mouth daily. Take w/ 300 mg for total dose 450 mg daily    Dispense:  90 tablet    Refill:  3  . azithromycin (ZITHROMAX) 250 MG tablet    Sig: Take two tabs at once on day 1, then one tab daily on days 2-5.    Dispense:  6 tablet    Refill:  0       Follow-up plan: Return if symptoms worsen or fail to improve, otherwise 3-4 mos for [facility] VISIT .  Visit summary  with medication list and pertinent instructions was printed for patient to review, alert [place] if any changes needed. All questions at time of visit were answered - patient instructed to contact office with any additional concerns. ER/RTC precautions were reviewed with the patient and understanding verbalized.     Please note: voice recognition software was used to produce this document, and typos may escape review. Please contact Dr. Sheppard Coil for any needed clarifications.

## 2017-05-15 ENCOUNTER — Encounter: Payer: Self-pay | Admitting: Physician Assistant

## 2017-05-15 ENCOUNTER — Ambulatory Visit (INDEPENDENT_AMBULATORY_CARE_PROVIDER_SITE_OTHER): Payer: Medicare PPO

## 2017-05-15 ENCOUNTER — Ambulatory Visit: Payer: Medicare PPO | Admitting: Physician Assistant

## 2017-05-15 VITALS — BP 153/92 | HR 97 | Temp 98.3°F | Wt 135.0 lb

## 2017-05-15 DIAGNOSIS — B9689 Other specified bacterial agents as the cause of diseases classified elsewhere: Secondary | ICD-10-CM | POA: Diagnosis not present

## 2017-05-15 DIAGNOSIS — R030 Elevated blood-pressure reading, without diagnosis of hypertension: Secondary | ICD-10-CM | POA: Insufficient documentation

## 2017-05-15 DIAGNOSIS — J019 Acute sinusitis, unspecified: Secondary | ICD-10-CM

## 2017-05-15 DIAGNOSIS — R05 Cough: Secondary | ICD-10-CM | POA: Diagnosis not present

## 2017-05-15 DIAGNOSIS — R110 Nausea: Secondary | ICD-10-CM | POA: Diagnosis not present

## 2017-05-15 DIAGNOSIS — R053 Chronic cough: Secondary | ICD-10-CM

## 2017-05-15 MED ORDER — PREDNISONE 20 MG PO TABS: 40.0000 mg | ORAL_TABLET | Freq: Every day | ORAL | 0 refills | Status: AC

## 2017-05-15 MED ORDER — DOXYCYCLINE HYCLATE 100 MG PO TABS: 100.0000 mg | ORAL_TABLET | Freq: Two times a day (BID) | ORAL | 0 refills | Status: AC

## 2017-05-15 MED ORDER — ONDANSETRON 4 MG PO TBDP: 4.0000 mg | ORAL_TABLET | Freq: Three times a day (TID) | ORAL | 0 refills | Status: DC | PRN

## 2017-05-15 NOTE — Progress Notes (Signed)
HPI:                                                                Veronica [name] is a 77 y.o. female who presents to [facility] today for sinusitis  Sinusitis  This is a new problem. The current episode started 1 to 4 weeks ago (x 3 weeks). The problem has been gradually worsening since onset. There has been no fever. The pain is moderate. Associated symptoms include congestion, coughing (productive of purulent sputum), shortness of breath and sinus pressure. Pertinent negatives include no neck pain or sore throat. Past treatments include antibiotics, oral decongestants and saline sprays. The treatment provided no relief.    Past Medical History:  Diagnosis Date  . Anemia   . Diabetic retinopathy ([place]) 03/23/2015  . GERD (gastroesophageal reflux disease) 01/05/2015  . Hypothyroid   . Osteopenia 03/03/2014  . Type 2 diabetes mellitus with microalbuminuria or microproteinuria 03/03/2014   Actos added January 2016.     Past Surgical History:  Procedure Laterality Date  . APPENDECTOMY  02/28/11  . CESAREAN SECTION    . LAPAROSCOPIC APPENDECTOMY  03/03/2011   Procedure: APPENDECTOMY LAPAROSCOPIC;  Surgeon: Pedro Earls, MD;  Location: [facility];  Service: General;  Laterality: N/A;  . TONSILLECTOMY     Social History   Tobacco Use  . Smoking status: Never Smoker  . Smokeless tobacco: Never Used  Substance Use Topics  . Alcohol use: No   family history includes Cancer in her father and mother; Diabetes in her mother; Hypertension in her mother; Stroke in her mother.    ROS: negative except as noted in the HPI  Medications: Current Outpatient Medications  Medication Sig Dispense Refill  . AMBULATORY NON FORMULARY MEDICATION Accucheck  Aviva lancets and test strips 100 each 11  . aspirin 81 MG tablet Take 1 tablet (81 mg total) by mouth daily. 30 tablet   . blood glucose meter kit and supplies KIT Dispense based on patient and  insurance preference. Use up to four times daily as directed. Please include lancets, test strips, control solution. 1 each 1  . buPROPion (WELLBUTRIN XL) 150 MG 24 hr tablet Take 1 tablet (150 mg total) by mouth daily. Take w/ 300 mg for total dose 450 mg daily 90 tablet 3  . buPROPion (WELLBUTRIN XL) 300 MG 24 hr tablet Take 1 tablet (300 mg total) every morning by mouth. 90 tablet 3  . ciclopirox (PENLAC) 8 % solution Apply topically at bedtime. Apply over nail and surrounding skin. Apply daily over previous coat. After seven (7) days, may remove with alcohol and continue cycle. 12 mL 5  . clonazePAM (KLONOPIN) 0.5 MG tablet Take 1 tablet (0.5 mg total) by mouth 3 (three) times daily as needed for anxiety. for anxiety. #90 for thirty days 90 tablet 2  . doxycycline (VIBRA-TABS) 100 MG tablet Take 1 tablet (100 mg total) by mouth 2 (two) times daily for 7 days. 14 tablet 0  . glucose blood test strip Use up to 4 times per day as directed with glucometer. Disp: 100. Refill x99 100 each 99  . insulin aspart (FIASP FLEXTOUCH) 100 UNIT/ML FlexPen Inject into the skin.    [name] insulin degludec (TRESIBA) 100  UNIT/ML SOPN FlexTouch Pen Inject into the skin.    [name] insulin NPH Human (HUMULIN N,NOVOLIN N) 100 UNIT/ML injection Inject into the skin.    [name] insulin regular (NOVOLIN R,HUMULIN R) 100 units/mL injection Inject into the skin.    [name] levothyroxine (SYNTHROID, LEVOTHROID) 88 MCG tablet Take 1 tablet (88 mcg total) by mouth daily. FOLLOWING WITH ENDOCRINOLOGY DR LEVY 1 tablet 0  . metFORMIN (GLUCOPHAGE-XR) 500 MG 24 hr tablet Take by mouth. FOLLOWING W/ ENDOCRINOLOGY DR. [place] Poli    . ondansetron (ZOFRAN-ODT) 4 MG disintegrating tablet Take 1 tablet (4 mg total) by mouth every 8 (eight) hours as needed for nausea or vomiting. 10 tablet 0  . predniSONE (DELTASONE) 20 MG tablet Take 2 tablets (40 mg total) by mouth daily with breakfast for 5 days. 10 tablet 0   No current facility-administered medications for this  visit.    Allergies  Allergen Reactions  . Dulaglutide Palpitations    SOB. N/V  . Cephalexin Other (See Comments)    hives hives  . Sitagliptin Other (See Comments)    Insomnia  . Codeine Hives and Nausea And Vomiting  . Ezetimibe   . Glimepiride Hives  . Guaifenesin   . Metformin And Related     diarrhea  . Pseudoephedrine Hcl   . Sulfa Antibiotics Hives and Nausea And Vomiting  . Penicillins Rash       Objective:  BP (!) 153/92   Pulse 97   Temp 98.3 F (36.8 C) (Oral)   Wt 135 lb (61.2 kg)   BMI 21.14 kg/m  Gen:  alert, not ill-appearing, no distress, appropriate for age 42: head normocephalic without obvious abnormality, conjunctiva and cornea clear, TM's pearly gray and semi-transparent, there is maxillary sinus tenderness, trachea midline Pulm: Normal work of breathing, normal phonation, clear to auscultation bilaterally, no wheezes, rales or rhonchi CV: Normal rate, regular rhythm, s1 and s2 distinct, no murmurs, clicks or rubs  Neuro: alert and oriented x 3, no tremor MSK: extremities atraumatic, normal gait and station Skin: intact, no rashes on exposed skin, no jaundice, no cyanosis   No results found for this or any previous visit (from the past 72 hour(s)). No results found.    Assessment and Plan: 77 y.o. female with   1. Acute bacterial sinusitis - persistent symptoms for >3 weeks, has been treated with Erythromycin. Allergic to PCN and Cephalosporins. Requesting oral steroid for facial pressure/ear pain. Counseled on monitoring glucose closely for elevations. - doxycycline (VIBRA-TABS) 100 MG tablet; Take 1 tablet (100 mg total) by mouth 2 (two) times daily for 7 days.  Dispense: 14 tablet; Refill: 0 - predniSONE (DELTASONE) 20 MG tablet; Take 2 tablets (40 mg total) by mouth daily with breakfast for 5 days.  Dispense: 10 tablet; Refill: 0  2. Persistent cough for 3 weeks or longer - DG Chest 2 View  3. Nausea - ondansetron (ZOFRAN-ODT) 4  MG disintegrating tablet; Take 1 tablet (4 mg total) by mouth every 8 (eight) hours as needed for nausea or vomiting.  Dispense: 10 tablet; Refill: 0  4. Elevated blood pressure reading BP Readings from Last 3 Encounters:  05/15/17 (!) 153/92  05/02/17 (!) 141/79  11/21/16 (!) 144/88  - patient denies history of HTN, reports elevated BP is due to oral decongestant. Recommend follow-up with PCP to discuss low-dose antihypertensive (ACE/ARB given diabetes)  Patient education and anticipatory guidance given Patient agrees with treatment plan Follow-up as needed if symptoms worsen or fail to improve  Darlyne Russian PA-C

## 2017-05-15 NOTE — Patient Instructions (Signed)
-   Chest x-ray today - Doxycycline twice a day for 7 days. It is common to have diarrhea and stomach upset with this antibiotic; it is not an allergic reaction. Take with food and take zofran as needed - Prednisone burst daily for 5 days - continue netti pot 3-4 times per day - Flonase 1 spray each nostril daily - AVOID oral decongestants like Pseudoephedrine, Phenylephrine

## 2017-05-16 NOTE — Progress Notes (Signed)
Chest x-ray does not show pneumonia Treatment plan does not change

## 2017-07-31 ENCOUNTER — Other Ambulatory Visit: Payer: Self-pay | Admitting: Osteopathic Medicine

## 2017-07-31 DIAGNOSIS — F419 Anxiety disorder, unspecified: Principal | ICD-10-CM

## 2017-07-31 DIAGNOSIS — F32A Depression, unspecified: Secondary | ICD-10-CM

## 2017-07-31 DIAGNOSIS — F329 Major depressive disorder, single episode, unspecified: Secondary | ICD-10-CM

## 2017-07-31 NOTE — Telephone Encounter (Signed)
Pt has been updated.  

## 2017-07-31 NOTE — Telephone Encounter (Signed)
[facility] pharmacy requesting med RF for clonazepam.

## 2017-07-31 NOTE — Telephone Encounter (Signed)
Sent!

## 2017-08-07 ENCOUNTER — Telehealth: Payer: Self-pay

## 2017-08-07 NOTE — Telephone Encounter (Signed)
Nope, sounds good Will see her at her visit!

## 2017-08-07 NOTE — Telephone Encounter (Signed)
Called pt to reschedule appt to sooner. No answer and no VM picked up, unable to leave msg. Will attempt to call again later

## 2017-08-07 NOTE — Telephone Encounter (Signed)
Spoke to pt- cancelled July appt and scheduled a follow up this week, 08-10-17, at 11:30.

## 2017-08-07 NOTE — Telephone Encounter (Signed)
Pt left VM stating that she was changed from Celexa to Wellbutrin by Dr Lyn HollingsheadAlexander and was having some issues with feeling lightheaded when she stood up like her BP was dropping and also nauseated.  Pt decreased herself down to taking Wellbutrin just QOD and states she felt better, so she ultimately stopped Wellbutrin overall.   Been off of meds for 2 weeks now and states she feels so much better and does not have any depression. Reports that all she takes now is Clonazepam.  Pt's next f/u is scheduled for 08-31-17 with Dr Lyn HollingsheadAlexander.   Note to Dr Lyn HollingsheadAlexander- I am going to call pt and move her follow up to sometime this week. Please let me know if you need me to do anything further.  Please advise

## 2017-08-10 ENCOUNTER — Ambulatory Visit: Payer: Medicare PPO | Admitting: Osteopathic Medicine

## 2017-08-10 ENCOUNTER — Encounter: Payer: Self-pay | Admitting: Osteopathic Medicine

## 2017-08-10 VITALS — BP 137/86 | HR 76 | Temp 97.9°F | Wt 137.4 lb

## 2017-08-10 DIAGNOSIS — F32A Depression, unspecified: Secondary | ICD-10-CM

## 2017-08-10 DIAGNOSIS — F419 Anxiety disorder, unspecified: Secondary | ICD-10-CM | POA: Diagnosis not present

## 2017-08-10 DIAGNOSIS — K219 Gastro-esophageal reflux disease without esophagitis: Secondary | ICD-10-CM | POA: Diagnosis not present

## 2017-08-10 DIAGNOSIS — F329 Major depressive disorder, single episode, unspecified: Secondary | ICD-10-CM

## 2017-08-10 MED ORDER — RANITIDINE HCL 300 MG PO CAPS: 300.0000 mg | ORAL_CAPSULE | Freq: Every evening | ORAL | 3 refills | Status: DC

## 2017-08-10 NOTE — Patient Instructions (Signed)
Glad to hear you're doing well!  If you notice any mood changes or problems with depression, please come see me

## 2017-08-10 NOTE — Progress Notes (Signed)
HPI: Veronica [name] is a 77 y.o. female who  has a past medical history of Anemia, Diabetic retinopathy ([place]) (03/23/2015), GERD (gastroesophageal reflux disease) (01/05/2015), Hypothyroid, Osteopenia (03/03/2014), and Type 2 diabetes mellitus with microalbuminuria or microproteinuria (03/03/2014).  she presents to [facility] today, 08/10/17,  for chief complaint of:  Follow up anxiety/depression  From note 08/07/17: "Pt left VM stating that she was changed from Celexa to Wellbutrin by Dr Sheppard Coil and was having some issues with feeling lightheaded when she stood up like her BP was dropping and also nauseated. Pt decreased herself down to taking Wellbutrin just QOD and states she felt better, so she ultimately stopped Wellbutrin overall.  Been off of meds for 2 weeks now and states she feels so much better and does not have any depression. Reports that all she takes now is Clonazepam."  Reports her moods are great, no concerns for depression, she is sleeping well, no plans for traveling for awhile.   Synthroid, Novolog, Tyler Aas and Clonazepam are her only Rx meds at this point. Occasional ibuprofen and she started Ranitidine and Melatonin OTC.     Past medical history, surgical history, and family history reviewed.  Current medication list and allergy/intolerance information reviewed.   (See remainder of HPI, ROS, Phys Exam below)    ASSESSMENT/PLAN:   Anxiety and depression - Long-standing benzodiazepine dependence, okay to continue as long as no adverse events.   Gastroesophageal reflux disease without esophagitis - Okay to continue ranitidine, prescription sent.   Meds ordered this encounter  Medications  . ranitidine (ZANTAC) 300 MG capsule    Sig: Take 1 capsule (300 mg total) by mouth every evening.    Dispense:  90 capsule    Refill:  3    Tabs ok if cheaper    Patient Instructions  Glad to hear you're doing well!  If you notice any  mood changes or problems with depression, please come see me    Follow-up plan: Return in about 3 months (around 11/10/2017) for recheck anxiety, refill medications - see me sooner if needed .     ############################################ ############################################ ############################################ ############################################    Outpatient Encounter Medications as of 08/10/2017  Medication Sig Note  . clonazePAM (KLONOPIN) 0.5 MG tablet Take 1 tablet (0.5 mg total) by mouth 3 (three) times daily as needed (#90 for thirty days). for anxiety   . insulin degludec (TRESIBA) 100 UNIT/ML SOPN FlexTouch Pen Inject into the skin.   [name] insulin regular (NOVOLIN R,HUMULIN R) 100 units/mL injection Inject into the skin.   [name] levothyroxine (SYNTHROID, LEVOTHROID) 88 MCG tablet Take 1 tablet (88 mcg total) by mouth daily. FOLLOWING WITH ENDOCRINOLOGY DR LEVY   . insulin aspart (FIASP FLEXTOUCH) 100 UNIT/ML FlexPen Inject into the skin.   [name] insulin NPH Human (HUMULIN N,NOVOLIN N) 100 UNIT/ML injection Inject into the skin.   . metFORMIN (GLUCOPHAGE-XR) 500 MG 24 hr tablet Take by mouth. FOLLOWING W/ ENDOCRINOLOGY DR. Hartford Poli 12/28/2015: Received from: [facility]: Take one tablet (500 mg total) by mouth 2 (two) times daily with meals.  . ranitidine (ZANTAC) 300 MG capsule Take 1 capsule (300 mg total) by mouth every evening.   . [DISCONTINUED] AMBULATORY NON FORMULARY MEDICATION Accucheck  Aviva lancets and test strips (Patient not taking: Reported on 08/10/2017)   . [DISCONTINUED] aspirin 81 MG tablet Take 1 tablet (81 mg total) by mouth daily. (Patient not taking: Reported on 08/10/2017)   . [DISCONTINUED] blood glucose meter kit  and supplies KIT Dispense based on patient and insurance preference. Use up to four times daily as directed. Please include lancets, test strips, control solution. (Patient not taking: Reported on 08/10/2017)   .  [DISCONTINUED] buPROPion (WELLBUTRIN XL) 150 MG 24 hr tablet Take 1 tablet (150 mg total) by mouth daily. Take w/ 300 mg for total dose 450 mg daily   . [DISCONTINUED] buPROPion (WELLBUTRIN XL) 300 MG 24 hr tablet Take 1 tablet (300 mg total) every morning by mouth.   . [DISCONTINUED] ciclopirox (PENLAC) 8 % solution Apply topically at bedtime. Apply over nail and surrounding skin. Apply daily over previous coat. After seven (7) days, may remove with alcohol and continue cycle. (Patient not taking: Reported on 08/10/2017)   . [DISCONTINUED] glucose blood test strip Use up to 4 times per day as directed with glucometer. Disp: 100. Refill x99 (Patient not taking: Reported on 08/10/2017)   . [DISCONTINUED] ondansetron (ZOFRAN-ODT) 4 MG disintegrating tablet Take 1 tablet (4 mg total) by mouth every 8 (eight) hours as needed for nausea or vomiting.    No facility-administered encounter medications on file as of 08/10/2017.    Allergies  Allergen Reactions  . Dulaglutide Palpitations    SOB. N/V  . Cephalexin Other (See Comments)    hives hives  . Sitagliptin Other (See Comments)    Insomnia  . Codeine Hives and Nausea And Vomiting  . Ezetimibe   . Glimepiride Hives  . Guaifenesin   . Metformin And Related     diarrhea  . Pseudoephedrine Hcl   . Sulfa Antibiotics Hives and Nausea And Vomiting  . Penicillins Rash      Review of Systems:  Constitutional: No recent illness  HEENT: No  headache, no vision change  Cardiac: No  chest pain, No  pressure, No palpitations  Respiratory:  No  shortness of breath. No  Cough  Hem/Onc: No  easy bruising/bleeding  Neurologic: No  weakness, No  Dizziness  Psychiatric: No  concerns with depression, No  concerns with anxiety  Exam:  BP 137/86 (BP Location: Left Arm, Patient Position: Sitting, Cuff Size: Normal)   Pulse 76   Temp 97.9 F (36.6 C) (Oral)   Wt 137 lb 6.4 oz (62.3 kg)   BMI 21.52 kg/m   Constitutional: VS see above. General  Appearance: alert, well-developed, well-nourished, NAD  Eyes: Normal lids and conjunctive, non-icteric sclera  Ears, Nose, Mouth, Throat: MMM, Normal external inspection ears/nares/mouth/lips/gums.  Neck: No masses, trachea midline.   Respiratory: Normal respiratory effort. no wheeze, no rhonchi, no rales  Cardiovascular: S1/S2 normal, no murmur, no rub/gallop auscultated. RRR.   Musculoskeletal: Gait normal. Symmetric and independent movement of all extremities  Neurological: Normal balance/coordination. No tremor.  Skin: warm, dry, intact.   Psychiatric: Normal judgment/insight. Normal mood and affect. Oriented x3.   Visit summary with medication list and pertinent instructions was printed for patient to review, advised to alert [place] if any changes needed. All questions at time of visit were answered - patient instructed to contact office with any additional concerns. ER/RTC precautions were reviewed with the patient and understanding verbalized.   Follow-up plan: Return in about 3 months (around 11/10/2017) for recheck anxiety, refill medications - see me sooner if needed .  Note: Total time spent 15 minutes, greater than 50% of the visit was spent face-to-face counseling and coordinating care for the following: The primary encounter diagnosis was Anxiety and depression. A diagnosis of Gastroesophageal reflux disease without esophagitis was also pertinent  to this visit.[name]  Please note: voice recognition software was used to produce this document, and typos may escape review. Please contact Dr. Sheppard Coil for any needed clarifications.

## 2017-08-11 ENCOUNTER — Encounter: Payer: Self-pay | Admitting: Osteopathic Medicine

## 2017-08-31 ENCOUNTER — Ambulatory Visit: Payer: Self-pay | Admitting: Osteopathic Medicine

## 2017-09-26 ENCOUNTER — Ambulatory Visit: Payer: Medicare PPO | Admitting: Osteopathic Medicine

## 2017-09-26 ENCOUNTER — Encounter: Payer: Self-pay | Admitting: Osteopathic Medicine

## 2017-09-26 VITALS — BP 138/90 | HR 84 | Temp 98.0°F | Wt 133.5 lb

## 2017-09-26 DIAGNOSIS — F329 Major depressive disorder, single episode, unspecified: Secondary | ICD-10-CM

## 2017-09-26 DIAGNOSIS — F419 Anxiety disorder, unspecified: Secondary | ICD-10-CM | POA: Diagnosis not present

## 2017-09-26 DIAGNOSIS — S39012A Strain of muscle, fascia and tendon of lower back, initial encounter: Secondary | ICD-10-CM | POA: Diagnosis not present

## 2017-09-26 DIAGNOSIS — F32A Depression, unspecified: Secondary | ICD-10-CM

## 2017-09-26 DIAGNOSIS — J309 Allergic rhinitis, unspecified: Secondary | ICD-10-CM

## 2017-09-26 MED ORDER — CITALOPRAM HYDROBROMIDE 40 MG PO TABS: 40.0000 mg | ORAL_TABLET | Freq: Every day | ORAL | 0 refills | Status: DC

## 2017-09-26 MED ORDER — BUPROPION HCL ER (XL) 150 MG PO TB24: ORAL_TABLET | ORAL | 0 refills | Status: DC

## 2017-09-26 MED ORDER — CYCLOBENZAPRINE HCL 10 MG PO TABS: 5.0000 mg | ORAL_TABLET | Freq: Three times a day (TID) | ORAL | 1 refills | Status: DC | PRN

## 2017-09-26 NOTE — Patient Instructions (Signed)
[facility] [place] [facility] [address] Will restart medications. Please call me if needed!   If any mental health crisis or suicidal thoughts, please see [facility] ([address], [place], [place] [address], [phone]) OR [facility] ([address], [place], [place] [address], [phone]) for immediate mental health services.

## 2017-09-26 NOTE — Progress Notes (Signed)
HPI: Veronica [name] is a 77 y.o. female who  has a past medical history of Anemia, Diabetic retinopathy (HCC) (03/23/2015), GERD (gastroesophageal reflux disease) (01/05/2015), Hypothyroid, Osteopenia (03/03/2014), and Type 2 diabetes mellitus with microalbuminuria or microproteinuria (03/03/2014).  [facility] today, 09/26/17,  for chief complaint of:  Follow up anxiety/depression  From note 08/07/17: "Pt left VM stating that she was changed from Celexa to Wellbutrin by Dr Lyn HollingsheadAlexander and was having some issues with feeling lightheaded when she stood up like her BP was dropping and also nauseated. Pt decreased herself down to taking Wellbutrin just QOD and states she felt better, so she ultimately stopped Wellbutrin overall.   Was off of meds for 2 weeks at time of last visit 08/10/17, and states she feels so much better and does not have any depression.  However, at this time, she feels that she has slipped backwards, "feels like I am in a black hole" no suicidal ideation or attempts.  Also reports recent lower back strain, tripped over her dog yesterday and requests refill of cyclobenzaprine.  Sinuses have been bothering her a bit more as well, ear fullness.  No coughing, no fever, no breast.  Allergies have been bothering her a bit more than usual.      Past medical history, surgical history, and family history reviewed.  Current medication list and allergy/intolerance information reviewed.   (See remainder of HPI, ROS, Phys Exam below)    ASSESSMENT/PLAN: The primary encounter diagnosis was Anxiety and depression. Diagnoses of Allergic rhinitis, unspecified seasonality, unspecified trigger and Strain of muscle, fascia and tendon of lower back, initial encounter were also pertinent to this visit.  Meds ordered this encounter  Medications  . citalopram (CELEXA) 40 MG tablet    Sig: Take 1 tablet (40 mg total) by mouth daily.   Dispense:  90 tablet    Refill:  0  . buPROPion (WELLBUTRIN XL) 150 MG 24 hr tablet    Sig: Take 1 tablet (150 mg total) by mouth every morning for 7 days, THEN 2 tablets (300 mg total) every morning for 23 days.    Dispense:  53 tablet    Refill:  0  . cyclobenzaprine (FLEXERIL) 10 MG tablet    Sig: Take 0.5-1 tablets (5-10 mg total) by mouth 3 (three) times daily as needed for muscle spasms. Caution: can cause drowsiness    Dispense:  60 tablet    Refill:  1     Patient Instructions  Will send referral for [place] [facility] therapy to [facility] location on [address] Will restart medications. Please call me if needed!   If any mental health crisis or suicidal thoughts, please see [facility] ([address], [place], [place] [address], [phone]) OR [facility] ([address], [place], [place] [address], [phone]) for immediate mental health services.     Follow-up plan: Return in about 1 month (around 10/24/2017) for recheck mental health, sooner if needed.     ############################################ ############################################ ############################################ ############################################    Outpatient Encounter Medications as of 09/26/2017  Medication Sig Note  . clonazePAM (KLONOPIN) 0.5 MG tablet Take 1 tablet (0.5 mg total) by mouth 3 (three) times daily as needed (#90 for thirty days). for anxiety   . insulin aspart (FIASP FLEXTOUCH) 100 UNIT/ML FlexPen Inject into the skin.   [name]. insulin degludec (TRESIBA) 100 UNIT/ML SOPN FlexTouch Pen Inject into the skin.   [name]. levothyroxine (SYNTHROID, LEVOTHROID) 88  MCG tablet Take 1 tablet (88 mcg total) by mouth daily. FOLLOWING WITH ENDOCRINOLOGY DR LEVY   . metFORMIN (GLUCOPHAGE-XR) 500 MG 24 hr tablet Take by mouth. FOLLOWING W/ ENDOCRINOLOGY DR. Shawnee Knapp 12/28/2015: Received from: [facility] Received Sig: Take one tablet (500 mg total) by  mouth 2 (two) times daily with meals.  . ranitidine (ZANTAC) 300 MG capsule Take 1 capsule (300 mg total) by mouth every evening.   . insulin NPH Human (HUMULIN N,NOVOLIN N) 100 UNIT/ML injection Inject into the skin.   [name] insulin regular (NOVOLIN R,HUMULIN R) 100 units/mL injection Inject into the skin.    No facility-administered encounter medications on file as of 09/26/2017.    Allergies  Allergen Reactions  . Dulaglutide Palpitations    SOB. N/V  . Cephalexin Other (See Comments)    hives   . Codeine Hives and Nausea And Vomiting  . Glimepiride Hives  . Penicillins Rash  . Sitagliptin Other (See Comments)    Insomnia   . Sulfamethoxazole-Trimethoprim Hives  . Ezetimibe   . Guaifenesin   . Metformin And Related     diarrhea  . Pseudoephedrine Hcl   . Sulfa Antibiotics Hives and Nausea And Vomiting      Review of Systems:  Constitutional: No recent illness  HEENT: No  headache, no vision change, (+)sinus pressure   Cardiac: No  chest pain, No  pressure, No palpitations  Respiratory:  No  shortness of breath. No  Cough  Hem/Onc: No  easy bruising/bleeding  Neurologic: No  weakness, No  Dizziness  Psychiatric: +concerns with depression, +concerns with anxiety  Exam:  BP 138/90 (BP Location: Left Arm, Patient Position: Sitting, Cuff Size: Normal)   Pulse 84   Temp 98 F (36.7 C) (Oral)   Wt 133 lb 8 oz (60.6 kg)   BMI 20.91 kg/m   Constitutional: VS see above. General Appearance: alert, well-developed, well-nourished, NAD  Eyes: Normal lids and conjunctive, non-icteric sclera  Ears, Nose, Mouth, Throat: MMM, Normal external inspection ears/nares/mouth/lips/gums.  Neck: No masses, trachea midline.   Respiratory: Normal respiratory effort. no wheeze, no rhonchi, no rales  Cardiovascular: S1/S2 normal, no murmur, no rub/gallop auscultated. RRR.   Musculoskeletal: Gait normal. Symmetric and independent movement of all extremities  Neurological: Normal  balance/coordination. No tremor.  Skin: warm, dry, intact.   Psychiatric: Normal judgment/insight. Normal mood and affect. Oriented x3.   Visit summary with medication list and pertinent instructions was printed for patient to review, advised to alert [place] if any changes needed. All questions at time of visit were answered - patient instructed to contact office with any additional concerns. ER/RTC precautions were reviewed with the patient and understanding verbalized.   Follow-up plan: Return in about 1 month (around 10/24/2017) for recheck mental health, sooner if needed.  Note: Total time spent 25 minutes, greater than 50% of the visit was spent face-to-face counseling and coordinating care for the following: The primary encounter diagnosis was Anxiety and depression. Diagnoses of Allergic rhinitis, unspecified seasonality, unspecified trigger and Strain of muscle, fascia and tendon of lower back, initial encounter were also pertinent to this visit.[name]  Please note: voice recognition software was used to produce this document, and typos may escape review. Please contact Dr. Lyn Hollingshead for any needed clarifications.

## 2017-10-24 ENCOUNTER — Ambulatory Visit: Payer: Medicare PPO | Admitting: Osteopathic Medicine

## 2017-10-24 ENCOUNTER — Encounter: Payer: Self-pay | Admitting: Osteopathic Medicine

## 2017-10-24 VITALS — BP 148/87 | HR 74 | Temp 98.1°F | Wt 133.4 lb

## 2017-10-24 DIAGNOSIS — F329 Major depressive disorder, single episode, unspecified: Secondary | ICD-10-CM | POA: Diagnosis not present

## 2017-10-24 DIAGNOSIS — Z23 Encounter for immunization: Secondary | ICD-10-CM

## 2017-10-24 DIAGNOSIS — F419 Anxiety disorder, unspecified: Secondary | ICD-10-CM | POA: Diagnosis not present

## 2017-10-24 DIAGNOSIS — F32A Depression, unspecified: Secondary | ICD-10-CM

## 2017-10-24 MED ORDER — CITALOPRAM HYDROBROMIDE 40 MG PO TABS: 40.0000 mg | ORAL_TABLET | Freq: Every day | ORAL | 3 refills | Status: DC

## 2017-10-24 MED ORDER — BUPROPION HCL ER (XL) 300 MG PO TB24: 300.0000 mg | ORAL_TABLET | ORAL | 3 refills | Status: DC

## 2017-10-24 MED ORDER — CITALOPRAM HYDROBROMIDE 20 MG PO TABS: 20.0000 mg | ORAL_TABLET | Freq: Every day | ORAL | 3 refills | Status: DC

## 2017-10-24 MED ORDER — CLONAZEPAM 0.5 MG PO TABS: 0.5000 mg | ORAL_TABLET | Freq: Three times a day (TID) | ORAL | 2 refills | Status: DC | PRN

## 2017-10-24 NOTE — Progress Notes (Signed)
HPI: Veronica [name] is a 77 y.o. female who  has a past medical history of Anemia, Diabetic retinopathy (HCC) (03/23/2015), GERD (gastroesophageal reflux disease) (01/05/2015), Hypothyroid, Osteopenia (03/03/2014), and Type 2 diabetes mellitus with microalbuminuria or microproteinuria (03/03/2014).  she presents to [facility] today, 10/24/17,  for chief complaint of:  Recheck Mental Health   Doing really well on current medications, back on Celexa and Wellbutrin. She had a consultation with the magentism folks, was going to be too expensive and she feels fine on the medications.   Following with endocrine for thyroid and sugars.     Past medical history, surgical history, and family history reviewed.  Current medication list and allergy/intolerance information reviewed.   (See remainder of HPI, ROS, Phys Exam below)    ASSESSMENT/PLAN:   Anxiety and depression - Plan: buPROPion (WELLBUTRIN XL) 300 MG 24 hr tablet, citalopram (CELEXA) 40 MG tablet, clonazePAM (KLONOPIN) 0.5 MG tablet  Need for influenza vaccination - Plan: Flu vaccine HIGH DOSE PF (Fluzone High dose)   Meds ordered this encounter  Medications  . buPROPion (WELLBUTRIN XL) 300 MG 24 hr tablet    Sig: Take 1 tablet (300 mg total) by mouth every morning.    Dispense:  90 tablet    Refill:  3  . citalopram (CELEXA) 40 MG tablet    Sig: Take 1 tablet (40 mg total) by mouth daily.    Dispense:  90 tablet    Refill:  3    Takes w/ the 20 mg for 60 mg daily total  . citalopram (CELEXA) 20 MG tablet    Sig: Take 1 tablet (20 mg total) by mouth daily.    Dispense:  90 tablet    Refill:  3    Takes w/ the 40 mg for 60 mg daily total  . clonazePAM (KLONOPIN) 0.5 MG tablet    Sig: Take 1 tablet (0.5 mg total) by mouth 3 (three) times daily as needed (#90 for thirty days). for anxiety    Dispense:  90 tablet    Refill:  2     Follow-up plan: Return in about 6 months (around 04/24/2018)  for ANNUAL PHYSICAL, ok to refill meds until then, otherwise see me as needed! .                  ############################################ ############################################ ############################################ ############################################    Outpatient Encounter Medications as of 10/24/2017  Medication Sig Note  . buPROPion (WELLBUTRIN XL) 150 MG 24 hr tablet Take 1 tablet (150 mg total) by mouth every morning for 7 days, THEN 2 tablets (300 mg total) every morning for 23 days.   . citalopram (CELEXA) 40 MG tablet Take 1 tablet (40 mg total) by mouth daily.   . clonazePAM (KLONOPIN) 0.5 MG tablet Take 1 tablet (0.5 mg total) by mouth 3 (three) times daily as needed (#90 for thirty days). for anxiety   . cyclobenzaprine (FLEXERIL) 10 MG tablet Take 0.5-1 tablets (5-10 mg total) by mouth 3 (three) times daily as needed for muscle spasms. Caution: can cause drowsiness   . insulin aspart (FIASP FLEXTOUCH) 100 UNIT/ML FlexPen Inject into the skin.   [name] insulin degludec (TRESIBA) 100 UNIT/ML SOPN FlexTouch Pen Inject into the skin.   [name] insulin NPH Human (HUMULIN N,NOVOLIN N) 100 UNIT/ML injection Inject into the skin.   [name] insulin regular (NOVOLIN R,HUMULIN R) 100 units/mL injection Inject into the skin.   [name] levothyroxine (SYNTHROID, LEVOTHROID) 88 MCG tablet Take  1 tablet (88 mcg total) by mouth daily. FOLLOWING WITH ENDOCRINOLOGY DR LEVY   . metFORMIN (GLUCOPHAGE-XR) 500 MG 24 hr tablet Take by mouth. FOLLOWING W/ ENDOCRINOLOGY DR. Shawnee Knapp 12/28/2015: Received from: [facility] Received Sig: Take one tablet (500 mg total) by mouth 2 (two) times daily with meals.  . ranitidine (ZANTAC) 300 MG capsule Take 1 capsule (300 mg total) by mouth every evening.    No facility-administered encounter medications on file as of 10/24/2017.    Allergies  Allergen Reactions  . Dulaglutide Palpitations    SOB. N/V  . Cephalexin Other (See Comments)    hives    . Codeine Hives and Nausea And Vomiting  . Glimepiride Hives  . Penicillins Rash  . Sitagliptin Other (See Comments)    Insomnia   . Sulfamethoxazole-Trimethoprim Hives  . Ezetimibe   . Guaifenesin   . Metformin And Related     diarrhea  . Pseudoephedrine Hcl   . Sulfa Antibiotics Hives and Nausea And Vomiting      Review of Systems:  Constitutional: No recent illness  HEENT: No  headache, no vision change  Cardiac: No  chest pain, No  pressure, No palpitations  Respiratory:  No  shortness of breath. No  Cough  Gastrointestinal: No  abdominal pain, no change on bowel habits  Psychiatric: No  concerns with depression, No  concerns with anxiety  Exam:  BP (!) 148/87 (BP Location: Left Arm, Patient Position: Sitting, Cuff Size: Normal)   Pulse 74   Temp 98.1 F (36.7 C) (Oral)   Wt 133 lb 6.4 oz (60.5 kg)   BMI 20.89 kg/m   Constitutional: VS see above. General Appearance: alert, well-developed, well-nourished, NAD  Eyes: Normal lids and conjunctive, non-icteric sclera  Ears, Nose, Mouth, Throat: MMM, Normal external inspection ears/nares/mouth/lips/gums.  Neck: No masses, trachea midline.   Respiratory: Normal respiratory effort.   Musculoskeletal: Gait normal. Symmetric and independent movement of all extremities  Neurological: Normal balance/coordination. No tremor.  Skin: warm, dry, intact.   Psychiatric: Normal judgment/insight. Normal mood and affect. Oriented x3.   Visit summary with medication list and pertinent instructions was printed for patient to review, advised to alert [place] if any changes needed. All questions at time of visit were answered - patient instructed to contact office with any additional concerns. ER/RTC precautions were reviewed with the patient and understanding verbalized.   Follow-up plan: Return in about 6 months (around 04/24/2018) for ANNUAL PHYSICAL, ok to refill meds until then, otherwise see me as needed! . See [place] in 2 weeks  for [place] bp recheck   Note: Total time spent 15 minutes, greater than 50% of the visit was spent face-to-face counseling and coordinating care for the following: The primary encounter diagnosis was Anxiety and depression. A diagnosis of Need for influenza vaccination was also pertinent to this visit.[name]  Please note: voice recognition software was used to produce this document, and typos may escape review. Please contact Dr. Lyn Hollingshead for any needed clarifications.

## 2017-10-27 ENCOUNTER — Telehealth: Payer: Self-pay

## 2017-10-27 NOTE — Telephone Encounter (Signed)
Veronica [name] called and states the Bupropion 300 mg is upsetting her stomach. She would like to take 150 mg twice daily instead. Please advise.

## 2017-10-30 MED ORDER — BUPROPION HCL ER (SR) 150 MG PO TB12: 150.0000 mg | ORAL_TABLET | Freq: Two times a day (BID) | ORAL | 1 refills | Status: DC

## 2017-10-30 NOTE — Telephone Encounter (Signed)
I sent dose of 150 mg to take twice daily, should be waiting for her at [facility]

## 2017-10-30 NOTE — Telephone Encounter (Signed)
Tried numerous times to contact pt. Call is "answered", but no one speaks on pt's end after several "hellos". Unable to leave a vm msg for pt. Will attempt to contact pt later on today.

## 2017-11-07 ENCOUNTER — Ambulatory Visit (INDEPENDENT_AMBULATORY_CARE_PROVIDER_SITE_OTHER): Payer: Medicare PPO | Admitting: Osteopathic Medicine

## 2017-11-07 VITALS — BP 158/88 | HR 91

## 2017-11-07 DIAGNOSIS — R030 Elevated blood-pressure reading, without diagnosis of hypertension: Secondary | ICD-10-CM

## 2017-11-07 NOTE — Progress Notes (Signed)
   Subjective:    Patient ID: Veronica [name], female    DOB: 08/19/1940, 77 y.o.   MRN: [id number]  HPI  Veronica [name] is here for blood pressure check. She did bring her home blood pressure monitor. It has checked out to be accurate.    Home monitor readings from the most recent.  127/66 122/74 122/69 131/74 138/84 121/62 128/65 139/79 132/75 143/78 147/77 133/71 126/80 136/85 149/86 114/62 116/72 122/67 116/71 118/71 128/73 117/72 134/78 129/74 136/87  Review of Systems     Objective:   Physical Exam        Assessment & Plan:  Hypertension - See Dr Mardelle MatteAlexander's note

## 2017-11-07 NOTE — Patient Instructions (Signed)
Continue to check blood pressure readings at home.

## 2017-11-07 NOTE — Progress Notes (Signed)
Home monitor is accurate, she it taking meds as directed.  Occasional home blood pressure measurements systolic into the 140s during stress, but otherwise typically in the 110s to 120s, and diastolic is 70s to 80s.  I am okay to leave medications as they are

## 2017-11-10 ENCOUNTER — Ambulatory Visit: Payer: Medicare PPO | Admitting: Osteopathic Medicine

## 2017-12-20 IMAGING — DX DG LUMBAR SPINE COMPLETE 4+V
5 series · 5 of 5 positions shown · non-contrast
Comparison: Coronal and sagittal CT images from a scan of March 02, 2011

CLINICAL DATA: Low back pain and right lower extremity radicular
symptoms for the past 4 months. No known injury.

EXAM:
LUMBAR SPINE - COMPLETE 4+ VIEW

[l-spine ap]
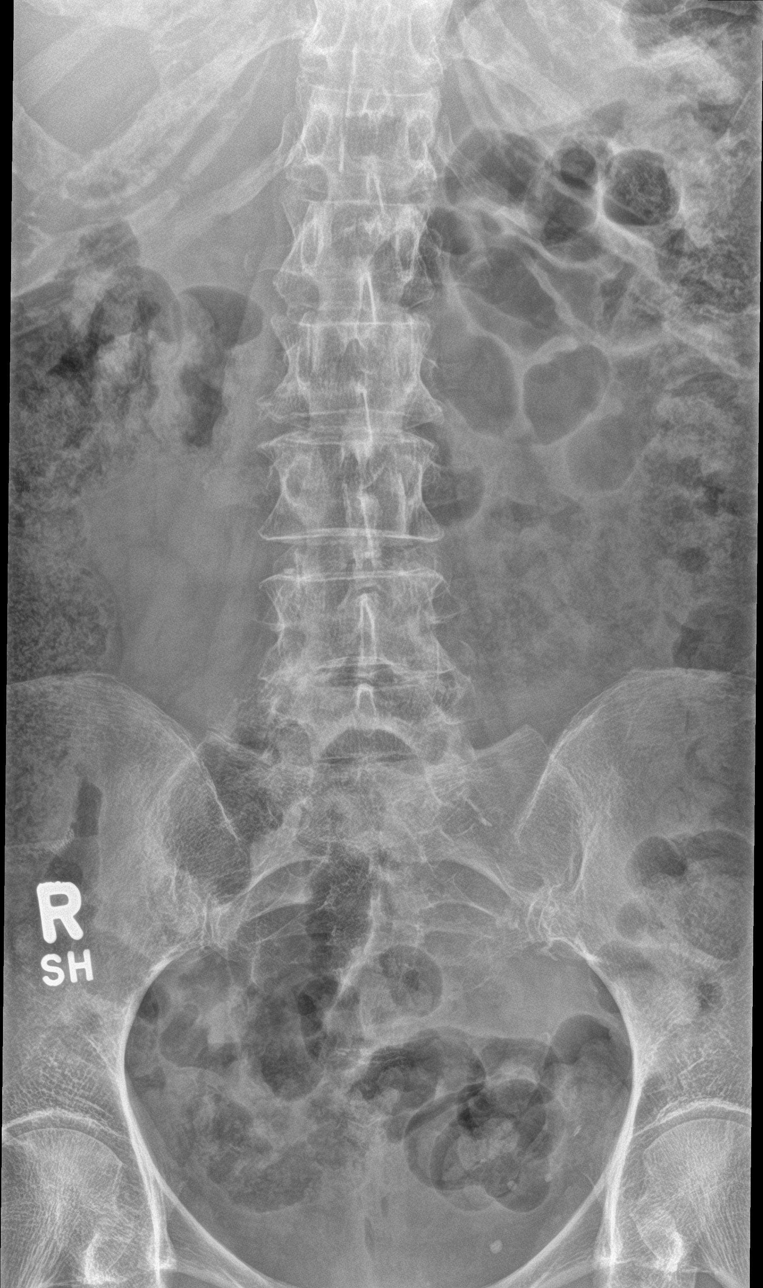

[l-spine obl (1 of 2)]
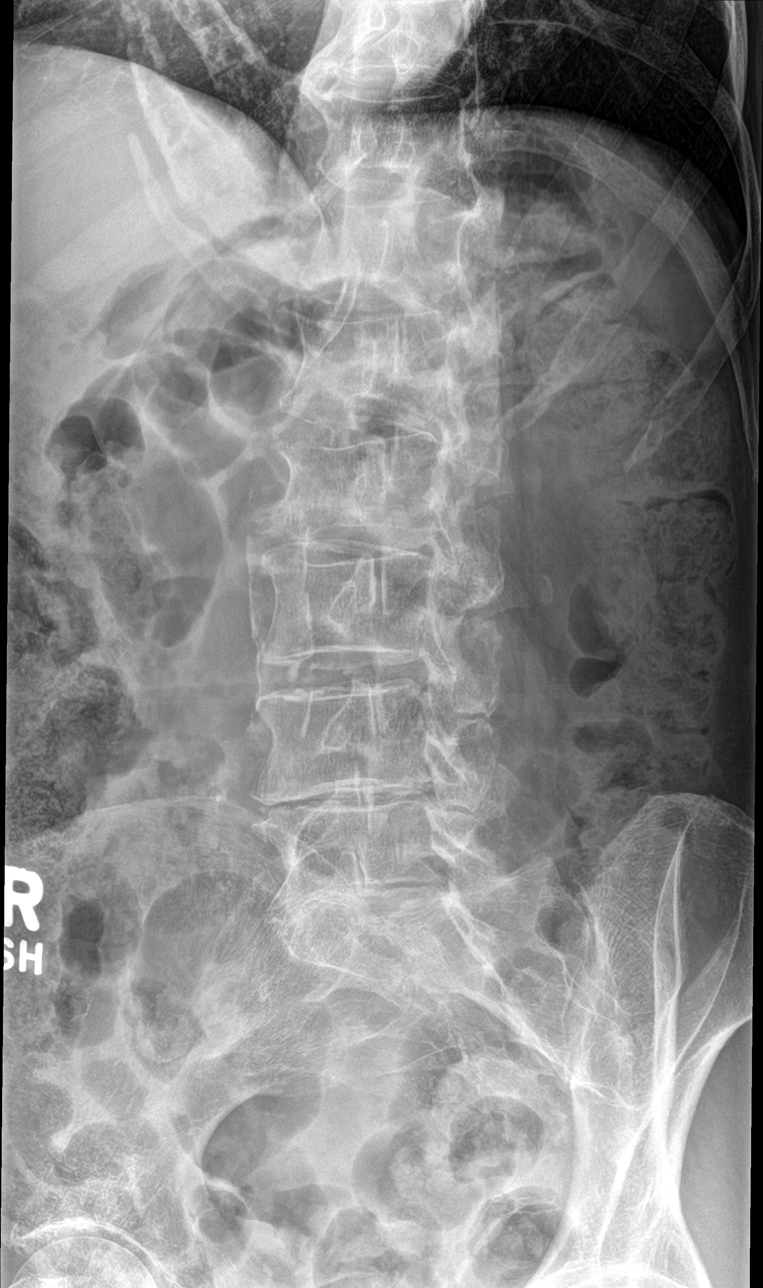

[l-spine obl (2 of 2)]
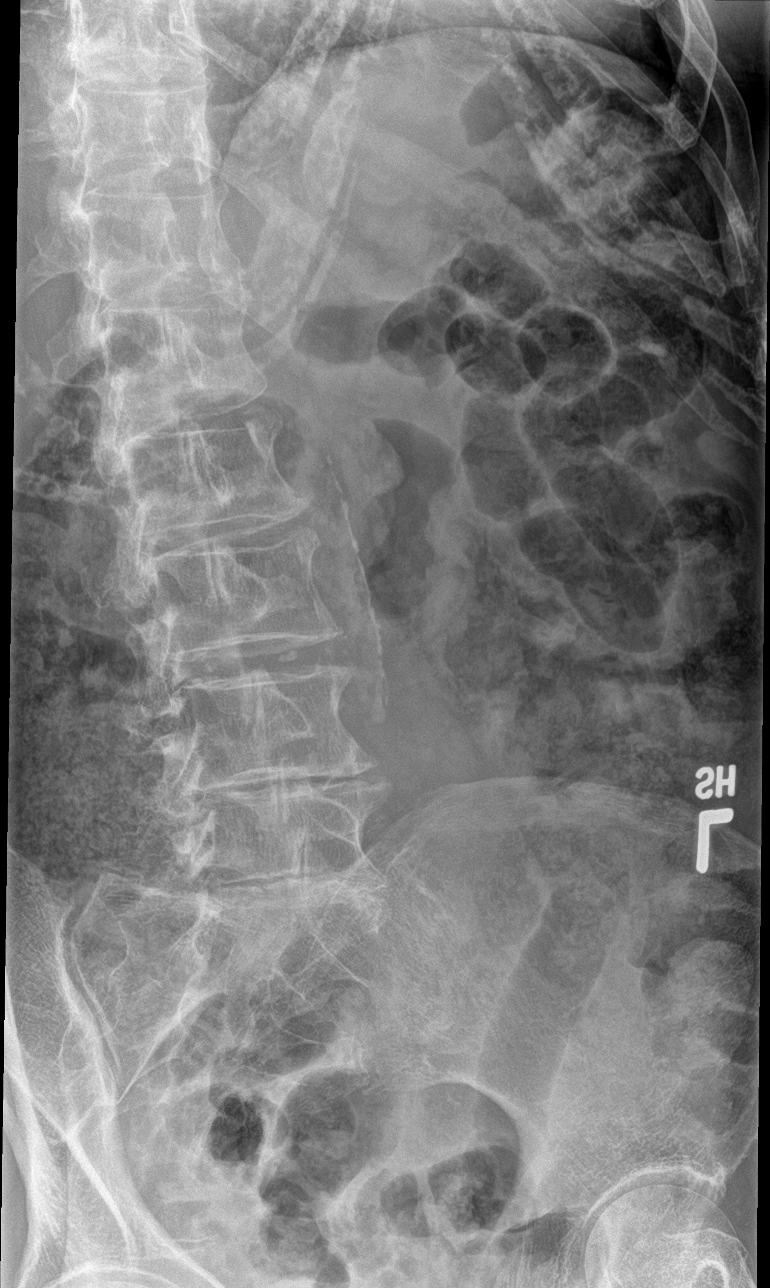

[l-spine lat]
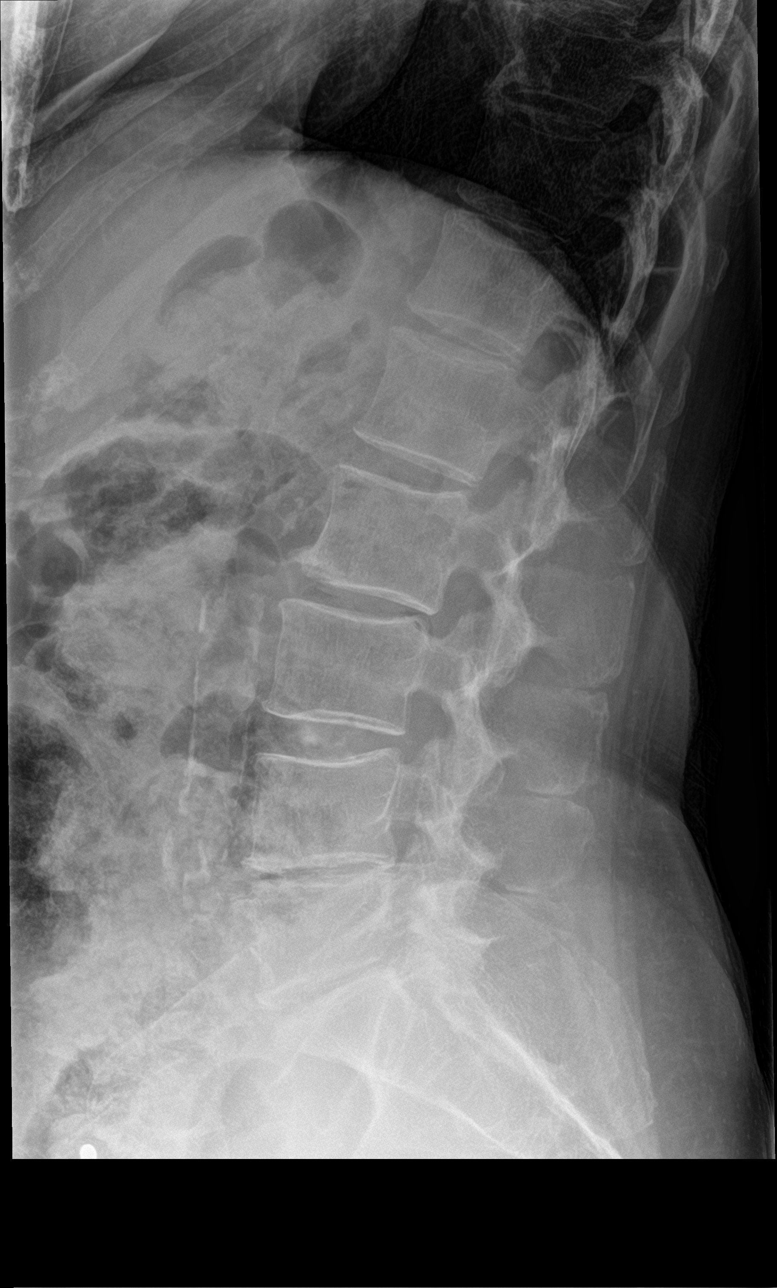

[l-spine spot]
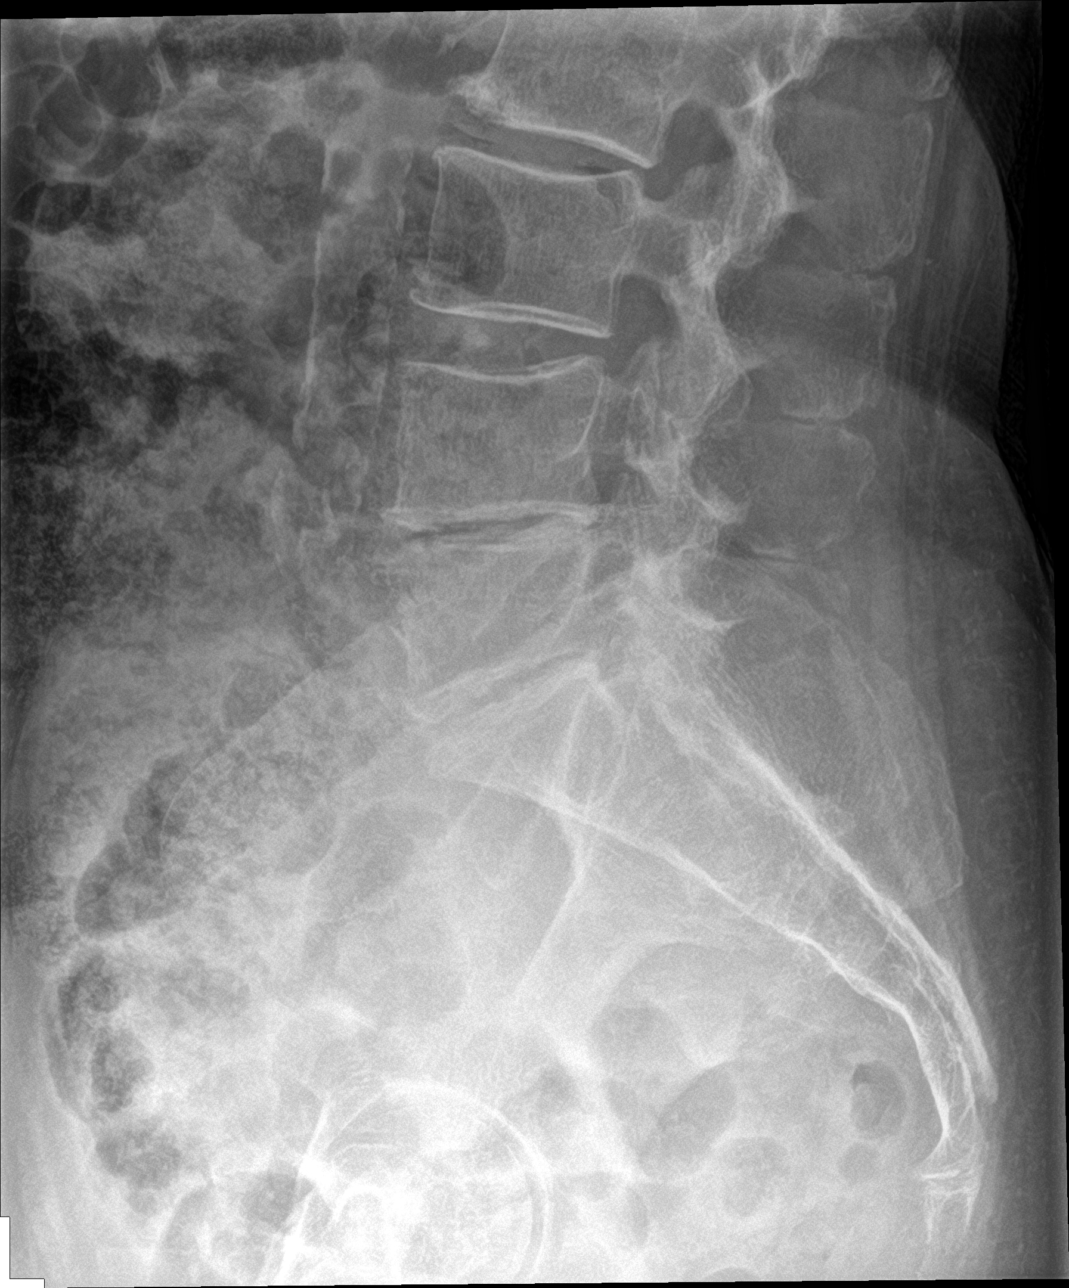

[5 of 5 positions shown; findings below may reference images not displayed]

FINDINGS: The bones are subjectively osteopenic. The vertebral bodies are
preserved in height. There is moderate disc space narrowing at L4-5
and L5-S1 with milder narrowing at L2-3. There is no
spondylolisthesis. There is mild facet joint hypertrophy at L4-5 and
at L5-S1. The pedicles and transverse processes are intact. The
observed portions of the sacrum are normal.

There is calcification in the wall of the abdominal aorta and common
iliac arteries. No definite urinary tract calcifications.
IMPRESSION: Degenerative disc disease greatest at L4-5 and L5-S1 with milder
changes at L2-3. No compression fracture or spondylolisthesis. Given
the right lower extremity radicular symptoms, lumbar spine MRI would
be a useful next imaging step.

Aortoiliac atherosclerosis.

## 2017-12-21 ENCOUNTER — Telehealth: Payer: Self-pay | Admitting: Osteopathic Medicine

## 2017-12-21 NOTE — Telephone Encounter (Signed)
Pt called wanting to make you aware that [employer] is going to contact you requesting permission for pt to get scripts through mail order.

## 2017-12-21 NOTE — Telephone Encounter (Signed)
Thank you for the update!

## 2017-12-22 ENCOUNTER — Telehealth: Payer: Self-pay

## 2017-12-22 ENCOUNTER — Other Ambulatory Visit: Payer: Self-pay

## 2017-12-22 DIAGNOSIS — F329 Major depressive disorder, single episode, unspecified: Secondary | ICD-10-CM

## 2017-12-22 DIAGNOSIS — F32A Depression, unspecified: Secondary | ICD-10-CM

## 2017-12-22 DIAGNOSIS — F419 Anxiety disorder, unspecified: Principal | ICD-10-CM

## 2017-12-22 MED ORDER — BUPROPION HCL ER (SR) 150 MG PO TB12: 150.0000 mg | ORAL_TABLET | Freq: Two times a day (BID) | ORAL | 1 refills | Status: DC

## 2017-12-22 NOTE — Telephone Encounter (Signed)
[employer] m/o pharmacy requesting med refills for clonazepam.

## 2017-12-25 NOTE — Telephone Encounter (Signed)
Pt will pick up last refill from [facility]. She is requesting that all future refills for her meds to be sent [facility] m/o pharmacy.

## 2017-12-25 NOTE — Telephone Encounter (Signed)
OK will need to call [facility] to cancel Rx (she should have one refill left on file there) and once confirmed cancel I can send through mail order  PMP reviewed, last Rx picked up at [facility] 11/27/17, can we confirm this w/ pharmacy, too?   Pt can also pick up Rx form [facility] this month and I can send Rx to mail order to start next month

## 2017-12-26 MED ORDER — CLONAZEPAM 0.5 MG PO TABS: 0.5000 mg | ORAL_TABLET | Freq: Three times a day (TID) | ORAL | 2 refills | Status: DC | PRN

## 2017-12-26 NOTE — Addendum Note (Signed)
Addended by: Deirdre Pippins on: 12/26/2017 10:34 AM   Modules accepted: Orders

## 2017-12-26 NOTE — Telephone Encounter (Signed)
Ok sent post-dated script to [facility]

## 2018-02-02 ENCOUNTER — Encounter (INDEPENDENT_AMBULATORY_CARE_PROVIDER_SITE_OTHER): Payer: Medicare PPO | Admitting: Ophthalmology

## 2018-02-09 ENCOUNTER — Telehealth: Payer: Self-pay

## 2018-02-09 NOTE — Telephone Encounter (Signed)
Med refill request form for citalopram 20 mg rec'd from [facility]. [facility] As per Pharmacist, last p/u for med was on 01/29/18. Attempted to contact pt for pharmacy verification. No answer, left a detailed vm msg for pt. Direct call back info provided.

## 2018-02-21 DIAGNOSIS — R578 Other shock: Secondary | ICD-10-CM | POA: Diagnosis not present

## 2018-02-21 DIAGNOSIS — Y92009 Unspecified place in unspecified non-institutional (private) residence as the place of occurrence of the external cause: Secondary | ICD-10-CM | POA: Diagnosis not present

## 2018-02-21 DIAGNOSIS — S14125A Central cord syndrome at C5 level of cervical spinal cord, initial encounter: Secondary | ICD-10-CM | POA: Diagnosis not present

## 2018-02-21 DIAGNOSIS — S12390A Other displaced fracture of fourth cervical vertebra, initial encounter for closed fracture: Secondary | ICD-10-CM | POA: Diagnosis not present

## 2018-02-21 DIAGNOSIS — G8250 Quadriplegia, unspecified: Secondary | ICD-10-CM | POA: Diagnosis not present

## 2018-02-21 DIAGNOSIS — M532X2 Spinal instabilities, cervical region: Secondary | ICD-10-CM | POA: Diagnosis not present

## 2018-02-21 DIAGNOSIS — F329 Major depressive disorder, single episode, unspecified: Secondary | ICD-10-CM | POA: Diagnosis not present

## 2018-02-21 DIAGNOSIS — G8911 Acute pain due to trauma: Secondary | ICD-10-CM | POA: Diagnosis not present

## 2018-02-21 DIAGNOSIS — S22049A Unspecified fracture of fourth thoracic vertebra, initial encounter for closed fracture: Secondary | ICD-10-CM | POA: Diagnosis not present

## 2018-02-21 DIAGNOSIS — D72829 Elevated white blood cell count, unspecified: Secondary | ICD-10-CM | POA: Diagnosis not present

## 2018-02-21 DIAGNOSIS — S14124A Central cord syndrome at C4 level of cervical spinal cord, initial encounter: Secondary | ICD-10-CM | POA: Diagnosis not present

## 2018-02-21 DIAGNOSIS — M4802 Spinal stenosis, cervical region: Secondary | ICD-10-CM | POA: Diagnosis not present

## 2018-02-21 DIAGNOSIS — W19XXXA Unspecified fall, initial encounter: Secondary | ICD-10-CM | POA: Diagnosis not present

## 2018-02-21 DIAGNOSIS — I959 Hypotension, unspecified: Secondary | ICD-10-CM | POA: Diagnosis not present

## 2018-02-21 DIAGNOSIS — R4020 Unspecified coma: Secondary | ICD-10-CM | POA: Diagnosis not present

## 2018-02-21 DIAGNOSIS — R40241 Glasgow coma scale score 13-15, unspecified time: Secondary | ICD-10-CM | POA: Diagnosis not present

## 2018-02-21 DIAGNOSIS — M4012 Other secondary kyphosis, cervical region: Secondary | ICD-10-CM | POA: Diagnosis not present

## 2018-02-21 DIAGNOSIS — S12490A Other displaced fracture of fifth cervical vertebra, initial encounter for closed fracture: Secondary | ICD-10-CM | POA: Diagnosis not present

## 2018-02-21 DIAGNOSIS — S22048A Other fracture of fourth thoracic vertebra, initial encounter for closed fracture: Secondary | ICD-10-CM | POA: Diagnosis not present

## 2018-02-21 DIAGNOSIS — S12590A Other displaced fracture of sixth cervical vertebra, initial encounter for closed fracture: Secondary | ICD-10-CM | POA: Diagnosis not present

## 2018-02-21 DIAGNOSIS — D62 Acute posthemorrhagic anemia: Secondary | ICD-10-CM | POA: Diagnosis not present

## 2018-02-21 DIAGNOSIS — S22029A Unspecified fracture of second thoracic vertebra, initial encounter for closed fracture: Secondary | ICD-10-CM | POA: Diagnosis not present

## 2018-02-21 DIAGNOSIS — Y998 Other external cause status: Secondary | ICD-10-CM | POA: Diagnosis not present

## 2018-02-21 DIAGNOSIS — G9529 Other cord compression: Secondary | ICD-10-CM | POA: Diagnosis not present

## 2018-02-21 DIAGNOSIS — Z794 Long term (current) use of insulin: Secondary | ICD-10-CM | POA: Diagnosis not present

## 2018-02-21 DIAGNOSIS — S14129A Central cord syndrome at unspecified level of cervical spinal cord, initial encounter: Secondary | ICD-10-CM | POA: Diagnosis not present

## 2018-02-21 DIAGNOSIS — Z043 Encounter for examination and observation following other accident: Secondary | ICD-10-CM | POA: Diagnosis not present

## 2018-02-21 DIAGNOSIS — W108XXA Fall (on) (from) other stairs and steps, initial encounter: Secondary | ICD-10-CM | POA: Diagnosis not present

## 2018-02-21 DIAGNOSIS — S14126A Central cord syndrome at C6 level of cervical spinal cord, initial encounter: Secondary | ICD-10-CM | POA: Diagnosis not present

## 2018-02-21 DIAGNOSIS — Z981 Arthrodesis status: Secondary | ICD-10-CM | POA: Diagnosis not present

## 2018-02-21 DIAGNOSIS — M4312 Spondylolisthesis, cervical region: Secondary | ICD-10-CM | POA: Diagnosis not present

## 2018-02-21 DIAGNOSIS — S12300A Unspecified displaced fracture of fourth cervical vertebra, initial encounter for closed fracture: Secondary | ICD-10-CM | POA: Diagnosis not present

## 2018-02-21 DIAGNOSIS — M4322 Fusion of spine, cervical region: Secondary | ICD-10-CM | POA: Diagnosis not present

## 2018-02-21 DIAGNOSIS — F419 Anxiety disorder, unspecified: Secondary | ICD-10-CM | POA: Diagnosis not present

## 2018-02-21 DIAGNOSIS — S069X9A Unspecified intracranial injury with loss of consciousness of unspecified duration, initial encounter: Secondary | ICD-10-CM | POA: Diagnosis not present

## 2018-02-21 DIAGNOSIS — R52 Pain, unspecified: Secondary | ICD-10-CM | POA: Diagnosis not present

## 2018-02-21 DIAGNOSIS — S12500A Unspecified displaced fracture of sixth cervical vertebra, initial encounter for closed fracture: Secondary | ICD-10-CM | POA: Diagnosis not present

## 2018-02-21 DIAGNOSIS — R200 Anesthesia of skin: Secondary | ICD-10-CM | POA: Diagnosis not present

## 2018-02-21 DIAGNOSIS — R531 Weakness: Secondary | ICD-10-CM | POA: Diagnosis not present

## 2018-02-21 DIAGNOSIS — J9811 Atelectasis: Secondary | ICD-10-CM | POA: Diagnosis not present

## 2018-02-21 DIAGNOSIS — M542 Cervicalgia: Secondary | ICD-10-CM | POA: Diagnosis not present

## 2018-02-21 DIAGNOSIS — E1165 Type 2 diabetes mellitus with hyperglycemia: Secondary | ICD-10-CM | POA: Diagnosis not present

## 2018-02-21 DIAGNOSIS — F439 Reaction to severe stress, unspecified: Secondary | ICD-10-CM | POA: Diagnosis not present

## 2018-02-21 DIAGNOSIS — S14123A Central cord syndrome at C3 level of cervical spinal cord, initial encounter: Secondary | ICD-10-CM | POA: Diagnosis not present

## 2018-02-21 DIAGNOSIS — S12400A Unspecified displaced fracture of fifth cervical vertebra, initial encounter for closed fracture: Secondary | ICD-10-CM | POA: Diagnosis not present

## 2018-02-28 DIAGNOSIS — R5381 Other malaise: Secondary | ICD-10-CM | POA: Diagnosis not present

## 2018-02-28 DIAGNOSIS — G9529 Other cord compression: Secondary | ICD-10-CM | POA: Diagnosis not present

## 2018-02-28 DIAGNOSIS — Z4789 Encounter for other orthopedic aftercare: Secondary | ICD-10-CM | POA: Diagnosis not present

## 2018-02-28 DIAGNOSIS — K592 Neurogenic bowel, not elsewhere classified: Secondary | ICD-10-CM | POA: Diagnosis not present

## 2018-02-28 DIAGNOSIS — S12300A Unspecified displaced fracture of fourth cervical vertebra, initial encounter for closed fracture: Secondary | ICD-10-CM | POA: Diagnosis not present

## 2018-02-28 DIAGNOSIS — N319 Neuromuscular dysfunction of bladder, unspecified: Secondary | ICD-10-CM | POA: Diagnosis not present

## 2018-02-28 DIAGNOSIS — E1165 Type 2 diabetes mellitus with hyperglycemia: Secondary | ICD-10-CM | POA: Diagnosis not present

## 2018-02-28 DIAGNOSIS — T83511A Infection and inflammatory reaction due to indwelling urethral catheter, initial encounter: Secondary | ICD-10-CM | POA: Diagnosis not present

## 2018-02-28 DIAGNOSIS — I951 Orthostatic hypotension: Secondary | ICD-10-CM | POA: Diagnosis not present

## 2018-02-28 DIAGNOSIS — S14109A Unspecified injury at unspecified level of cervical spinal cord, initial encounter: Secondary | ICD-10-CM | POA: Diagnosis not present

## 2018-02-28 DIAGNOSIS — W19XXXA Unspecified fall, initial encounter: Secondary | ICD-10-CM | POA: Diagnosis not present

## 2018-02-28 DIAGNOSIS — Z9641 Presence of insulin pump (external) (internal): Secondary | ICD-10-CM | POA: Diagnosis not present

## 2018-02-28 DIAGNOSIS — G8251 Quadriplegia, C1-C4 complete: Secondary | ICD-10-CM | POA: Diagnosis not present

## 2018-02-28 DIAGNOSIS — S12400A Unspecified displaced fracture of fifth cervical vertebra, initial encounter for closed fracture: Secondary | ICD-10-CM | POA: Diagnosis not present

## 2018-02-28 DIAGNOSIS — S12490A Other displaced fracture of fifth cervical vertebra, initial encounter for closed fracture: Secondary | ICD-10-CM | POA: Diagnosis not present

## 2018-02-28 DIAGNOSIS — F329 Major depressive disorder, single episode, unspecified: Secondary | ICD-10-CM | POA: Diagnosis not present

## 2018-02-28 DIAGNOSIS — S12390A Other displaced fracture of fourth cervical vertebra, initial encounter for closed fracture: Secondary | ICD-10-CM | POA: Diagnosis not present

## 2018-02-28 DIAGNOSIS — S22029D Unspecified fracture of second thoracic vertebra, subsequent encounter for fracture with routine healing: Secondary | ICD-10-CM | POA: Diagnosis not present

## 2018-02-28 DIAGNOSIS — E11649 Type 2 diabetes mellitus with hypoglycemia without coma: Secondary | ICD-10-CM | POA: Diagnosis not present

## 2018-02-28 DIAGNOSIS — W19XXXD Unspecified fall, subsequent encounter: Secondary | ICD-10-CM | POA: Diagnosis not present

## 2018-02-28 DIAGNOSIS — R42 Dizziness and giddiness: Secondary | ICD-10-CM | POA: Diagnosis not present

## 2018-02-28 DIAGNOSIS — K5900 Constipation, unspecified: Secondary | ICD-10-CM | POA: Diagnosis not present

## 2018-02-28 DIAGNOSIS — G8929 Other chronic pain: Secondary | ICD-10-CM | POA: Diagnosis not present

## 2018-02-28 DIAGNOSIS — N390 Urinary tract infection, site not specified: Secondary | ICD-10-CM | POA: Diagnosis not present

## 2018-02-28 DIAGNOSIS — M4012 Other secondary kyphosis, cervical region: Secondary | ICD-10-CM | POA: Diagnosis not present

## 2018-02-28 DIAGNOSIS — Z794 Long term (current) use of insulin: Secondary | ICD-10-CM | POA: Diagnosis not present

## 2018-02-28 DIAGNOSIS — M4712 Other spondylosis with myelopathy, cervical region: Secondary | ICD-10-CM | POA: Diagnosis not present

## 2018-02-28 DIAGNOSIS — F331 Major depressive disorder, recurrent, moderate: Secondary | ICD-10-CM | POA: Diagnosis not present

## 2018-02-28 DIAGNOSIS — I959 Hypotension, unspecified: Secondary | ICD-10-CM | POA: Diagnosis not present

## 2018-02-28 DIAGNOSIS — G4700 Insomnia, unspecified: Secondary | ICD-10-CM | POA: Diagnosis not present

## 2018-02-28 DIAGNOSIS — R109 Unspecified abdominal pain: Secondary | ICD-10-CM | POA: Diagnosis not present

## 2018-02-28 DIAGNOSIS — E1169 Type 2 diabetes mellitus with other specified complication: Secondary | ICD-10-CM | POA: Diagnosis not present

## 2018-02-28 DIAGNOSIS — S22049D Unspecified fracture of fourth thoracic vertebra, subsequent encounter for fracture with routine healing: Secondary | ICD-10-CM | POA: Diagnosis not present

## 2018-02-28 DIAGNOSIS — Z7409 Other reduced mobility: Secondary | ICD-10-CM | POA: Diagnosis not present

## 2018-02-28 DIAGNOSIS — S12500A Unspecified displaced fracture of sixth cervical vertebra, initial encounter for closed fracture: Secondary | ICD-10-CM | POA: Diagnosis not present

## 2018-02-28 DIAGNOSIS — G903 Multi-system degeneration of the autonomic nervous system: Secondary | ICD-10-CM | POA: Diagnosis not present

## 2018-02-28 DIAGNOSIS — Z466 Encounter for fitting and adjustment of urinary device: Secondary | ICD-10-CM | POA: Diagnosis not present

## 2018-02-28 DIAGNOSIS — H8110 Benign paroxysmal vertigo, unspecified ear: Secondary | ICD-10-CM | POA: Diagnosis not present

## 2018-02-28 DIAGNOSIS — F4310 Post-traumatic stress disorder, unspecified: Secondary | ICD-10-CM | POA: Diagnosis not present

## 2018-02-28 DIAGNOSIS — Z743 Need for continuous supervision: Secondary | ICD-10-CM | POA: Diagnosis not present

## 2018-02-28 DIAGNOSIS — R9431 Abnormal electrocardiogram [ECG] [EKG]: Secondary | ICD-10-CM | POA: Diagnosis not present

## 2018-02-28 DIAGNOSIS — S14124D Central cord syndrome at C4 level of cervical spinal cord, subsequent encounter: Secondary | ICD-10-CM | POA: Diagnosis not present

## 2018-02-28 DIAGNOSIS — S069X9A Unspecified intracranial injury with loss of consciousness of unspecified duration, initial encounter: Secondary | ICD-10-CM | POA: Diagnosis not present

## 2018-02-28 DIAGNOSIS — S14124A Central cord syndrome at C4 level of cervical spinal cord, initial encounter: Secondary | ICD-10-CM | POA: Insufficient documentation

## 2018-02-28 DIAGNOSIS — R531 Weakness: Secondary | ICD-10-CM | POA: Diagnosis not present

## 2018-02-28 DIAGNOSIS — Z789 Other specified health status: Secondary | ICD-10-CM | POA: Insufficient documentation

## 2018-02-28 DIAGNOSIS — W108XXA Fall (on) (from) other stairs and steps, initial encounter: Secondary | ICD-10-CM | POA: Diagnosis not present

## 2018-02-28 DIAGNOSIS — G8250 Quadriplegia, unspecified: Secondary | ICD-10-CM | POA: Diagnosis not present

## 2018-02-28 DIAGNOSIS — S12500D Unspecified displaced fracture of sixth cervical vertebra, subsequent encounter for fracture with routine healing: Secondary | ICD-10-CM | POA: Diagnosis not present

## 2018-02-28 DIAGNOSIS — B964 Proteus (mirabilis) (morganii) as the cause of diseases classified elsewhere: Secondary | ICD-10-CM | POA: Diagnosis not present

## 2018-02-28 DIAGNOSIS — F418 Other specified anxiety disorders: Secondary | ICD-10-CM | POA: Diagnosis not present

## 2018-02-28 DIAGNOSIS — S12590A Other displaced fracture of sixth cervical vertebra, initial encounter for closed fracture: Secondary | ICD-10-CM | POA: Diagnosis not present

## 2018-02-28 DIAGNOSIS — D62 Acute posthemorrhagic anemia: Secondary | ICD-10-CM | POA: Diagnosis not present

## 2018-02-28 DIAGNOSIS — Z981 Arthrodesis status: Secondary | ICD-10-CM | POA: Diagnosis not present

## 2018-02-28 DIAGNOSIS — R079 Chest pain, unspecified: Secondary | ICD-10-CM | POA: Diagnosis not present

## 2018-02-28 DIAGNOSIS — R279 Unspecified lack of coordination: Secondary | ICD-10-CM | POA: Diagnosis not present

## 2018-02-28 DIAGNOSIS — S12300D Unspecified displaced fracture of fourth cervical vertebra, subsequent encounter for fracture with routine healing: Secondary | ICD-10-CM | POA: Diagnosis not present

## 2018-02-28 DIAGNOSIS — J811 Chronic pulmonary edema: Secondary | ICD-10-CM | POA: Diagnosis not present

## 2018-02-28 DIAGNOSIS — K219 Gastro-esophageal reflux disease without esophagitis: Secondary | ICD-10-CM | POA: Diagnosis not present

## 2018-02-28 DIAGNOSIS — Y92009 Unspecified place in unspecified non-institutional (private) residence as the place of occurrence of the external cause: Secondary | ICD-10-CM | POA: Diagnosis not present

## 2018-02-28 DIAGNOSIS — M532X2 Spinal instabilities, cervical region: Secondary | ICD-10-CM | POA: Diagnosis not present

## 2018-02-28 DIAGNOSIS — E119 Type 2 diabetes mellitus without complications: Secondary | ICD-10-CM | POA: Diagnosis not present

## 2018-02-28 DIAGNOSIS — S12400D Unspecified displaced fracture of fifth cervical vertebra, subsequent encounter for fracture with routine healing: Secondary | ICD-10-CM | POA: Diagnosis not present

## 2018-02-28 DIAGNOSIS — E039 Hypothyroidism, unspecified: Secondary | ICD-10-CM | POA: Diagnosis not present

## 2018-02-28 DIAGNOSIS — F419 Anxiety disorder, unspecified: Secondary | ICD-10-CM | POA: Diagnosis not present

## 2018-02-28 DIAGNOSIS — R001 Bradycardia, unspecified: Secondary | ICD-10-CM | POA: Diagnosis not present

## 2018-02-28 DIAGNOSIS — R197 Diarrhea, unspecified: Secondary | ICD-10-CM | POA: Diagnosis not present

## 2018-02-28 DIAGNOSIS — M4322 Fusion of spine, cervical region: Secondary | ICD-10-CM | POA: Diagnosis not present

## 2018-02-28 DIAGNOSIS — M961 Postlaminectomy syndrome, not elsewhere classified: Secondary | ICD-10-CM | POA: Diagnosis not present

## 2018-02-28 DIAGNOSIS — G8252 Quadriplegia, C1-C4 incomplete: Secondary | ICD-10-CM | POA: Diagnosis not present

## 2018-03-13 ENCOUNTER — Telehealth: Payer: Self-pay

## 2018-03-13 NOTE — Telephone Encounter (Signed)
I haven't heard anything really good or bad about any local facility - wherever insurance will cover is the best place!

## 2018-03-13 NOTE — Telephone Encounter (Signed)
Dorene Sorrow called and left a message stating Veronica [name] is at [facility] due to an accident. They are wanting to move her to rehab. He wants to know if there is a place [name] would recommend.

## 2018-03-14 MED ORDER — PANTOPRAZOLE SODIUM 40 MG PO TBEC
40.00 | DELAYED_RELEASE_TABLET | ORAL | Status: DC
Start: 2018-03-15 — End: 2018-03-14

## 2018-03-14 MED ORDER — LEVOTHYROXINE SODIUM 88 MCG PO TABS
88.00 | ORAL_TABLET | ORAL | Status: DC
Start: 2018-03-15 — End: 2018-03-14

## 2018-03-14 MED ORDER — HYDROXYZINE HCL 25 MG PO TABS
25.00 | ORAL_TABLET | ORAL | Status: DC
Start: ? — End: 2018-03-14

## 2018-03-14 MED ORDER — DOXEPIN HCL 10 MG PO CAPS
10.00 | ORAL_CAPSULE | ORAL | Status: DC
Start: 2018-03-14 — End: 2018-03-14

## 2018-03-14 MED ORDER — ENOXAPARIN SODIUM 30 MG/0.3ML ~~LOC~~ SOLN
30.00 | SUBCUTANEOUS | Status: DC
Start: 2018-03-14 — End: 2018-03-14

## 2018-03-14 MED ORDER — INSULIN GLARGINE 100 UNIT/ML SOLOSTAR PEN
12.00 | PEN_INJECTOR | SUBCUTANEOUS | Status: DC
Start: 2018-03-15 — End: 2018-03-14

## 2018-03-14 MED ORDER — OXYCODONE HCL 5 MG PO TABS
5.00 | ORAL_TABLET | ORAL | Status: DC
Start: ? — End: 2018-03-14

## 2018-03-14 MED ORDER — ACETAMINOPHEN 500 MG PO TABS
1000.00 | ORAL_TABLET | ORAL | Status: DC
Start: 2018-03-14 — End: 2018-03-14

## 2018-03-14 MED ORDER — TRAMADOL HCL 50 MG PO TABS
25.00 | ORAL_TABLET | ORAL | Status: DC
Start: 2018-03-14 — End: 2018-03-14

## 2018-03-14 MED ORDER — GABAPENTIN 300 MG PO CAPS
900.00 | ORAL_CAPSULE | ORAL | Status: DC
Start: 2018-03-14 — End: 2018-03-14

## 2018-03-14 MED ORDER — MELATONIN 3 MG PO TABS
6.00 | ORAL_TABLET | ORAL | Status: DC
Start: 2018-03-14 — End: 2018-03-14

## 2018-03-14 MED ORDER — CEFUROXIME AXETIL 250 MG PO TABS
250.00 | ORAL_TABLET | ORAL | Status: DC
Start: 2018-03-14 — End: 2018-03-14

## 2018-03-14 MED ORDER — DULOXETINE HCL 30 MG PO CPEP
30.00 | ORAL_CAPSULE | ORAL | Status: DC
Start: 2018-03-15 — End: 2018-03-14

## 2018-03-14 MED ORDER — DEXTROSE 10 % IV SOLN
125.00 | INTRAVENOUS | Status: DC
Start: ? — End: 2018-03-14

## 2018-03-14 MED ORDER — BACLOFEN 10 MG PO TABS
5.00 | ORAL_TABLET | ORAL | Status: DC
Start: ? — End: 2018-03-14

## 2018-03-14 MED ORDER — CLONAZEPAM 0.5 MG PO TABS
.50 | ORAL_TABLET | ORAL | Status: DC
Start: 2018-03-14 — End: 2018-03-14

## 2018-03-14 MED ORDER — BISACODYL 10 MG RE SUPP
10.00 | RECTAL | Status: DC
Start: 2018-03-14 — End: 2018-03-14

## 2018-03-14 MED ORDER — GABAPENTIN 300 MG PO CAPS
1200.00 | ORAL_CAPSULE | ORAL | Status: DC
Start: 2018-03-14 — End: 2018-03-14

## 2018-03-14 MED ORDER — INSULIN LISPRO 100 UNIT/ML ~~LOC~~ SOLN
1.00 | SUBCUTANEOUS | Status: DC
Start: 2018-03-14 — End: 2018-03-14

## 2018-03-14 NOTE — Telephone Encounter (Signed)
Her husband wanted [name] and Dr Denyse Amass to know that she broke her neck. It is in Care Everywhere.

## 2018-03-14 NOTE — Telephone Encounter (Signed)
Thanks for FYI

## 2018-03-16 NOTE — Telephone Encounter (Signed)
Noted. We wish for full recovery.

## 2018-03-28 DIAGNOSIS — B964 Proteus (mirabilis) (morganii) as the cause of diseases classified elsewhere: Secondary | ICD-10-CM | POA: Diagnosis not present

## 2018-03-28 DIAGNOSIS — R52 Pain, unspecified: Secondary | ICD-10-CM | POA: Diagnosis not present

## 2018-03-28 DIAGNOSIS — Z885 Allergy status to narcotic agent status: Secondary | ICD-10-CM | POA: Diagnosis not present

## 2018-03-28 DIAGNOSIS — Z794 Long term (current) use of insulin: Secondary | ICD-10-CM | POA: Diagnosis not present

## 2018-03-28 DIAGNOSIS — Z4789 Encounter for other orthopedic aftercare: Secondary | ICD-10-CM | POA: Diagnosis not present

## 2018-03-28 DIAGNOSIS — R42 Dizziness and giddiness: Secondary | ICD-10-CM | POA: Diagnosis not present

## 2018-03-28 DIAGNOSIS — K5901 Slow transit constipation: Secondary | ICD-10-CM | POA: Diagnosis not present

## 2018-03-28 DIAGNOSIS — W19XXXD Unspecified fall, subsequent encounter: Secondary | ICD-10-CM | POA: Diagnosis not present

## 2018-03-28 DIAGNOSIS — R197 Diarrhea, unspecified: Secondary | ICD-10-CM | POA: Diagnosis not present

## 2018-03-28 DIAGNOSIS — F329 Major depressive disorder, single episode, unspecified: Secondary | ICD-10-CM | POA: Diagnosis not present

## 2018-03-28 DIAGNOSIS — Z880 Allergy status to penicillin: Secondary | ICD-10-CM | POA: Diagnosis not present

## 2018-03-28 DIAGNOSIS — G8251 Quadriplegia, C1-C4 complete: Secondary | ICD-10-CM | POA: Diagnosis not present

## 2018-03-28 DIAGNOSIS — S14104D Unspecified injury at C4 level of cervical spinal cord, subsequent encounter: Secondary | ICD-10-CM | POA: Diagnosis not present

## 2018-03-28 DIAGNOSIS — R0902 Hypoxemia: Secondary | ICD-10-CM | POA: Diagnosis not present

## 2018-03-28 DIAGNOSIS — E109 Type 1 diabetes mellitus without complications: Secondary | ICD-10-CM | POA: Diagnosis not present

## 2018-03-28 DIAGNOSIS — S12300A Unspecified displaced fracture of fourth cervical vertebra, initial encounter for closed fracture: Secondary | ICD-10-CM | POA: Diagnosis not present

## 2018-03-28 DIAGNOSIS — R195 Other fecal abnormalities: Secondary | ICD-10-CM | POA: Diagnosis not present

## 2018-03-28 DIAGNOSIS — N390 Urinary tract infection, site not specified: Secondary | ICD-10-CM | POA: Diagnosis not present

## 2018-03-28 DIAGNOSIS — F419 Anxiety disorder, unspecified: Secondary | ICD-10-CM | POA: Diagnosis not present

## 2018-03-28 DIAGNOSIS — L539 Erythematous condition, unspecified: Secondary | ICD-10-CM | POA: Diagnosis not present

## 2018-03-28 DIAGNOSIS — R410 Disorientation, unspecified: Secondary | ICD-10-CM | POA: Diagnosis not present

## 2018-03-28 DIAGNOSIS — T83518A Infection and inflammatory reaction due to other urinary catheter, initial encounter: Secondary | ICD-10-CM | POA: Diagnosis not present

## 2018-03-28 DIAGNOSIS — R000 Tachycardia, unspecified: Secondary | ICD-10-CM | POA: Diagnosis not present

## 2018-03-28 DIAGNOSIS — G9520 Unspecified cord compression: Secondary | ICD-10-CM | POA: Diagnosis not present

## 2018-03-28 DIAGNOSIS — T83511A Infection and inflammatory reaction due to indwelling urethral catheter, initial encounter: Secondary | ICD-10-CM | POA: Diagnosis not present

## 2018-03-28 DIAGNOSIS — M79605 Pain in left leg: Secondary | ICD-10-CM | POA: Diagnosis not present

## 2018-03-28 DIAGNOSIS — Z981 Arthrodesis status: Secondary | ICD-10-CM | POA: Diagnosis not present

## 2018-03-28 DIAGNOSIS — Z7409 Other reduced mobility: Secondary | ICD-10-CM | POA: Diagnosis not present

## 2018-03-28 DIAGNOSIS — Z66 Do not resuscitate: Secondary | ICD-10-CM | POA: Diagnosis not present

## 2018-03-28 DIAGNOSIS — Z466 Encounter for fitting and adjustment of urinary device: Secondary | ICD-10-CM | POA: Diagnosis not present

## 2018-03-28 DIAGNOSIS — R4182 Altered mental status, unspecified: Secondary | ICD-10-CM | POA: Diagnosis not present

## 2018-03-28 DIAGNOSIS — S14124S Central cord syndrome at C4 level of cervical spinal cord, sequela: Secondary | ICD-10-CM | POA: Diagnosis not present

## 2018-03-28 DIAGNOSIS — M4712 Other spondylosis with myelopathy, cervical region: Secondary | ICD-10-CM | POA: Diagnosis not present

## 2018-03-28 DIAGNOSIS — G8252 Quadriplegia, C1-C4 incomplete: Secondary | ICD-10-CM | POA: Diagnosis not present

## 2018-03-28 DIAGNOSIS — I959 Hypotension, unspecified: Secondary | ICD-10-CM | POA: Diagnosis not present

## 2018-03-28 DIAGNOSIS — R609 Edema, unspecified: Secondary | ICD-10-CM | POA: Diagnosis not present

## 2018-03-28 DIAGNOSIS — S14124D Central cord syndrome at C4 level of cervical spinal cord, subsequent encounter: Secondary | ICD-10-CM | POA: Diagnosis not present

## 2018-03-28 DIAGNOSIS — K5900 Constipation, unspecified: Secondary | ICD-10-CM | POA: Diagnosis not present

## 2018-03-28 DIAGNOSIS — A419 Sepsis, unspecified organism: Secondary | ICD-10-CM | POA: Diagnosis not present

## 2018-03-28 DIAGNOSIS — R5381 Other malaise: Secondary | ICD-10-CM | POA: Diagnosis not present

## 2018-03-28 DIAGNOSIS — R279 Unspecified lack of coordination: Secondary | ICD-10-CM | POA: Diagnosis not present

## 2018-03-28 DIAGNOSIS — R531 Weakness: Secondary | ICD-10-CM | POA: Diagnosis not present

## 2018-03-28 DIAGNOSIS — S14124A Central cord syndrome at C4 level of cervical spinal cord, initial encounter: Secondary | ICD-10-CM | POA: Diagnosis not present

## 2018-03-28 DIAGNOSIS — N319 Neuromuscular dysfunction of bladder, unspecified: Secondary | ICD-10-CM | POA: Diagnosis not present

## 2018-03-28 DIAGNOSIS — Z743 Need for continuous supervision: Secondary | ICD-10-CM | POA: Diagnosis not present

## 2018-03-28 DIAGNOSIS — E119 Type 2 diabetes mellitus without complications: Secondary | ICD-10-CM | POA: Diagnosis not present

## 2018-03-28 DIAGNOSIS — R404 Transient alteration of awareness: Secondary | ICD-10-CM | POA: Diagnosis not present

## 2018-03-29 ENCOUNTER — Other Ambulatory Visit: Payer: Self-pay

## 2018-03-29 DIAGNOSIS — G9520 Unspecified cord compression: Secondary | ICD-10-CM | POA: Diagnosis not present

## 2018-03-29 DIAGNOSIS — S12300A Unspecified displaced fracture of fourth cervical vertebra, initial encounter for closed fracture: Secondary | ICD-10-CM | POA: Diagnosis not present

## 2018-03-29 DIAGNOSIS — E109 Type 1 diabetes mellitus without complications: Secondary | ICD-10-CM | POA: Diagnosis not present

## 2018-03-29 DIAGNOSIS — Z981 Arthrodesis status: Secondary | ICD-10-CM | POA: Diagnosis not present

## 2018-03-29 NOTE — Patient Outreach (Signed)
[facility]) Care Management  03/29/2018  Reynolds Glidden [birth year] [id number]  Transition of care  Referral date: 03/29/18 Referral source: discharged from [facility] 03/28/18 Insurance: [employer] Attempt #2  Telephone call to patient regarding transition of care referral. Unable to reach patient. HIPAA complaint voice message left with call back phone number.   PLAN:  RNCM will attempt 2nd telephone call to patient within 4 business days.  RNCM will send outreach letter to attempt contact  [name] RN,BSN,CCM [facility] Telephonic  [phone]

## 2018-04-02 DIAGNOSIS — S12300A Unspecified displaced fracture of fourth cervical vertebra, initial encounter for closed fracture: Secondary | ICD-10-CM | POA: Diagnosis not present

## 2018-04-02 DIAGNOSIS — G9520 Unspecified cord compression: Secondary | ICD-10-CM | POA: Diagnosis not present

## 2018-04-02 DIAGNOSIS — Z981 Arthrodesis status: Secondary | ICD-10-CM | POA: Diagnosis not present

## 2018-04-02 DIAGNOSIS — K5900 Constipation, unspecified: Secondary | ICD-10-CM | POA: Diagnosis not present

## 2018-04-03 ENCOUNTER — Other Ambulatory Visit: Payer: Self-pay

## 2018-04-03 NOTE — Patient Outreach (Signed)
[facility]) Care Management  04/03/2018  Veronica [name] 1941/01/06 [id number]  Transition of care  Referral date: 03/29/18 Referral source: discharged from [facility] 03/28/18 Insurance: [employer] Attempt #2  Telephone call to patient regarding transition of care referral. Unable to reach patient. HIPAA compliant voice message left with call back phone number.   PLAN; RNCM will attempt 3rd telephone call to patient within 4 business days.   [name] RN,BSN,CCM [facility] Telephonic  [phone]

## 2018-04-04 DIAGNOSIS — S14124D Central cord syndrome at C4 level of cervical spinal cord, subsequent encounter: Secondary | ICD-10-CM | POA: Diagnosis not present

## 2018-04-04 DIAGNOSIS — K5901 Slow transit constipation: Secondary | ICD-10-CM | POA: Diagnosis not present

## 2018-04-04 DIAGNOSIS — S14124S Central cord syndrome at C4 level of cervical spinal cord, sequela: Secondary | ICD-10-CM | POA: Diagnosis not present

## 2018-04-04 DIAGNOSIS — E109 Type 1 diabetes mellitus without complications: Secondary | ICD-10-CM | POA: Diagnosis not present

## 2018-04-04 DIAGNOSIS — Z7409 Other reduced mobility: Secondary | ICD-10-CM | POA: Diagnosis not present

## 2018-04-05 ENCOUNTER — Other Ambulatory Visit: Payer: Self-pay

## 2018-04-05 NOTE — Patient Outreach (Signed)
[facility]) Care Management  04/05/2018  Veronica [name] 06/08/1940 [id number]  Transition of care  Referral date:03/29/18 Referral source:discharged from [facility] 03/28/18 Insurance:[employer] Attempt #2  Telephone call to patient regarding transition of care referral. Unable to reach patient. HIPAA compliant voice message left with call back phone number.   PLAN: If no return call will proceed with closure   [name] RN,BSN,CCM [facility] Telephonic  [phone]

## 2018-04-10 DIAGNOSIS — G9520 Unspecified cord compression: Secondary | ICD-10-CM | POA: Diagnosis not present

## 2018-04-10 DIAGNOSIS — S12300A Unspecified displaced fracture of fourth cervical vertebra, initial encounter for closed fracture: Secondary | ICD-10-CM | POA: Diagnosis not present

## 2018-04-10 DIAGNOSIS — R195 Other fecal abnormalities: Secondary | ICD-10-CM | POA: Diagnosis not present

## 2018-04-11 ENCOUNTER — Other Ambulatory Visit: Payer: Self-pay

## 2018-04-11 NOTE — Patient Outreach (Signed)
[facility]) Care Management  04/11/2018  Veronica [name] 1940-06-18 [id number]   Case closure / Transition of care  Referral date:03/29/18 Referral source:discharged from [facility] 03/28/18 Insurance:[employer]  No response after 3 telephone calls and outreach letter attempt.  PLAN: RNCM will close patient due to being unable to reach.  RNCM will send closure notification to patient's primary MD   [name] RN,BSN,CCM [facility] Telephonic  [phone]

## 2018-04-16 DIAGNOSIS — S14124D Central cord syndrome at C4 level of cervical spinal cord, subsequent encounter: Secondary | ICD-10-CM | POA: Diagnosis not present

## 2018-04-16 DIAGNOSIS — S14104D Unspecified injury at C4 level of cervical spinal cord, subsequent encounter: Secondary | ICD-10-CM | POA: Diagnosis not present

## 2018-04-16 DIAGNOSIS — Z4789 Encounter for other orthopedic aftercare: Secondary | ICD-10-CM | POA: Diagnosis not present

## 2018-04-16 DIAGNOSIS — Z981 Arthrodesis status: Secondary | ICD-10-CM | POA: Diagnosis not present

## 2018-04-19 NOTE — Progress Notes (Deleted)
Subjective:   Veronica [name] is a 78 y.o. female who presents for Medicare Annual (Subsequent) preventive examination.  Review of Systems:  No ROS.  Medicare Wellness Visit. Additional risk factors are reflected in the social history.    Sleep patterns: Home Safety/Smoke Alarms: Feels safe in home. Smoke alarms in place.  Living environment; Seat Belt Safety/Bike Helmet: Wears seat belt.   Female:   Pap-  Aged out     Mammo-       Dexa scan-        CCS- aged out      Objective:     Vitals: There were no vitals taken for this visit.  There is no height or weight on file to calculate BMI.  Advanced Directives 01/21/2016 03/03/2011  Does Patient Have a Medical Advance Directive? Yes Patient does not have advance directive  Pre-existing out of facility DNR order (yellow form or pink MOST form) - No    Tobacco Social History   Tobacco Use  Smoking Status Never Smoker  Smokeless Tobacco Never Used     Counseling given: Not Answered   Clinical Intake:                       Past Medical History:  Diagnosis Date  . Anemia   . Diabetic retinopathy (HCC) 03/23/2015  . GERD (gastroesophageal reflux disease) 01/05/2015  . Hypothyroid   . Osteopenia 03/03/2014  . Type 2 diabetes mellitus with microalbuminuria or microproteinuria 03/03/2014   Actos added January 2016.     Past Surgical History:  Procedure Laterality Date  . APPENDECTOMY  02/28/11  . CESAREAN SECTION    . LAPAROSCOPIC APPENDECTOMY  03/03/2011   Procedure: APPENDECTOMY LAPAROSCOPIC;  Surgeon: Valarie Merino, MD;  Location: [facility];  Service: General;  Laterality: N/A;  . TONSILLECTOMY     Family History  Problem Relation Age of Onset  . Cancer Mother        breast  . Diabetes Mother   . Hypertension Mother   . Stroke Mother   . Cancer Father        axillary tumor   Social History   Socioeconomic History  . Marital status: Married    Spouse name: Not on file  . Number of children:  Not on file  . Years of education: Not on file  . Highest education level: Not on file  Occupational History  . Not on file  Social Needs  . Financial resource strain: Not on file  . Food insecurity:    Worry: Not on file    Inability: Not on file  . Transportation needs:    Medical: Not on file    Non-medical: Not on file  Tobacco Use  . Smoking status: Never Smoker  . Smokeless tobacco: Never Used  Substance and Sexual Activity  . Alcohol use: No  . Drug use: No  . Sexual activity: Not on file  Lifestyle  . Physical activity:    Days per week: Not on file    Minutes per session: Not on file  . Stress: Not on file  Relationships  . Social connections:    Talks on phone: Not on file    Gets together: Not on file    Attends religious service: Not on file    Active member of club or organization: Not on file    Attends meetings of clubs or organizations: Not on file    Relationship status: Not on  file  Other Topics Concern  . Not on file  Social History Narrative  . Not on file    Outpatient Encounter Medications as of 04/24/2018  Medication Sig  . buPROPion (WELLBUTRIN SR) 150 MG 12 hr tablet Take 1 tablet (150 mg total) by mouth 2 (two) times daily.  . citalopram (CELEXA) 20 MG tablet Take 1 tablet (20 mg total) by mouth daily.  . citalopram (CELEXA) 40 MG tablet Take 1 tablet (40 mg total) by mouth daily.  . clonazePAM (KLONOPIN) 0.5 MG tablet Take 1 tablet (0.5 mg total) by mouth 3 (three) times daily as needed (#90 for thirty days). for anxiety  . cyclobenzaprine (FLEXERIL) 10 MG tablet Take 0.5-1 tablets (5-10 mg total) by mouth 3 (three) times daily as needed for muscle spasms. Caution: can cause drowsiness  . insulin aspart (FIASP FLEXTOUCH) 100 UNIT/ML FlexPen Inject into the skin.  [name] insulin degludec (TRESIBA) 100 UNIT/ML SOPN FlexTouch Pen Inject into the skin.  [name] insulin NPH Human (HUMULIN N,NOVOLIN N) 100 UNIT/ML injection Inject into the skin.  [name] insulin  regular (NOVOLIN R,HUMULIN R) 100 units/mL injection Inject into the skin.  [name] levothyroxine (SYNTHROID, LEVOTHROID) 88 MCG tablet Take 1 tablet (88 mcg total) by mouth daily. FOLLOWING WITH ENDOCRINOLOGY DR LEVY  . metFORMIN (GLUCOPHAGE-XR) 500 MG 24 hr tablet Take by mouth. FOLLOWING W/ ENDOCRINOLOGY DR. Shawnee Knapp  . ranitidine (ZANTAC) 300 MG capsule Take 1 capsule (300 mg total) by mouth every evening.   No facility-administered encounter medications on file as of 04/24/2018.     Activities of Daily Living No flowsheet data found.  Patient Care Team: Sunnie Nielsen, DO as PCP - General (Osteopathic Medicine)    Assessment:   This is a routine wellness examination for [place].Physical assessment deferred to PCP.   Exercise Activities and Dietary recommendations   Diet  Breakfast: Lunch:  Dinner:       Goals   None     Fall Risk Fall Risk  10/26/2016  Falls in the past year? No   Is the patient's home free of loose throw rugs in walkways, pet beds, electrical cords, etc?   {Blank single:19197::"yes","no"}      Grab bars in the bathroom? {Blank single:19197::"yes","no"}      Handrails on the stairs?   {Blank single:19197::"yes","no"}      Adequate lighting?   {Blank single:19197::"yes","no"}   Depression Screen PHQ 2/9 Scores 10/24/2017 08/10/2017 05/02/2017 11/21/2016  PHQ - 2 Score 0 0 2 4  PHQ- 9 Score 0 0 6 16     Cognitive Function        Immunization History  Administered Date(s) Administered  . Influenza Split 01/08/2014  . Influenza, High Dose Seasonal PF 10/27/2015, 10/24/2017  . Influenza,inj,Quad PF,6+ Mos 10/28/2016  . Influenza-Unspecified 01/06/2014, 12/23/2014  . Pneumococcal Conjugate-13 01/06/2014  . Pneumococcal Polysaccharide-23 01/08/2014  . Td 02/28/2009    Screening Tests Health Maintenance  Topic Date Due  . FOOT EXAM  01/29/1949  . DEXA SCAN  01/30/2004  . URINE MICROALBUMIN  01/02/2015  . HEMOGLOBIN A1C  08/01/2016  . OPHTHALMOLOGY  EXAM  02/02/2018  . TETANUS/TDAP  03/01/2019  . INFLUENZA VACCINE  Completed  . PNA vac Low Risk Adult  Completed       Plan:   ***   I have personally reviewed and noted the following in the patient's chart:   . Medical and social history . Use of alcohol, tobacco or illicit drugs  . Current medications and supplements .  Functional ability and status . Nutritional status . Physical activity . Advanced directives . List of other physicians . Hospitalizations, surgeries, and ER visits in previous 12 months . Vitals . Screenings to include cognitive, depression, and falls . Referrals and appointments  In addition, I have reviewed and discussed with patient certain preventive protocols, quality metrics, and best practice recommendations. A written personalized care plan for preventive services as well as general preventive health recommendations were provided to patient.     Normand Sloop, LPN  1/61/0960

## 2018-04-20 DIAGNOSIS — S12300A Unspecified displaced fracture of fourth cervical vertebra, initial encounter for closed fracture: Secondary | ICD-10-CM | POA: Diagnosis not present

## 2018-04-20 DIAGNOSIS — F419 Anxiety disorder, unspecified: Secondary | ICD-10-CM | POA: Diagnosis not present

## 2018-04-20 DIAGNOSIS — G9520 Unspecified cord compression: Secondary | ICD-10-CM | POA: Diagnosis not present

## 2018-04-20 DIAGNOSIS — Z981 Arthrodesis status: Secondary | ICD-10-CM | POA: Diagnosis not present

## 2018-04-23 DIAGNOSIS — F419 Anxiety disorder, unspecified: Secondary | ICD-10-CM | POA: Diagnosis not present

## 2018-04-23 DIAGNOSIS — S12300A Unspecified displaced fracture of fourth cervical vertebra, initial encounter for closed fracture: Secondary | ICD-10-CM | POA: Diagnosis not present

## 2018-04-23 DIAGNOSIS — G9520 Unspecified cord compression: Secondary | ICD-10-CM | POA: Diagnosis not present

## 2018-04-23 DIAGNOSIS — Z981 Arthrodesis status: Secondary | ICD-10-CM | POA: Diagnosis not present

## 2018-04-24 ENCOUNTER — Ambulatory Visit: Payer: Medicare PPO

## 2018-04-25 DIAGNOSIS — F419 Anxiety disorder, unspecified: Secondary | ICD-10-CM | POA: Diagnosis not present

## 2018-04-25 DIAGNOSIS — Z981 Arthrodesis status: Secondary | ICD-10-CM | POA: Diagnosis not present

## 2018-04-25 DIAGNOSIS — G9520 Unspecified cord compression: Secondary | ICD-10-CM | POA: Diagnosis not present

## 2018-04-25 DIAGNOSIS — S12300A Unspecified displaced fracture of fourth cervical vertebra, initial encounter for closed fracture: Secondary | ICD-10-CM | POA: Diagnosis not present

## 2018-04-26 ENCOUNTER — Ambulatory Visit: Payer: Medicare PPO | Admitting: Osteopathic Medicine

## 2018-04-27 DIAGNOSIS — F419 Anxiety disorder, unspecified: Secondary | ICD-10-CM | POA: Diagnosis not present

## 2018-04-27 DIAGNOSIS — Z981 Arthrodesis status: Secondary | ICD-10-CM | POA: Diagnosis not present

## 2018-04-27 DIAGNOSIS — S12300A Unspecified displaced fracture of fourth cervical vertebra, initial encounter for closed fracture: Secondary | ICD-10-CM | POA: Diagnosis not present

## 2018-04-27 DIAGNOSIS — G9520 Unspecified cord compression: Secondary | ICD-10-CM | POA: Diagnosis not present

## 2018-04-30 DIAGNOSIS — S14124S Central cord syndrome at C4 level of cervical spinal cord, sequela: Secondary | ICD-10-CM | POA: Diagnosis not present

## 2018-04-30 DIAGNOSIS — G8252 Quadriplegia, C1-C4 incomplete: Secondary | ICD-10-CM | POA: Diagnosis not present

## 2018-04-30 DIAGNOSIS — N390 Urinary tract infection, site not specified: Secondary | ICD-10-CM | POA: Diagnosis not present

## 2018-04-30 DIAGNOSIS — R000 Tachycardia, unspecified: Secondary | ICD-10-CM | POA: Diagnosis not present

## 2018-04-30 DIAGNOSIS — A419 Sepsis, unspecified organism: Secondary | ICD-10-CM | POA: Diagnosis not present

## 2018-04-30 DIAGNOSIS — R52 Pain, unspecified: Secondary | ICD-10-CM | POA: Diagnosis not present

## 2018-04-30 DIAGNOSIS — N319 Neuromuscular dysfunction of bladder, unspecified: Secondary | ICD-10-CM | POA: Diagnosis not present

## 2018-04-30 DIAGNOSIS — R0902 Hypoxemia: Secondary | ICD-10-CM | POA: Diagnosis not present

## 2018-04-30 DIAGNOSIS — I952 Hypotension due to drugs: Secondary | ICD-10-CM | POA: Diagnosis not present

## 2018-04-30 DIAGNOSIS — T83518A Infection and inflammatory reaction due to other urinary catheter, initial encounter: Secondary | ICD-10-CM | POA: Diagnosis not present

## 2018-04-30 DIAGNOSIS — R404 Transient alteration of awareness: Secondary | ICD-10-CM | POA: Diagnosis not present

## 2018-04-30 DIAGNOSIS — Z885 Allergy status to narcotic agent status: Secondary | ICD-10-CM | POA: Diagnosis not present

## 2018-04-30 DIAGNOSIS — Z880 Allergy status to penicillin: Secondary | ICD-10-CM | POA: Diagnosis not present

## 2018-04-30 DIAGNOSIS — Z66 Do not resuscitate: Secondary | ICD-10-CM | POA: Diagnosis not present

## 2018-04-30 DIAGNOSIS — M79605 Pain in left leg: Secondary | ICD-10-CM | POA: Diagnosis not present

## 2018-04-30 DIAGNOSIS — G9520 Unspecified cord compression: Secondary | ICD-10-CM | POA: Diagnosis not present

## 2018-04-30 DIAGNOSIS — K5900 Constipation, unspecified: Secondary | ICD-10-CM | POA: Diagnosis not present

## 2018-04-30 DIAGNOSIS — Z981 Arthrodesis status: Secondary | ICD-10-CM | POA: Diagnosis not present

## 2018-04-30 DIAGNOSIS — S12300A Unspecified displaced fracture of fourth cervical vertebra, initial encounter for closed fracture: Secondary | ICD-10-CM | POA: Diagnosis not present

## 2018-04-30 DIAGNOSIS — R531 Weakness: Secondary | ICD-10-CM | POA: Diagnosis not present

## 2018-04-30 DIAGNOSIS — Z960 Presence of urogenital implants: Secondary | ICD-10-CM | POA: Diagnosis not present

## 2018-04-30 DIAGNOSIS — R410 Disorientation, unspecified: Secondary | ICD-10-CM | POA: Diagnosis not present

## 2018-04-30 DIAGNOSIS — E119 Type 2 diabetes mellitus without complications: Secondary | ICD-10-CM | POA: Diagnosis not present

## 2018-04-30 DIAGNOSIS — R4182 Altered mental status, unspecified: Secondary | ICD-10-CM | POA: Diagnosis not present

## 2018-05-11 DIAGNOSIS — K5909 Other constipation: Secondary | ICD-10-CM | POA: Diagnosis not present

## 2018-05-11 DIAGNOSIS — G8250 Quadriplegia, unspecified: Secondary | ICD-10-CM | POA: Diagnosis not present

## 2018-05-11 DIAGNOSIS — E1169 Type 2 diabetes mellitus with other specified complication: Secondary | ICD-10-CM | POA: Diagnosis not present

## 2018-05-11 DIAGNOSIS — G8252 Quadriplegia, C1-C4 incomplete: Secondary | ICD-10-CM | POA: Diagnosis not present

## 2018-05-11 DIAGNOSIS — S12500D Unspecified displaced fracture of sixth cervical vertebra, subsequent encounter for fracture with routine healing: Secondary | ICD-10-CM | POA: Diagnosis not present

## 2018-05-11 DIAGNOSIS — G7281 Critical illness myopathy: Secondary | ICD-10-CM | POA: Diagnosis not present

## 2018-05-11 DIAGNOSIS — S22049D Unspecified fracture of fourth thoracic vertebra, subsequent encounter for fracture with routine healing: Secondary | ICD-10-CM | POA: Diagnosis not present

## 2018-05-11 DIAGNOSIS — S12300D Unspecified displaced fracture of fourth cervical vertebra, subsequent encounter for fracture with routine healing: Secondary | ICD-10-CM | POA: Diagnosis not present

## 2018-05-11 DIAGNOSIS — K5900 Constipation, unspecified: Secondary | ICD-10-CM | POA: Diagnosis not present

## 2018-05-11 DIAGNOSIS — I82412 Acute embolism and thrombosis of left femoral vein: Secondary | ICD-10-CM | POA: Diagnosis not present

## 2018-05-11 DIAGNOSIS — E1140 Type 2 diabetes mellitus with diabetic neuropathy, unspecified: Secondary | ICD-10-CM | POA: Diagnosis not present

## 2018-05-11 DIAGNOSIS — G8929 Other chronic pain: Secondary | ICD-10-CM | POA: Diagnosis not present

## 2018-05-11 DIAGNOSIS — R279 Unspecified lack of coordination: Secondary | ICD-10-CM | POA: Diagnosis not present

## 2018-05-11 DIAGNOSIS — S14154D Other incomplete lesion at C4 level of cervical spinal cord, subsequent encounter: Secondary | ICD-10-CM | POA: Diagnosis not present

## 2018-05-11 DIAGNOSIS — Z960 Presence of urogenital implants: Secondary | ICD-10-CM | POA: Diagnosis not present

## 2018-05-11 DIAGNOSIS — R52 Pain, unspecified: Secondary | ICD-10-CM | POA: Diagnosis not present

## 2018-05-11 DIAGNOSIS — N319 Neuromuscular dysfunction of bladder, unspecified: Secondary | ICD-10-CM | POA: Diagnosis not present

## 2018-05-11 DIAGNOSIS — E039 Hypothyroidism, unspecified: Secondary | ICD-10-CM | POA: Diagnosis not present

## 2018-05-11 DIAGNOSIS — I952 Hypotension due to drugs: Secondary | ICD-10-CM | POA: Diagnosis not present

## 2018-05-11 DIAGNOSIS — Z794 Long term (current) use of insulin: Secondary | ICD-10-CM | POA: Diagnosis not present

## 2018-05-11 DIAGNOSIS — E876 Hypokalemia: Secondary | ICD-10-CM | POA: Diagnosis not present

## 2018-05-11 DIAGNOSIS — I82432 Acute embolism and thrombosis of left popliteal vein: Secondary | ICD-10-CM | POA: Diagnosis not present

## 2018-05-11 DIAGNOSIS — R809 Proteinuria, unspecified: Secondary | ICD-10-CM | POA: Diagnosis not present

## 2018-05-11 DIAGNOSIS — E1165 Type 2 diabetes mellitus with hyperglycemia: Secondary | ICD-10-CM | POA: Diagnosis not present

## 2018-05-11 DIAGNOSIS — S22029D Unspecified fracture of second thoracic vertebra, subsequent encounter for fracture with routine healing: Secondary | ICD-10-CM | POA: Diagnosis not present

## 2018-05-11 DIAGNOSIS — E785 Hyperlipidemia, unspecified: Secondary | ICD-10-CM | POA: Diagnosis not present

## 2018-05-11 DIAGNOSIS — E1129 Type 2 diabetes mellitus with other diabetic kidney complication: Secondary | ICD-10-CM | POA: Diagnosis not present

## 2018-05-11 DIAGNOSIS — S12400D Unspecified displaced fracture of fifth cervical vertebra, subsequent encounter for fracture with routine healing: Secondary | ICD-10-CM | POA: Diagnosis not present

## 2018-05-11 DIAGNOSIS — R0902 Hypoxemia: Secondary | ICD-10-CM | POA: Diagnosis not present

## 2018-05-11 DIAGNOSIS — Z743 Need for continuous supervision: Secondary | ICD-10-CM | POA: Diagnosis not present

## 2018-05-11 DIAGNOSIS — A419 Sepsis, unspecified organism: Secondary | ICD-10-CM | POA: Diagnosis not present

## 2018-05-12 MED ORDER — ACETAMINOPHEN 325 MG PO TABS
650.00 | ORAL_TABLET | ORAL | Status: DC
Start: ? — End: 2018-05-12

## 2018-05-12 MED ORDER — PANTOPRAZOLE SODIUM 40 MG PO TBEC
40.00 | DELAYED_RELEASE_TABLET | ORAL | Status: DC
Start: 2018-05-12 — End: 2018-05-12

## 2018-05-12 MED ORDER — ENOXAPARIN SODIUM 40 MG/0.4ML ~~LOC~~ SOLN
40.00 | SUBCUTANEOUS | Status: DC
Start: 2018-05-12 — End: 2018-05-12

## 2018-05-12 MED ORDER — INSULIN GLARGINE 100 UNIT/ML ~~LOC~~ SOLN
1.00 | SUBCUTANEOUS | Status: DC
Start: 2018-05-11 — End: 2018-05-12

## 2018-05-12 MED ORDER — DULOXETINE HCL 60 MG PO CPEP
60.00 | ORAL_CAPSULE | ORAL | Status: DC
Start: 2018-05-12 — End: 2018-05-12

## 2018-05-12 MED ORDER — GENERIC EXTERNAL MEDICATION
Status: DC
Start: ? — End: 2018-05-12

## 2018-05-12 MED ORDER — OXYCODONE HCL 5 MG PO TABS
5.00 | ORAL_TABLET | ORAL | Status: DC
Start: ? — End: 2018-05-12

## 2018-05-12 MED ORDER — SENNA-DOCUSATE SODIUM 8.6-50 MG PO TABS
2.00 | ORAL_TABLET | ORAL | Status: DC
Start: 2018-05-11 — End: 2018-05-12

## 2018-05-12 MED ORDER — NITROGLYCERIN 0.4 MG SL SUBL
.40 | SUBLINGUAL_TABLET | SUBLINGUAL | Status: DC
Start: ? — End: 2018-05-12

## 2018-05-12 MED ORDER — TIZANIDINE HCL 4 MG PO TABS
2.00 | ORAL_TABLET | ORAL | Status: DC
Start: ? — End: 2018-05-12

## 2018-05-12 MED ORDER — INSULIN LISPRO 100 UNIT/ML ~~LOC~~ SOLN
1.00 | SUBCUTANEOUS | Status: DC
Start: 2018-05-11 — End: 2018-05-12

## 2018-05-12 MED ORDER — DOXEPIN HCL 10 MG PO CAPS
10.00 | ORAL_CAPSULE | ORAL | Status: DC
Start: 2018-05-11 — End: 2018-05-12

## 2018-05-12 MED ORDER — INSULIN GLARGINE 100 UNIT/ML ~~LOC~~ SOLN
1.00 | SUBCUTANEOUS | Status: DC
Start: ? — End: 2018-05-12

## 2018-05-12 MED ORDER — BISACODYL 10 MG RE SUPP
10.00 | RECTAL | Status: DC
Start: 2018-05-12 — End: 2018-05-12

## 2018-05-12 MED ORDER — GABAPENTIN 300 MG PO CAPS
900.00 | ORAL_CAPSULE | ORAL | Status: DC
Start: 2018-05-12 — End: 2018-05-12

## 2018-05-12 MED ORDER — MELATONIN 3 MG PO TABS
6.00 | ORAL_TABLET | ORAL | Status: DC
Start: 2018-05-11 — End: 2018-05-12

## 2018-05-12 MED ORDER — ALUM & MAG HYDROXIDE-SIMETH 200-200-20 MG/5ML PO SUSP
30.00 | ORAL | Status: DC
Start: ? — End: 2018-05-12

## 2018-05-12 MED ORDER — CLONAZEPAM 0.5 MG PO TABS
.25 | ORAL_TABLET | ORAL | Status: DC
Start: ? — End: 2018-05-12

## 2018-05-12 MED ORDER — MIDODRINE HCL 5 MG PO TABS
5.00 | ORAL_TABLET | ORAL | Status: DC
Start: 2018-05-12 — End: 2018-05-12

## 2018-05-12 MED ORDER — INSULIN LISPRO 100 UNIT/ML ~~LOC~~ SOLN
1.00 | SUBCUTANEOUS | Status: DC
Start: ? — End: 2018-05-12

## 2018-05-12 MED ORDER — SODIUM CHLORIDE 0.9 % IV SOLN
10.00 | INTRAVENOUS | Status: DC
Start: ? — End: 2018-05-12

## 2018-05-12 MED ORDER — LEVOTHYROXINE SODIUM 88 MCG PO TABS
88.00 | ORAL_TABLET | ORAL | Status: DC
Start: 2018-05-11 — End: 2018-05-12

## 2018-05-15 ENCOUNTER — Other Ambulatory Visit: Payer: Self-pay

## 2018-05-15 NOTE — Patient Outreach (Signed)
[facility]) Care Management  05/15/2018  Sharhonda Stockwell 1940-06-12 [id number]  Transition of care  Referral date: 05/15/18 Referral source: discharged from [facility] on 05/11/18 Insurance: [employer]  Attempt #1   Telephone call to patient regarding transition of care referral. Unable to reach patient. HIPAA compliant voice message left with call back phone number.   PLAN: RNCM will attempt 2nd telephone call to patient within 4 business days. RNCM will send outreach letter.   [name] RN,BSN, CCM [facility] Telephonic  [phone]

## 2018-05-17 ENCOUNTER — Other Ambulatory Visit: Payer: Self-pay

## 2018-05-17 NOTE — Patient Outreach (Signed)
[facility]) Care Management  05/17/2018  Ilany Tardie 28-Jun-1940 [id number]  Transition of care  Referral date: 05/15/18 Referral source: discharged from [facility] on 05/11/18 Insurance: [employer]  Attempt #2  Telephone call to patient regarding transition of care referral. Unable to reach patient. HIPAA compliant voice message left with call back phone number.   PLAN:  RNCM will attempt 3rd telephone call within 4 business days.   [name] RN,BSN,CCM [facility] Telephonic  [phone]

## 2018-05-18 ENCOUNTER — Other Ambulatory Visit: Payer: Self-pay

## 2018-05-18 NOTE — Patient Outreach (Signed)
[facility]) Care Management  05/18/2018  Veronica [name] Aug 21, 1940 [id number]  Transition of care  Referral date:05/15/18 Referral source:discharged from [facility] on 05/11/18 Insurance:[employer]  Attempt #3  Telephone call to patient regarding transition of care referral. Unable to reach patient. HIPAA compliant voice message left with call back phone number.   PLAN:  If no return call will proceed with closure  [name] RN,BSN,CCM [facility] Telephonic  [phone]

## 2018-05-28 ENCOUNTER — Other Ambulatory Visit: Payer: Self-pay

## 2018-05-28 NOTE — Patient Outreach (Signed)
[facility]) Care Management  05/28/2018  Veronica [name] [birth year] [id number]  Case closure:   Transition of care  Referral date:05/15/18 Referral source:discharged from [facility] on 05/11/18 Insurance:[employer]  No response after 3 telephone calls and outreach letter attempt.  PLAN: RNCM will close patient due to being unable to reach.  RNCM will send closure notification to patient's primary MD   [name] RN,BSN,CCM [facility] Telephonic  [phone]

## 2018-06-03 DIAGNOSIS — Z9689 Presence of other specified functional implants: Secondary | ICD-10-CM | POA: Diagnosis not present

## 2018-06-03 DIAGNOSIS — F329 Major depressive disorder, single episode, unspecified: Secondary | ICD-10-CM | POA: Diagnosis not present

## 2018-06-03 DIAGNOSIS — S14115D Complete lesion at C5 level of cervical spinal cord, subsequent encounter: Secondary | ICD-10-CM | POA: Diagnosis not present

## 2018-06-03 DIAGNOSIS — E039 Hypothyroidism, unspecified: Secondary | ICD-10-CM | POA: Diagnosis not present

## 2018-06-03 DIAGNOSIS — G8253 Quadriplegia, C5-C7 complete: Secondary | ICD-10-CM | POA: Diagnosis not present

## 2018-06-03 DIAGNOSIS — E119 Type 2 diabetes mellitus without complications: Secondary | ICD-10-CM | POA: Diagnosis not present

## 2018-06-03 DIAGNOSIS — E11319 Type 2 diabetes mellitus with unspecified diabetic retinopathy without macular edema: Secondary | ICD-10-CM | POA: Diagnosis not present

## 2018-06-03 DIAGNOSIS — D649 Anemia, unspecified: Secondary | ICD-10-CM | POA: Diagnosis not present

## 2018-06-03 DIAGNOSIS — F419 Anxiety disorder, unspecified: Secondary | ICD-10-CM | POA: Diagnosis not present

## 2018-06-04 ENCOUNTER — Ambulatory Visit (INDEPENDENT_AMBULATORY_CARE_PROVIDER_SITE_OTHER): Payer: Medicare HMO | Admitting: Osteopathic Medicine

## 2018-06-04 ENCOUNTER — Other Ambulatory Visit: Payer: Self-pay

## 2018-06-04 ENCOUNTER — Telehealth: Payer: Self-pay | Admitting: Osteopathic Medicine

## 2018-06-04 ENCOUNTER — Encounter: Payer: Self-pay | Admitting: Osteopathic Medicine

## 2018-06-04 VITALS — BP 106/68 | HR 68 | Temp 97.8°F | Wt 128.0 lb

## 2018-06-04 DIAGNOSIS — R3981 Functional urinary incontinence: Secondary | ICD-10-CM

## 2018-06-04 DIAGNOSIS — G8253 Quadriplegia, C5-C7 complete: Secondary | ICD-10-CM | POA: Diagnosis not present

## 2018-06-04 DIAGNOSIS — IMO0002 Reserved for concepts with insufficient information to code with codable children: Secondary | ICD-10-CM

## 2018-06-04 DIAGNOSIS — F329 Major depressive disorder, single episode, unspecified: Secondary | ICD-10-CM | POA: Diagnosis not present

## 2018-06-04 DIAGNOSIS — Z09 Encounter for follow-up examination after completed treatment for conditions other than malignant neoplasm: Secondary | ICD-10-CM

## 2018-06-04 DIAGNOSIS — F419 Anxiety disorder, unspecified: Secondary | ICD-10-CM | POA: Diagnosis not present

## 2018-06-04 DIAGNOSIS — D649 Anemia, unspecified: Secondary | ICD-10-CM | POA: Diagnosis not present

## 2018-06-04 DIAGNOSIS — E119 Type 2 diabetes mellitus without complications: Secondary | ICD-10-CM | POA: Diagnosis not present

## 2018-06-04 DIAGNOSIS — S14115D Complete lesion at C5 level of cervical spinal cord, subsequent encounter: Secondary | ICD-10-CM | POA: Diagnosis not present

## 2018-06-04 DIAGNOSIS — E11319 Type 2 diabetes mellitus with unspecified diabetic retinopathy without macular edema: Secondary | ICD-10-CM | POA: Diagnosis not present

## 2018-06-04 DIAGNOSIS — E039 Hypothyroidism, unspecified: Secondary | ICD-10-CM | POA: Diagnosis not present

## 2018-06-04 DIAGNOSIS — G8250 Quadriplegia, unspecified: Secondary | ICD-10-CM

## 2018-06-04 DIAGNOSIS — Z9689 Presence of other specified functional implants: Secondary | ICD-10-CM | POA: Diagnosis not present

## 2018-06-04 MED ORDER — OXYCODONE HCL 5 MG PO TABS: 5.0000 mg | ORAL_TABLET | Freq: Three times a day (TID) | ORAL | 0 refills | Status: DC | PRN

## 2018-06-04 MED ORDER — GABAPENTIN 300 MG PO CAPS: 600.0000 mg | ORAL_CAPSULE | Freq: Three times a day (TID) | ORAL | 2 refills | Status: DC

## 2018-06-04 MED ORDER — BACLOFEN 5 MG PO TABS: 5.0000 mg | ORAL_TABLET | Freq: Three times a day (TID) | ORAL | 1 refills | Status: DC | PRN

## 2018-06-04 NOTE — Progress Notes (Signed)
Pt moved to later slot, see other progress note from same day

## 2018-06-04 NOTE — Progress Notes (Addendum)
Virtual Visit  via Video or Phone Note  I connected with      [name] on 06/04/18 at 2:00 by a telemedicine application and verified that I am speaking with the correct person using two identifiers.   I discussed the limitations of evaluation and management by telemedicine and the availability of in person appointments. The patient expressed understanding and agreed to proceed.  History of Present Illness: Veronica [name] is a 78 y.o. female who would like to discuss  No chief complaint on file.  Records reviewed: admitted 11 days (04/30/18-05/11/18) at [facility] for sepsis, neurogenic bowel/bladder, quadriplegia C1-C4 incomplete s/p spinal cord injury d/t fall w/ cervical spinal fusion surgery 02/2018  Currently at home as of yesterday. Husband and home health are helping care for her.   Pt reports her main issue at this point is recovering from sepsis d/t UTI, was feeling better but now having some burning in vaginal/urethral area, no apparent rash.   Shooting pains in legs and arms, throughout the day, no triggers. Taking oxycodone IR 5 mg taking bid as needed. Requests refill on this. She is also on Gabapentin 300 mg pills, taking 900 bid.   Husband assists with history     Observations/Objective: BP 106/68   Pulse 68   Temp 97.8 F (36.6 C) (Oral)   Wt 128 lb (58.1 kg)   BMI 20.05 kg/m  BP Readings from Last 3 Encounters:  06/04/18 106/68  11/07/17 (!) 158/88  10/24/17 (!) 148/87   Exam: Normal Speech.    Lab and Radiology Results No results found for this or any previous visit (from the past 72 hour(s)). No results found.     Assessment and Plan: 78 y.o. female with The primary encounter diagnosis was Quadriplegia following spinal cord injury (HCC). A diagnosis of Functional urinary incontinence was also pertinent to this visit.  Will adjust Rx for gabapentin and oxycodone, discussed sparing use of oxycodone, I am hopeful we can get the neuropathic-type  pain controlled w/ gabapentin and baclofen   Addendum 08/17/18 and 09/04/18: PureWick system  Frequency of need: insert/apply one in evening for use overnight, remove and discard in the morning, insert/apply new device following evening  Medical necessity: functional incontinence d/t quadriplegia / spinal cord injury, PureWick to increase comfort, sanitation, prevention of infection and skin irritation  IF ANY OTHER DOCUMENTATION IS NEEDED TO JUSTIFY THIS MEDICAL EQUIPMENT, PLEASE LET ME KNOW.      PDMP reviewed during this encounter. No orders of the defined types were placed in this encounter.  Meds ordered this encounter  Medications  . gabapentin (NEURONTIN) 300 MG capsule    Sig: Take 2-3 capsules (600-900 mg total) by mouth 3 (three) times daily.    Dispense:  270 capsule    Refill:  2  . oxyCODONE (OXY IR/ROXICODONE) 5 MG immediate release tablet    Sig: Take 1 tablet (5 mg total) by mouth every 8 (eight) hours as needed for severe pain or breakthrough pain.    Dispense:  90 tablet    Refill:  0  . Baclofen 5 MG TABS    Sig: Take 5 mg by mouth 3 (three) times daily as needed.    Dispense:  90 tablet    Refill:  1   There are no Patient Instructions on file for this visit. Instructions sent via MyChart. If MyChart not available, pt was given option for info via personal e-mail w/ no guarantee of protected health info over unsecured  e-mail communication, and MyChart sign-up instructions were included.   Follow Up Instructions: Return in about 2 weeks (around 06/18/2018) for telemedicine visit pain management .    I discussed the assessment and treatment plan with the patient. The patient was provided an opportunity to ask questions and all were answered. The patient agreed with the plan and demonstrated an understanding of the instructions.   The patient was advised to call back or seek an in-person evaluation if the symptoms worsen or if the condition fails to improve as  anticipated.  I provided 25 minutes of non-face-to-face time during this encounter.                      Historical information moved to improve visibility of documentation.  Past Medical History:  Diagnosis Date  . Anemia   . Diabetic retinopathy (HCC) 03/23/2015  . GERD (gastroesophageal reflux disease) 01/05/2015  . Hypothyroid   . Osteopenia 03/03/2014  . Type 2 diabetes mellitus with microalbuminuria or microproteinuria 03/03/2014   Actos added January 2016.     Past Surgical History:  Procedure Laterality Date  . APPENDECTOMY  02/28/11  . CESAREAN SECTION    . LAPAROSCOPIC APPENDECTOMY  03/03/2011   Procedure: APPENDECTOMY LAPAROSCOPIC;  Surgeon: Valarie Merino, MD;  Location: [facility];  Service: General;  Laterality: N/A;  . TONSILLECTOMY     Social History   Tobacco Use  . Smoking status: Never Smoker  . Smokeless tobacco: Never Used  Substance Use Topics  . Alcohol use: No   family history includes Cancer in her father and mother; Diabetes in her mother; Hypertension in her mother; Stroke in her mother.  Medications: Current Outpatient Medications  Medication Sig Dispense Refill  . acetaminophen (TYLENOL) 500 MG tablet Take by mouth.    . Baclofen 5 MG TABS Take 5 mg by mouth 3 (three) times daily as needed. 90 tablet 1  . bisacodyl (DULCOLAX) 10 MG suppository Place rectally.    . carvedilol (COREG) 6.25 MG tablet     . clonazePAM (KLONOPIN) 0.5 MG tablet Take 1 tablet (0.5 mg total) by mouth 3 (three) times daily as needed (#90 for thirty days). for anxiety 90 tablet 2  . cyclobenzaprine (FLEXERIL) 10 MG tablet Take 0.5-1 tablets (5-10 mg total) by mouth 3 (three) times daily as needed for muscle spasms. Caution: can cause drowsiness 60 tablet 1  . doxepin (SINEQUAN) 10 MG capsule Take by mouth.    . DULoxetine (CYMBALTA) 60 MG capsule Take by mouth.    Everlene Balls 5 MG TABS tablet     . gabapentin (NEURONTIN) 300 MG capsule Take 2-3 capsules  (600-900 mg total) by mouth 3 (three) times daily. 270 capsule 2  . insulin degludec (TRESIBA) 100 UNIT/ML SOPN FlexTouch Pen Inject into the skin.    [name] levothyroxine (SYNTHROID, LEVOTHROID) 88 MCG tablet Take 1 tablet (88 mcg total) by mouth daily. FOLLOWING WITH ENDOCRINOLOGY DR LEVY 1 tablet 0  . oxyCODONE (OXY IR/ROXICODONE) 5 MG immediate release tablet Take 1 tablet (5 mg total) by mouth every 8 (eight) hours as needed for severe pain or breakthrough pain. 90 tablet 0  . tiZANidine (ZANAFLEX) 4 MG tablet     . insulin aspart (FIASP FLEXTOUCH) 100 UNIT/ML FlexPen Inject into the skin.    [name] insulin NPH Human (HUMULIN N,NOVOLIN N) 100 UNIT/ML injection Inject into the skin.    [name] insulin regular (NOVOLIN R,HUMULIN R) 100 units/mL injection Inject into the skin.    [name]  metFORMIN (GLUCOPHAGE-XR) 500 MG 24 hr tablet Take by mouth. FOLLOWING W/ ENDOCRINOLOGY DR. Shawnee KnappLEVY    . ranitidine (ZANTAC) 300 MG capsule Take 1 capsule (300 mg total) by mouth every evening. (Patient not taking: Reported on 06/04/2018) 90 capsule 3   No current facility-administered medications for this visit.    Allergies  Allergen Reactions  . Dulaglutide Palpitations    SOB. N/V  . Cephalexin Other (See Comments)    hives   . Codeine Hives and Nausea And Vomiting  . Glimepiride Hives  . Penicillins Rash  . Sitagliptin Other (See Comments)    Insomnia   . Sulfamethoxazole-Trimethoprim Hives  . Ezetimibe   . Guaifenesin   . Metformin And Related     diarrhea  . Other Other (See Comments)    All oral diabetic drugs: nausea, vomiting, dizziness Unknown antibiotic: "starts with a P" - rash  . Penicillin G Itching  . Pseudoephedrine Hcl   . Sulfa Antibiotics Hives and Nausea And Vomiting  . Sulfamethoxazole Itching    PDMP reviewed during this encounter. No orders of the defined types were placed in this encounter.  Meds ordered this encounter  Medications  . gabapentin (NEURONTIN) 300 MG capsule    Sig: Take  2-3 capsules (600-900 mg total) by mouth 3 (three) times daily.    Dispense:  270 capsule    Refill:  2  . oxyCODONE (OXY IR/ROXICODONE) 5 MG immediate release tablet    Sig: Take 1 tablet (5 mg total) by mouth every 8 (eight) hours as needed for severe pain or breakthrough pain.    Dispense:  90 tablet    Refill:  0  . Baclofen 5 MG TABS    Sig: Take 5 mg by mouth 3 (three) times daily as needed.    Dispense:  90 tablet    Refill:  1

## 2018-06-04 NOTE — Telephone Encounter (Addendum)
Attempted to call Kipp Laurence @ [facility], no answer. Left a detailed vm msg - verbal order given to proceed with order for home health PT for patient. Direct call back info provided.

## 2018-06-04 NOTE — Telephone Encounter (Signed)
Dr. Apolinar Junes from [facility] called on 06/04/2018 requesting verbal orders for Home Health PT for Ms Currie. She would like PT at Home 2 times a week for 3 weeks starting on 06/03/2018. Please have one of the CMA's call this in if you are ok with it. The number is [phone]  Arline Asp

## 2018-06-04 NOTE — Patient Outreach (Addendum)
[facility]) Care Management  06/04/2018  Veronica [name] Jun 22, 1940 [id number]   Referral Date:  06/04/2018 Referral Source: [employer] Report Date of Admission: unknown Diagnosis: Critical Myopathy Date of Discharge:06/01/2018 Facility: [facility] Insurance: [facility]  Outreach attempt: No answer. HIPAA compliant voice message left.   Plan: RN CM will attempt patient again within 4 days and send letter.   Bary Leriche, RN, MSN [facility] Care Management Care Management Coordinator Direct Line [phone] Toll Free: [phone]  Fax: [phone]

## 2018-06-05 ENCOUNTER — Other Ambulatory Visit: Payer: Self-pay

## 2018-06-05 ENCOUNTER — Telehealth: Payer: Self-pay | Admitting: Osteopathic Medicine

## 2018-06-05 ENCOUNTER — Telehealth: Payer: Self-pay

## 2018-06-05 DIAGNOSIS — R3981 Functional urinary incontinence: Secondary | ICD-10-CM

## 2018-06-05 NOTE — Telephone Encounter (Signed)
-----   Message from Sunnie Nielsen, DO sent at 06/04/2018  4:27 PM EDT ----- Can't remember if I sent already but pt needs f/u 2 weeks televisit for pain management! Thanks!

## 2018-06-05 NOTE — Patient Outreach (Signed)
[facility]) Care Management  06/05/2018  Veronica [name] [birth year] [id number]   Referral Date:  06/04/2018 Referral Source: [employer] Report Date of Admission: unknown Diagnosis: Critical Myopathy Date of Discharge:06/01/2018 Facility: [facility] Insurance: [facility]  Outreach attempt: No answer. HIPAA compliant voice message left.   Plan: RN CM will attempt patient again within 4 days.  Bary Leriche, RN, MSN [facility] Care Management Care Management Coordinator Direct Line [phone] Cell [phone] Toll Free: [phone]  Fax: [phone]

## 2018-06-05 NOTE — Telephone Encounter (Signed)
Orders printed, can go ahead and fax! Thanks!

## 2018-06-05 NOTE — Telephone Encounter (Signed)
PT Veronica [name] called requesting a doctor's order for urinalysis & culture. Requesting for order to be faxed to [phone] to ensure it gets completed by the proper department.

## 2018-06-05 NOTE — Telephone Encounter (Signed)
Left message with information below and for patient to call us back to schedule an appointment. °

## 2018-06-05 NOTE — Telephone Encounter (Signed)
OT Rob from [facility] called requesting verbal order for pt. As per Rob, requesting at home OT for 2x times a week, for 4 wks starting today. Returned a call back, no answer. Left a detailed vm msg authorizing the verbal ok to proceed with at home OT for pt. Direct call back info provided.

## 2018-06-05 NOTE — Telephone Encounter (Signed)
Lab order request faxed to [facility] at [phone]. Confirmation rec'd.

## 2018-06-06 ENCOUNTER — Other Ambulatory Visit: Payer: Self-pay

## 2018-06-06 NOTE — Patient Outreach (Signed)
[facility]) Care Management  06/06/2018  Veronica [name] [birth year] [id number]   Referral Date:06/04/2018 Referral Source:[employer] Report Date of Admission:unknown Diagnosis:Critical Myopathy Date of Discharge:06/01/2018 Facility:[facility] Insurance:[employer]  Outreach attempt:No answer. HIPAA compliant voice message left.  Plan: RN CM willwait return call. If no return call will close case.  Bary Leriche, RN, MSN [facility] Care Management Care Management Coordinator Direct Line [phone] Cell [phone] Toll Free: [phone]  Fax: [phone]

## 2018-06-07 ENCOUNTER — Encounter: Payer: Self-pay | Admitting: Osteopathic Medicine

## 2018-06-07 DIAGNOSIS — E11319 Type 2 diabetes mellitus with unspecified diabetic retinopathy without macular edema: Secondary | ICD-10-CM | POA: Diagnosis not present

## 2018-06-07 DIAGNOSIS — F419 Anxiety disorder, unspecified: Secondary | ICD-10-CM | POA: Diagnosis not present

## 2018-06-07 DIAGNOSIS — D649 Anemia, unspecified: Secondary | ICD-10-CM | POA: Diagnosis not present

## 2018-06-07 DIAGNOSIS — Z9689 Presence of other specified functional implants: Secondary | ICD-10-CM | POA: Diagnosis not present

## 2018-06-07 DIAGNOSIS — F329 Major depressive disorder, single episode, unspecified: Secondary | ICD-10-CM | POA: Diagnosis not present

## 2018-06-07 DIAGNOSIS — G8253 Quadriplegia, C5-C7 complete: Secondary | ICD-10-CM | POA: Diagnosis not present

## 2018-06-07 DIAGNOSIS — E119 Type 2 diabetes mellitus without complications: Secondary | ICD-10-CM | POA: Diagnosis not present

## 2018-06-07 DIAGNOSIS — E039 Hypothyroidism, unspecified: Secondary | ICD-10-CM | POA: Diagnosis not present

## 2018-06-07 DIAGNOSIS — S14115D Complete lesion at C5 level of cervical spinal cord, subsequent encounter: Secondary | ICD-10-CM | POA: Diagnosis not present

## 2018-06-08 DIAGNOSIS — T68XXXA Hypothermia, initial encounter: Secondary | ICD-10-CM | POA: Diagnosis not present

## 2018-06-08 DIAGNOSIS — Z9689 Presence of other specified functional implants: Secondary | ICD-10-CM | POA: Diagnosis not present

## 2018-06-08 DIAGNOSIS — E785 Hyperlipidemia, unspecified: Secondary | ICD-10-CM | POA: Diagnosis not present

## 2018-06-08 DIAGNOSIS — E119 Type 2 diabetes mellitus without complications: Secondary | ICD-10-CM | POA: Diagnosis not present

## 2018-06-08 DIAGNOSIS — R4182 Altered mental status, unspecified: Secondary | ICD-10-CM | POA: Diagnosis not present

## 2018-06-08 DIAGNOSIS — A419 Sepsis, unspecified organism: Secondary | ICD-10-CM | POA: Diagnosis not present

## 2018-06-08 DIAGNOSIS — N3001 Acute cystitis with hematuria: Secondary | ICD-10-CM | POA: Diagnosis not present

## 2018-06-08 DIAGNOSIS — K5900 Constipation, unspecified: Secondary | ICD-10-CM | POA: Diagnosis not present

## 2018-06-08 DIAGNOSIS — N319 Neuromuscular dysfunction of bladder, unspecified: Secondary | ICD-10-CM | POA: Diagnosis not present

## 2018-06-08 DIAGNOSIS — N3000 Acute cystitis without hematuria: Secondary | ICD-10-CM | POA: Diagnosis not present

## 2018-06-08 DIAGNOSIS — E1142 Type 2 diabetes mellitus with diabetic polyneuropathy: Secondary | ICD-10-CM | POA: Diagnosis not present

## 2018-06-08 DIAGNOSIS — R7989 Other specified abnormal findings of blood chemistry: Secondary | ICD-10-CM | POA: Diagnosis not present

## 2018-06-08 DIAGNOSIS — G8252 Quadriplegia, C1-C4 incomplete: Secondary | ICD-10-CM | POA: Diagnosis not present

## 2018-06-08 DIAGNOSIS — G8253 Quadriplegia, C5-C7 complete: Secondary | ICD-10-CM | POA: Diagnosis not present

## 2018-06-08 DIAGNOSIS — R001 Bradycardia, unspecified: Secondary | ICD-10-CM | POA: Diagnosis not present

## 2018-06-08 DIAGNOSIS — F329 Major depressive disorder, single episode, unspecified: Secondary | ICD-10-CM | POA: Diagnosis not present

## 2018-06-08 DIAGNOSIS — S14115D Complete lesion at C5 level of cervical spinal cord, subsequent encounter: Secondary | ICD-10-CM | POA: Diagnosis not present

## 2018-06-08 DIAGNOSIS — I959 Hypotension, unspecified: Secondary | ICD-10-CM | POA: Diagnosis not present

## 2018-06-08 DIAGNOSIS — R42 Dizziness and giddiness: Secondary | ICD-10-CM | POA: Diagnosis not present

## 2018-06-08 DIAGNOSIS — R680 Hypothermia, not associated with low environmental temperature: Secondary | ICD-10-CM | POA: Diagnosis not present

## 2018-06-08 DIAGNOSIS — W19XXXA Unspecified fall, initial encounter: Secondary | ICD-10-CM | POA: Diagnosis not present

## 2018-06-08 DIAGNOSIS — R109 Unspecified abdominal pain: Secondary | ICD-10-CM | POA: Diagnosis not present

## 2018-06-08 DIAGNOSIS — F419 Anxiety disorder, unspecified: Secondary | ICD-10-CM | POA: Diagnosis not present

## 2018-06-08 DIAGNOSIS — E876 Hypokalemia: Secondary | ICD-10-CM | POA: Diagnosis not present

## 2018-06-08 DIAGNOSIS — E1140 Type 2 diabetes mellitus with diabetic neuropathy, unspecified: Secondary | ICD-10-CM | POA: Diagnosis not present

## 2018-06-08 DIAGNOSIS — E1169 Type 2 diabetes mellitus with other specified complication: Secondary | ICD-10-CM | POA: Diagnosis not present

## 2018-06-08 DIAGNOSIS — A4189 Other specified sepsis: Secondary | ICD-10-CM | POA: Diagnosis not present

## 2018-06-08 DIAGNOSIS — E11319 Type 2 diabetes mellitus with unspecified diabetic retinopathy without macular edema: Secondary | ICD-10-CM | POA: Diagnosis not present

## 2018-06-08 DIAGNOSIS — R252 Cramp and spasm: Secondary | ICD-10-CM | POA: Diagnosis not present

## 2018-06-08 DIAGNOSIS — E039 Hypothyroidism, unspecified: Secondary | ICD-10-CM | POA: Diagnosis not present

## 2018-06-08 DIAGNOSIS — D649 Anemia, unspecified: Secondary | ICD-10-CM | POA: Diagnosis not present

## 2018-06-12 DIAGNOSIS — S14115D Complete lesion at C5 level of cervical spinal cord, subsequent encounter: Secondary | ICD-10-CM | POA: Diagnosis not present

## 2018-06-12 DIAGNOSIS — E119 Type 2 diabetes mellitus without complications: Secondary | ICD-10-CM | POA: Diagnosis not present

## 2018-06-12 DIAGNOSIS — E039 Hypothyroidism, unspecified: Secondary | ICD-10-CM | POA: Diagnosis not present

## 2018-06-12 DIAGNOSIS — G8253 Quadriplegia, C5-C7 complete: Secondary | ICD-10-CM | POA: Diagnosis not present

## 2018-06-12 DIAGNOSIS — F329 Major depressive disorder, single episode, unspecified: Secondary | ICD-10-CM | POA: Diagnosis not present

## 2018-06-12 DIAGNOSIS — E11319 Type 2 diabetes mellitus with unspecified diabetic retinopathy without macular edema: Secondary | ICD-10-CM | POA: Diagnosis not present

## 2018-06-12 DIAGNOSIS — F419 Anxiety disorder, unspecified: Secondary | ICD-10-CM | POA: Diagnosis not present

## 2018-06-12 DIAGNOSIS — D649 Anemia, unspecified: Secondary | ICD-10-CM | POA: Diagnosis not present

## 2018-06-12 DIAGNOSIS — Z9689 Presence of other specified functional implants: Secondary | ICD-10-CM | POA: Diagnosis not present

## 2018-06-13 ENCOUNTER — Encounter: Payer: Self-pay | Admitting: Osteopathic Medicine

## 2018-06-13 ENCOUNTER — Ambulatory Visit (INDEPENDENT_AMBULATORY_CARE_PROVIDER_SITE_OTHER): Payer: Medicare HMO | Admitting: Osteopathic Medicine

## 2018-06-13 VITALS — BP 110/82 | HR 82

## 2018-06-13 DIAGNOSIS — G8250 Quadriplegia, unspecified: Secondary | ICD-10-CM | POA: Diagnosis not present

## 2018-06-13 DIAGNOSIS — IMO0002 Reserved for concepts with insufficient information to code with codable children: Secondary | ICD-10-CM

## 2018-06-13 NOTE — Patient Instructions (Signed)
For pain: Tylenol up to 1000 mg 4 times per day Gabapentin 900 mg (3 pills) three times per day Baclofen 5 mg (1 pill) three times per day Oxycodone as needed up to three times per day   Please review the attached medication list and let us know if something doesn't match with what you're taking, and/or if you're taking something that's not on the list!   Thanks Take care!

## 2018-06-13 NOTE — Progress Notes (Signed)
Virtual Visit  via Phone Note  I connected with      Veronica [name] on 06/13/18 at 1:00 by a telemedicine application and verified that I am speaking with the correct person using two identifiers.   I discussed the limitations of evaluation and management by telemedicine and the availability of in person appointments. The patient expressed understanding and agreed to proceed.  History of Present Illness: Veronica [name] is a 78 y.o. female who would like to discuss follow up pain management, HFU  History obtained w/ assistance from husband.   Last visit w/ me 06/04/2018, discussed pain management. She's been on bid gabapentin, we increased this to 600 - 900 mg tid, refilled baclofen 5 mg, refilled oxycodone 5 mg IR for tid use, #90 for 30 days.   She was admitted to hospital 4 days after that on 06/08/2018 after home OT had some concerns for low BP, admitted for Dx neurogenic bladder, sepsis d/t UTI. LOS 2 days, d/c home on cipro 500 mg bid x 7 days, added Flexeril 10 mg tid prn spasms.   Pt reports she's taking the gabapentin 2 tablets tid, and it helps for the most part, she also takes tylenol. Not sure if she was taking the baclofen, zanaflex, flexeril... thinks it's probably the flexeril since this was what the hospital gave her.       Observations/Objective: BP 110/82   Pulse 82  BP Readings from Last 3 Encounters:  06/13/18 110/82  06/04/18 106/68  11/07/17 (!) 158/88   Exam: Normal Speech.    Lab and Radiology Results No results found for this or any previous visit (from the past 72 hour(s)). No results found.        Assessment and Plan: 78 y.o. female with The encounter diagnosis was Quadriplegia following spinal cord injury (HCC).   PDMP not reviewed this encounter. No orders of the defined types were placed in this encounter.  No orders of the defined types were placed in this encounter.  Patient Instructions  For pain: Tylenol up to 1000 mg 4  times per day Gabapentin 900 mg (3 pills) three times per day Baclofen 5 mg (1 pill) three times per day Oxycodone as needed up to three times per day   Please review the attached medication list and let us know if something doesn't match with what you're taking, and/or if you're taking something that's not on the list!   Thanks Take care!      Instructions sent via MyChart. If MyChart not available, pt was given option for info via personal e-mail w/ no guarantee of protected health info over unsecured e-mail communication, and MyChart sign-up instructions were included.   Follow Up Instructions: Return in about 3 weeks (around 07/04/2018) for follow up pain management, reach out to us sooner if needed .    I discussed the assessment and treatment plan with the patient. The patient was provided an opportunity to ask questions and all were answered. The patient agreed with the plan and demonstrated an understanding of the instructions.   The patient was advised to call back or seek an in-person evaluation if the symptoms worsen or if the condition fails to improve as anticipated.  I provided 25 minutes of non-face-to-face time during this encounter.                      Historical information moved to improve visibility of documentation.  Past Medical History:  Diagnosis Date  .  Anemia   . Diabetic retinopathy (HCC) 03/23/2015  . GERD (gastroesophageal reflux disease) 01/05/2015  . Hypothyroid   . Osteopenia 03/03/2014  . Type 2 diabetes mellitus with microalbuminuria or microproteinuria 03/03/2014   Actos added January 2016.     Past Surgical History:  Procedure Laterality Date  . APPENDECTOMY  02/28/11  . CESAREAN SECTION    . LAPAROSCOPIC APPENDECTOMY  03/03/2011   Procedure: APPENDECTOMY LAPAROSCOPIC;  Surgeon: Valarie Merino, MD;  Location: [facility];  Service: General;  Laterality: N/A;  . TONSILLECTOMY     Social History   Tobacco Use  . Smoking  status: Never Smoker  . Smokeless tobacco: Never Used  Substance Use Topics  . Alcohol use: No   family history includes Cancer in her father and mother; Diabetes in her mother; Hypertension in her mother; Stroke in her mother.  Medications: Current Outpatient Medications  Medication Sig Dispense Refill  . acetaminophen (TYLENOL) 500 MG tablet Take by mouth.    . Baclofen 5 MG TABS Take 5 mg by mouth 3 (three) times daily as needed. 90 tablet 1  . bisacodyl (DULCOLAX) 10 MG suppository Place rectally.    . carvedilol (COREG) 6.25 MG tablet     . doxepin (SINEQUAN) 10 MG capsule Take by mouth.    . DULoxetine (CYMBALTA) 60 MG capsule Take by mouth.    Everlene Balls 5 MG TABS tablet     . gabapentin (NEURONTIN) 300 MG capsule Take 2-3 capsules (600-900 mg total) by mouth 3 (three) times daily. 270 capsule 2  . insulin aspart (FIASP FLEXTOUCH) 100 UNIT/ML FlexPen Inject into the skin.    [name] insulin degludec (TRESIBA) 100 UNIT/ML SOPN FlexTouch Pen Inject into the skin.    [name] insulin NPH Human (HUMULIN N,NOVOLIN N) 100 UNIT/ML injection Inject into the skin.    [name] insulin regular (NOVOLIN R,HUMULIN R) 100 units/mL injection Inject into the skin.    [name] levothyroxine (SYNTHROID, LEVOTHROID) 88 MCG tablet Take 1 tablet (88 mcg total) by mouth daily. FOLLOWING WITH ENDOCRINOLOGY DR LEVY 1 tablet 0  . metFORMIN (GLUCOPHAGE-XR) 500 MG 24 hr tablet Take by mouth. FOLLOWING W/ ENDOCRINOLOGY DR. Shawnee Knapp    . oxyCODONE (OXY IR/ROXICODONE) 5 MG immediate release tablet Take 1 tablet (5 mg total) by mouth every 8 (eight) hours as needed for severe pain or breakthrough pain. 90 tablet 0  . ranitidine (ZANTAC) 300 MG capsule Take 1 capsule (300 mg total) by mouth every evening. 90 capsule 3  . clonazePAM (KLONOPIN) 0.5 MG tablet Take 1 tablet (0.5 mg total) by mouth 3 (three) times daily as needed (#90 for thirty days). for anxiety 90 tablet 2   No current facility-administered medications for this visit.     Allergies  Allergen Reactions  . Dulaglutide Palpitations    SOB. N/V  . Cephalexin Other (See Comments)    hives   . Codeine Hives and Nausea And Vomiting  . Glimepiride Hives  . Penicillins Rash  . Sitagliptin Other (See Comments)    Insomnia   . Sulfamethoxazole-Trimethoprim Hives  . Ezetimibe   . Guaifenesin   . Metformin And Related     diarrhea  . Other Other (See Comments)    All oral diabetic drugs: nausea, vomiting, dizziness Unknown antibiotic: "starts with a P" - rash  . Penicillin G Itching  . Pseudoephedrine Hcl   . Sulfa Antibiotics Hives and Nausea And Vomiting  . Sulfamethoxazole Itching    PDMP not reviewed this encounter. No orders  of the defined types were placed in this encounter.  No orders of the defined types were placed in this encounter.

## 2018-06-14 ENCOUNTER — Telehealth: Payer: Self-pay

## 2018-06-14 DIAGNOSIS — Z9689 Presence of other specified functional implants: Secondary | ICD-10-CM | POA: Diagnosis not present

## 2018-06-14 DIAGNOSIS — G8253 Quadriplegia, C5-C7 complete: Secondary | ICD-10-CM | POA: Diagnosis not present

## 2018-06-14 DIAGNOSIS — E119 Type 2 diabetes mellitus without complications: Secondary | ICD-10-CM | POA: Diagnosis not present

## 2018-06-14 DIAGNOSIS — S14115D Complete lesion at C5 level of cervical spinal cord, subsequent encounter: Secondary | ICD-10-CM | POA: Diagnosis not present

## 2018-06-14 DIAGNOSIS — F329 Major depressive disorder, single episode, unspecified: Secondary | ICD-10-CM | POA: Diagnosis not present

## 2018-06-14 DIAGNOSIS — F419 Anxiety disorder, unspecified: Secondary | ICD-10-CM | POA: Diagnosis not present

## 2018-06-14 DIAGNOSIS — E039 Hypothyroidism, unspecified: Secondary | ICD-10-CM | POA: Diagnosis not present

## 2018-06-14 DIAGNOSIS — E11319 Type 2 diabetes mellitus with unspecified diabetic retinopathy without macular edema: Secondary | ICD-10-CM | POA: Diagnosis not present

## 2018-06-14 DIAGNOSIS — D649 Anemia, unspecified: Secondary | ICD-10-CM | POA: Diagnosis not present

## 2018-06-14 NOTE — Telephone Encounter (Signed)
Physical therapist Nynicka from [facility] called requesting verbal order for pt. Requesting to begin in home physical therapy for 2x twice a wk for 4 wks. Verbal authorization given to proceed with physical therapy for pt. No other inquiries during call.

## 2018-06-14 NOTE — Telephone Encounter (Signed)
Occupational therapist Rob from [facility] called requesting verbal order for pt. Requesting to begin in home occupational therapy for pt starting today for 2x twice a wk for 5 wks. Verbal authorization given to proceed with occupational therapy for pt. No other inquiries during call.

## 2018-06-15 DIAGNOSIS — D649 Anemia, unspecified: Secondary | ICD-10-CM | POA: Diagnosis not present

## 2018-06-15 DIAGNOSIS — Z9689 Presence of other specified functional implants: Secondary | ICD-10-CM | POA: Diagnosis not present

## 2018-06-15 DIAGNOSIS — G8253 Quadriplegia, C5-C7 complete: Secondary | ICD-10-CM | POA: Diagnosis not present

## 2018-06-15 DIAGNOSIS — Z794 Long term (current) use of insulin: Secondary | ICD-10-CM | POA: Diagnosis not present

## 2018-06-15 DIAGNOSIS — E119 Type 2 diabetes mellitus without complications: Secondary | ICD-10-CM | POA: Diagnosis not present

## 2018-06-15 DIAGNOSIS — E11319 Type 2 diabetes mellitus with unspecified diabetic retinopathy without macular edema: Secondary | ICD-10-CM | POA: Diagnosis not present

## 2018-06-15 DIAGNOSIS — E1129 Type 2 diabetes mellitus with other diabetic kidney complication: Secondary | ICD-10-CM | POA: Diagnosis not present

## 2018-06-15 DIAGNOSIS — S14115D Complete lesion at C5 level of cervical spinal cord, subsequent encounter: Secondary | ICD-10-CM | POA: Diagnosis not present

## 2018-06-15 DIAGNOSIS — R809 Proteinuria, unspecified: Secondary | ICD-10-CM | POA: Diagnosis not present

## 2018-06-15 DIAGNOSIS — F329 Major depressive disorder, single episode, unspecified: Secondary | ICD-10-CM | POA: Diagnosis not present

## 2018-06-15 DIAGNOSIS — F419 Anxiety disorder, unspecified: Secondary | ICD-10-CM | POA: Diagnosis not present

## 2018-06-15 DIAGNOSIS — E039 Hypothyroidism, unspecified: Secondary | ICD-10-CM | POA: Diagnosis not present

## 2018-06-18 ENCOUNTER — Other Ambulatory Visit: Payer: Self-pay

## 2018-06-18 ENCOUNTER — Ambulatory Visit (INDEPENDENT_AMBULATORY_CARE_PROVIDER_SITE_OTHER): Payer: Medicare HMO | Admitting: Physician Assistant

## 2018-06-18 ENCOUNTER — Encounter: Payer: Self-pay | Admitting: Physician Assistant

## 2018-06-18 VITALS — BP 75/42 | HR 65 | Temp 98.1°F

## 2018-06-18 DIAGNOSIS — Z8619 Personal history of other infectious and parasitic diseases: Secondary | ICD-10-CM | POA: Diagnosis not present

## 2018-06-18 DIAGNOSIS — F329 Major depressive disorder, single episode, unspecified: Secondary | ICD-10-CM | POA: Diagnosis not present

## 2018-06-18 DIAGNOSIS — G8253 Quadriplegia, C5-C7 complete: Secondary | ICD-10-CM | POA: Diagnosis not present

## 2018-06-18 DIAGNOSIS — E119 Type 2 diabetes mellitus without complications: Secondary | ICD-10-CM | POA: Diagnosis not present

## 2018-06-18 DIAGNOSIS — S14115D Complete lesion at C5 level of cervical spinal cord, subsequent encounter: Secondary | ICD-10-CM | POA: Diagnosis not present

## 2018-06-18 DIAGNOSIS — R8271 Bacteriuria: Secondary | ICD-10-CM | POA: Diagnosis not present

## 2018-06-18 DIAGNOSIS — Z79899 Other long term (current) drug therapy: Secondary | ICD-10-CM | POA: Diagnosis not present

## 2018-06-18 DIAGNOSIS — D649 Anemia, unspecified: Secondary | ICD-10-CM | POA: Diagnosis not present

## 2018-06-18 DIAGNOSIS — S14124S Central cord syndrome at C4 level of cervical spinal cord, sequela: Secondary | ICD-10-CM | POA: Diagnosis not present

## 2018-06-18 DIAGNOSIS — I959 Hypotension, unspecified: Secondary | ICD-10-CM | POA: Diagnosis not present

## 2018-06-18 DIAGNOSIS — Z880 Allergy status to penicillin: Secondary | ICD-10-CM | POA: Diagnosis not present

## 2018-06-18 DIAGNOSIS — Z9689 Presence of other specified functional implants: Secondary | ICD-10-CM | POA: Diagnosis not present

## 2018-06-18 DIAGNOSIS — Z881 Allergy status to other antibiotic agents status: Secondary | ICD-10-CM | POA: Diagnosis not present

## 2018-06-18 DIAGNOSIS — F419 Anxiety disorder, unspecified: Secondary | ICD-10-CM | POA: Diagnosis not present

## 2018-06-18 DIAGNOSIS — E039 Hypothyroidism, unspecified: Secondary | ICD-10-CM | POA: Diagnosis not present

## 2018-06-18 DIAGNOSIS — E11319 Type 2 diabetes mellitus with unspecified diabetic retinopathy without macular edema: Secondary | ICD-10-CM | POA: Diagnosis not present

## 2018-06-18 DIAGNOSIS — R531 Weakness: Secondary | ICD-10-CM | POA: Diagnosis not present

## 2018-06-18 DIAGNOSIS — F418 Other specified anxiety disorders: Secondary | ICD-10-CM | POA: Diagnosis not present

## 2018-06-18 NOTE — Progress Notes (Signed)
Virtual Visit via Video Note  I connected with Veronica [name] on 06/18/18 at 11:10 AM EDT by a video enabled telemedicine application and verified that I am speaking with the correct person using two identifiers.   I discussed the limitations of evaluation and management by telemedicine and the availability of in person appointments. The patient expressed understanding and agreed to proceed.  History of Present Illness: HPI:                                                                Veronica [name] is a 78 y.o. female with PMH spinal cord injury, neurogenic bladder, type 2 DM,   CC: low BP, diarrhea  Also on the phone with the patient today is a therapist from [facility] and patient's husband  She reports she had new onset diarrhea yesterday, approximately 5 episodes and thinks she is dehydrated. Recently hospitalized for urosepsis on 4/17-4/19.  Home health therapist states she has checked BP 4 times this morning and highest reading was 82/48  Past Medical History:  Diagnosis Date  . Anemia   . Diabetic retinopathy (HCC) 03/23/2015  . GERD (gastroesophageal reflux disease) 01/05/2015  . Hypothyroid   . Osteopenia 03/03/2014  . Type 2 diabetes mellitus with microalbuminuria or microproteinuria 03/03/2014   Actos added January 2016.     Past Surgical History:  Procedure Laterality Date  . APPENDECTOMY  02/28/11  . CESAREAN SECTION    . LAPAROSCOPIC APPENDECTOMY  03/03/2011   Procedure: APPENDECTOMY LAPAROSCOPIC;  Surgeon: Valarie MerinoMatthew B Martin, MD;  Location: [facility];  Service: General;  Laterality: N/A;  . TONSILLECTOMY     Social History   Tobacco Use  . Smoking status: Never Smoker  . Smokeless tobacco: Never Used  Substance Use Topics  . Alcohol use: No   family history includes Cancer in her father and mother; Diabetes in her mother; Hypertension in her mother; Stroke in her mother.    ROS: negative except as noted in the HPI  Medications: Current  Outpatient Medications  Medication Sig Dispense Refill  . Baclofen 5 MG TABS Take 5 mg by mouth 3 (three) times daily as needed. 90 tablet 1  . bisacodyl (DULCOLAX) 10 MG suppository Place rectally.    . carvedilol (COREG) 6.25 MG tablet     . doxepin (SINEQUAN) 10 MG capsule Take by mouth.    . DULoxetine (CYMBALTA) 60 MG capsule Take by mouth.    Everlene Balls. ELIQUIS 5 MG TABS tablet     . gabapentin (NEURONTIN) 300 MG capsule Take 2-3 capsules (600-900 mg total) by mouth 3 (three) times daily. 270 capsule 2  . insulin degludec (TRESIBA) 100 UNIT/ML SOPN FlexTouch Pen Inject into the skin.    [name]. levothyroxine (SYNTHROID, LEVOTHROID) 88 MCG tablet Take 1 tablet (88 mcg total) by mouth daily. FOLLOWING WITH ENDOCRINOLOGY DR LEVY 1 tablet 0  . oxyCODONE (OXY IR/ROXICODONE) 5 MG immediate release tablet Take 1 tablet (5 mg total) by mouth every 8 (eight) hours as needed for severe pain or breakthrough pain. 90 tablet 0  . acetaminophen (TYLENOL) 500 MG tablet Take by mouth.    . clonazePAM (KLONOPIN) 0.5 MG tablet Take 1 tablet (0.5 mg total) by mouth 3 (three) times daily as needed (#90 for thirty days).  for anxiety 90 tablet 2  . insulin aspart (FIASP FLEXTOUCH) 100 UNIT/ML FlexPen Inject into the skin.    [name] insulin NPH Human (HUMULIN N,NOVOLIN N) 100 UNIT/ML injection Inject into the skin.    [name] insulin regular (NOVOLIN R,HUMULIN R) 100 units/mL injection Inject into the skin.    . metFORMIN (GLUCOPHAGE-XR) 500 MG 24 hr tablet Take by mouth. FOLLOWING W/ ENDOCRINOLOGY DR. Shawnee Knapp    . ranitidine (ZANTAC) 300 MG capsule Take 1 capsule (300 mg total) by mouth every evening. (Patient not taking: Reported on 06/18/2018) 90 capsule 3   No current facility-administered medications for this visit.    Allergies  Allergen Reactions  . Dulaglutide Palpitations    SOB. N/V  . Cephalexin Other (See Comments)    hives   . Codeine Hives and Nausea And Vomiting  . Glimepiride Hives  . Penicillins Rash  .  Sitagliptin Other (See Comments)    Insomnia   . Sulfamethoxazole-Trimethoprim Hives  . Ezetimibe   . Guaifenesin   . Metformin And Related     diarrhea  . Other Other (See Comments)    All oral diabetic drugs: nausea, vomiting, dizziness Unknown antibiotic: "starts with a P" - rash  . Penicillin G Itching  . Pseudoephedrine Hcl   . Sulfa Antibiotics Hives and Nausea And Vomiting  . Sulfamethoxazole Itching       Objective:  BP (!) 75/42   Pulse 65   Temp 98.1 F (36.7 C) (Oral)  Vitals:   06/18/18 1103  BP: (!) 75/42  Pulse: 65  Temp: 98.1 F (36.7 C)  Pulm: Normal work of breathing, normal phonation, speaking in full sentences Neuro: alert and oriented x 3, speech is slowed and slightly slurred  BP Readings from Last 3 Encounters:  06/18/18 (!) 75/42  06/13/18 110/82  06/04/18 106/68     No results found for this or any previous visit (from the past 72 hour(s)). No results found.    Assessment and Plan: 78 y.o. female with   .[name] was seen today for hypotension.  Diagnoses and all orders for this visit:  Hypotension, unspecified hypotension type  History of sepsis   Given patient's recent hospitalization for urosepsis and new onset diarrhea yesterday with confirmed hypotension by home health therapist today, I am concerned about acute kidney injury and possible sepsis. Advised patient to go to her nearest emergency department for further evaluation. Patient's husband stated he would take her to [facility] in [place].  Called and spoke to the triage nurse at 11:26 AM and gave report.   Follow Up Instructions:    I discussed the assessment and treatment plan with the patient. The patient was provided an opportunity to ask questions and all were answered. The patient agreed with the plan and demonstrated an understanding of the instructions.   The patient was advised to call back or seek an in-person evaluation if the symptoms worsen or if  the condition fails to improve as anticipated.  I provided 15 minutes of non-face-to-face time during this encounter.   Carlis Stable, [place]

## 2018-06-18 NOTE — Patient Outreach (Signed)
[facility]) Care Management  06/18/2018  [name] 01-Jul-1940 [id number]   Multiple attempts to establish contact with patient without success. No response from letter mailed to patient.   Plan: RN CM will close case at this time.   Bary Leriche, RN, MSN [facility] Care Management Care Management Coordinator Direct Line [phone] Cell [phone] Toll Free: [phone]  Fax: [phone]

## 2018-06-19 DIAGNOSIS — E039 Hypothyroidism, unspecified: Secondary | ICD-10-CM | POA: Diagnosis not present

## 2018-06-19 DIAGNOSIS — F329 Major depressive disorder, single episode, unspecified: Secondary | ICD-10-CM | POA: Diagnosis not present

## 2018-06-19 DIAGNOSIS — S14115D Complete lesion at C5 level of cervical spinal cord, subsequent encounter: Secondary | ICD-10-CM | POA: Diagnosis not present

## 2018-06-19 DIAGNOSIS — E11319 Type 2 diabetes mellitus with unspecified diabetic retinopathy without macular edema: Secondary | ICD-10-CM | POA: Diagnosis not present

## 2018-06-19 DIAGNOSIS — F419 Anxiety disorder, unspecified: Secondary | ICD-10-CM | POA: Diagnosis not present

## 2018-06-19 DIAGNOSIS — G8253 Quadriplegia, C5-C7 complete: Secondary | ICD-10-CM | POA: Diagnosis not present

## 2018-06-19 DIAGNOSIS — Z9689 Presence of other specified functional implants: Secondary | ICD-10-CM | POA: Diagnosis not present

## 2018-06-19 DIAGNOSIS — D649 Anemia, unspecified: Secondary | ICD-10-CM | POA: Diagnosis not present

## 2018-06-19 DIAGNOSIS — E119 Type 2 diabetes mellitus without complications: Secondary | ICD-10-CM | POA: Diagnosis not present

## 2018-06-20 DIAGNOSIS — E119 Type 2 diabetes mellitus without complications: Secondary | ICD-10-CM | POA: Diagnosis not present

## 2018-06-20 DIAGNOSIS — Z9689 Presence of other specified functional implants: Secondary | ICD-10-CM | POA: Diagnosis not present

## 2018-06-20 DIAGNOSIS — E039 Hypothyroidism, unspecified: Secondary | ICD-10-CM | POA: Diagnosis not present

## 2018-06-20 DIAGNOSIS — S14115D Complete lesion at C5 level of cervical spinal cord, subsequent encounter: Secondary | ICD-10-CM | POA: Diagnosis not present

## 2018-06-20 DIAGNOSIS — F419 Anxiety disorder, unspecified: Secondary | ICD-10-CM | POA: Diagnosis not present

## 2018-06-20 DIAGNOSIS — F329 Major depressive disorder, single episode, unspecified: Secondary | ICD-10-CM | POA: Diagnosis not present

## 2018-06-20 DIAGNOSIS — D649 Anemia, unspecified: Secondary | ICD-10-CM | POA: Diagnosis not present

## 2018-06-20 DIAGNOSIS — G8253 Quadriplegia, C5-C7 complete: Secondary | ICD-10-CM | POA: Diagnosis not present

## 2018-06-20 DIAGNOSIS — E11319 Type 2 diabetes mellitus with unspecified diabetic retinopathy without macular edema: Secondary | ICD-10-CM | POA: Diagnosis not present

## 2018-06-21 DIAGNOSIS — F329 Major depressive disorder, single episode, unspecified: Secondary | ICD-10-CM | POA: Diagnosis not present

## 2018-06-21 DIAGNOSIS — G8253 Quadriplegia, C5-C7 complete: Secondary | ICD-10-CM | POA: Diagnosis not present

## 2018-06-21 DIAGNOSIS — Z9689 Presence of other specified functional implants: Secondary | ICD-10-CM | POA: Diagnosis not present

## 2018-06-21 DIAGNOSIS — E119 Type 2 diabetes mellitus without complications: Secondary | ICD-10-CM | POA: Diagnosis not present

## 2018-06-21 DIAGNOSIS — D649 Anemia, unspecified: Secondary | ICD-10-CM | POA: Diagnosis not present

## 2018-06-21 DIAGNOSIS — E039 Hypothyroidism, unspecified: Secondary | ICD-10-CM | POA: Diagnosis not present

## 2018-06-21 DIAGNOSIS — S14115D Complete lesion at C5 level of cervical spinal cord, subsequent encounter: Secondary | ICD-10-CM | POA: Diagnosis not present

## 2018-06-21 DIAGNOSIS — E11319 Type 2 diabetes mellitus with unspecified diabetic retinopathy without macular edema: Secondary | ICD-10-CM | POA: Diagnosis not present

## 2018-06-21 DIAGNOSIS — F419 Anxiety disorder, unspecified: Secondary | ICD-10-CM | POA: Diagnosis not present

## 2018-06-22 DIAGNOSIS — E1165 Type 2 diabetes mellitus with hyperglycemia: Secondary | ICD-10-CM | POA: Diagnosis not present

## 2018-06-25 DIAGNOSIS — Z9689 Presence of other specified functional implants: Secondary | ICD-10-CM | POA: Diagnosis not present

## 2018-06-25 DIAGNOSIS — S14115D Complete lesion at C5 level of cervical spinal cord, subsequent encounter: Secondary | ICD-10-CM | POA: Diagnosis not present

## 2018-06-25 DIAGNOSIS — D649 Anemia, unspecified: Secondary | ICD-10-CM | POA: Diagnosis not present

## 2018-06-25 DIAGNOSIS — F419 Anxiety disorder, unspecified: Secondary | ICD-10-CM | POA: Diagnosis not present

## 2018-06-25 DIAGNOSIS — E119 Type 2 diabetes mellitus without complications: Secondary | ICD-10-CM | POA: Diagnosis not present

## 2018-06-25 DIAGNOSIS — E039 Hypothyroidism, unspecified: Secondary | ICD-10-CM | POA: Diagnosis not present

## 2018-06-25 DIAGNOSIS — E11319 Type 2 diabetes mellitus with unspecified diabetic retinopathy without macular edema: Secondary | ICD-10-CM | POA: Diagnosis not present

## 2018-06-25 DIAGNOSIS — F329 Major depressive disorder, single episode, unspecified: Secondary | ICD-10-CM | POA: Diagnosis not present

## 2018-06-25 DIAGNOSIS — G8253 Quadriplegia, C5-C7 complete: Secondary | ICD-10-CM | POA: Diagnosis not present

## 2018-06-26 DIAGNOSIS — E039 Hypothyroidism, unspecified: Secondary | ICD-10-CM | POA: Diagnosis not present

## 2018-06-26 DIAGNOSIS — E11319 Type 2 diabetes mellitus with unspecified diabetic retinopathy without macular edema: Secondary | ICD-10-CM | POA: Diagnosis not present

## 2018-06-26 DIAGNOSIS — S14115D Complete lesion at C5 level of cervical spinal cord, subsequent encounter: Secondary | ICD-10-CM | POA: Diagnosis not present

## 2018-06-26 DIAGNOSIS — E119 Type 2 diabetes mellitus without complications: Secondary | ICD-10-CM | POA: Diagnosis not present

## 2018-06-26 DIAGNOSIS — D649 Anemia, unspecified: Secondary | ICD-10-CM | POA: Diagnosis not present

## 2018-06-26 DIAGNOSIS — Z9689 Presence of other specified functional implants: Secondary | ICD-10-CM | POA: Diagnosis not present

## 2018-06-26 DIAGNOSIS — F329 Major depressive disorder, single episode, unspecified: Secondary | ICD-10-CM | POA: Diagnosis not present

## 2018-06-26 DIAGNOSIS — G8253 Quadriplegia, C5-C7 complete: Secondary | ICD-10-CM | POA: Diagnosis not present

## 2018-06-26 DIAGNOSIS — F419 Anxiety disorder, unspecified: Secondary | ICD-10-CM | POA: Diagnosis not present

## 2018-06-27 DIAGNOSIS — G8253 Quadriplegia, C5-C7 complete: Secondary | ICD-10-CM | POA: Diagnosis not present

## 2018-06-27 DIAGNOSIS — E11319 Type 2 diabetes mellitus with unspecified diabetic retinopathy without macular edema: Secondary | ICD-10-CM | POA: Diagnosis not present

## 2018-06-27 DIAGNOSIS — E039 Hypothyroidism, unspecified: Secondary | ICD-10-CM | POA: Diagnosis not present

## 2018-06-27 DIAGNOSIS — Z9689 Presence of other specified functional implants: Secondary | ICD-10-CM | POA: Diagnosis not present

## 2018-06-27 DIAGNOSIS — F329 Major depressive disorder, single episode, unspecified: Secondary | ICD-10-CM | POA: Diagnosis not present

## 2018-06-27 DIAGNOSIS — S14115D Complete lesion at C5 level of cervical spinal cord, subsequent encounter: Secondary | ICD-10-CM | POA: Diagnosis not present

## 2018-06-27 DIAGNOSIS — E119 Type 2 diabetes mellitus without complications: Secondary | ICD-10-CM | POA: Diagnosis not present

## 2018-06-27 DIAGNOSIS — D649 Anemia, unspecified: Secondary | ICD-10-CM | POA: Diagnosis not present

## 2018-06-27 DIAGNOSIS — F419 Anxiety disorder, unspecified: Secondary | ICD-10-CM | POA: Diagnosis not present

## 2018-06-28 DIAGNOSIS — E039 Hypothyroidism, unspecified: Secondary | ICD-10-CM | POA: Diagnosis not present

## 2018-06-28 DIAGNOSIS — E119 Type 2 diabetes mellitus without complications: Secondary | ICD-10-CM | POA: Diagnosis not present

## 2018-06-28 DIAGNOSIS — F329 Major depressive disorder, single episode, unspecified: Secondary | ICD-10-CM | POA: Diagnosis not present

## 2018-06-28 DIAGNOSIS — S14115D Complete lesion at C5 level of cervical spinal cord, subsequent encounter: Secondary | ICD-10-CM | POA: Diagnosis not present

## 2018-06-28 DIAGNOSIS — D649 Anemia, unspecified: Secondary | ICD-10-CM | POA: Diagnosis not present

## 2018-06-28 DIAGNOSIS — G8253 Quadriplegia, C5-C7 complete: Secondary | ICD-10-CM | POA: Diagnosis not present

## 2018-06-28 DIAGNOSIS — Z9689 Presence of other specified functional implants: Secondary | ICD-10-CM | POA: Diagnosis not present

## 2018-06-28 DIAGNOSIS — F419 Anxiety disorder, unspecified: Secondary | ICD-10-CM | POA: Diagnosis not present

## 2018-06-28 DIAGNOSIS — E11319 Type 2 diabetes mellitus with unspecified diabetic retinopathy without macular edema: Secondary | ICD-10-CM | POA: Diagnosis not present

## 2018-06-29 ENCOUNTER — Telehealth: Payer: Self-pay

## 2018-06-29 ENCOUNTER — Other Ambulatory Visit: Payer: Self-pay

## 2018-06-29 DIAGNOSIS — F419 Anxiety disorder, unspecified: Secondary | ICD-10-CM

## 2018-06-29 DIAGNOSIS — F329 Major depressive disorder, single episode, unspecified: Secondary | ICD-10-CM

## 2018-06-29 DIAGNOSIS — E119 Type 2 diabetes mellitus without complications: Secondary | ICD-10-CM | POA: Diagnosis not present

## 2018-06-29 DIAGNOSIS — D649 Anemia, unspecified: Secondary | ICD-10-CM | POA: Diagnosis not present

## 2018-06-29 DIAGNOSIS — F32A Depression, unspecified: Secondary | ICD-10-CM

## 2018-06-29 DIAGNOSIS — G8253 Quadriplegia, C5-C7 complete: Secondary | ICD-10-CM | POA: Diagnosis not present

## 2018-06-29 DIAGNOSIS — Z9689 Presence of other specified functional implants: Secondary | ICD-10-CM | POA: Diagnosis not present

## 2018-06-29 DIAGNOSIS — S14115D Complete lesion at C5 level of cervical spinal cord, subsequent encounter: Secondary | ICD-10-CM | POA: Diagnosis not present

## 2018-06-29 DIAGNOSIS — E039 Hypothyroidism, unspecified: Secondary | ICD-10-CM | POA: Diagnosis not present

## 2018-06-29 DIAGNOSIS — E11319 Type 2 diabetes mellitus with unspecified diabetic retinopathy without macular edema: Secondary | ICD-10-CM | POA: Diagnosis not present

## 2018-06-29 MED ORDER — DOXEPIN HCL 10 MG PO CAPS: 10.0000 mg | ORAL_CAPSULE | Freq: Every day | ORAL | 1 refills | Status: DC

## 2018-06-29 MED ORDER — CARVEDILOL 6.25 MG PO TABS: 6.2500 mg | ORAL_TABLET | Freq: Two times a day (BID) | ORAL | 1 refills | Status: DC

## 2018-06-29 MED ORDER — CLONAZEPAM 0.5 MG PO TABS: 0.5000 mg | ORAL_TABLET | Freq: Three times a day (TID) | ORAL | 2 refills | Status: DC | PRN

## 2018-06-29 MED ORDER — BACLOFEN 5 MG PO TABS: 5.0000 mg | ORAL_TABLET | Freq: Three times a day (TID) | ORAL | 1 refills | Status: DC | PRN

## 2018-06-29 MED ORDER — OXYCODONE HCL 5 MG PO TABS: 5.0000 mg | ORAL_TABLET | Freq: Four times a day (QID) | ORAL | 0 refills | Status: DC | PRN

## 2018-06-29 MED ORDER — DULOXETINE HCL 60 MG PO CPEP: 60.0000 mg | ORAL_CAPSULE | Freq: Every day | ORAL | 1 refills | Status: DC

## 2018-06-29 MED ORDER — ELIQUIS 5 MG PO TABS: 5.0000 mg | ORAL_TABLET | Freq: Two times a day (BID) | ORAL | 1 refills | Status: DC

## 2018-06-29 MED ORDER — GABAPENTIN 300 MG PO CAPS: 600.0000 mg | ORAL_CAPSULE | Freq: Three times a day (TID) | ORAL | 1 refills | Status: DC

## 2018-06-29 NOTE — Telephone Encounter (Signed)
OK sent to local pharmacy on file [facility] [place]!

## 2018-06-29 NOTE — Telephone Encounter (Signed)
Pt's husband advised.  

## 2018-06-29 NOTE — Telephone Encounter (Signed)
Pt advised. She needs refill at this time. Routing to PCP for it to be sent electronically.

## 2018-06-29 NOTE — Telephone Encounter (Signed)
Veronica [name] called she states she has a lot of pain. She wanted to know if [name] could increase her oxycodone. Or if it would be best to go to Pain Management. Please advise.

## 2018-06-29 NOTE — Telephone Encounter (Signed)
Let's try increased form every 8 hours to every 6 hours as needed  Call/message when in need of refills - we can set up a virtual visit to see how this is working

## 2018-06-30 DIAGNOSIS — G8250 Quadriplegia, unspecified: Secondary | ICD-10-CM | POA: Diagnosis not present

## 2018-07-02 DIAGNOSIS — Z9689 Presence of other specified functional implants: Secondary | ICD-10-CM | POA: Diagnosis not present

## 2018-07-02 DIAGNOSIS — E11319 Type 2 diabetes mellitus with unspecified diabetic retinopathy without macular edema: Secondary | ICD-10-CM | POA: Diagnosis not present

## 2018-07-02 DIAGNOSIS — S14115D Complete lesion at C5 level of cervical spinal cord, subsequent encounter: Secondary | ICD-10-CM | POA: Diagnosis not present

## 2018-07-02 DIAGNOSIS — F419 Anxiety disorder, unspecified: Secondary | ICD-10-CM | POA: Diagnosis not present

## 2018-07-02 DIAGNOSIS — F329 Major depressive disorder, single episode, unspecified: Secondary | ICD-10-CM | POA: Diagnosis not present

## 2018-07-02 DIAGNOSIS — E039 Hypothyroidism, unspecified: Secondary | ICD-10-CM | POA: Diagnosis not present

## 2018-07-02 DIAGNOSIS — G8253 Quadriplegia, C5-C7 complete: Secondary | ICD-10-CM | POA: Diagnosis not present

## 2018-07-02 DIAGNOSIS — D649 Anemia, unspecified: Secondary | ICD-10-CM | POA: Diagnosis not present

## 2018-07-02 DIAGNOSIS — E119 Type 2 diabetes mellitus without complications: Secondary | ICD-10-CM | POA: Diagnosis not present

## 2018-07-03 ENCOUNTER — Telehealth: Payer: Self-pay | Admitting: Osteopathic Medicine

## 2018-07-03 ENCOUNTER — Ambulatory Visit (INDEPENDENT_AMBULATORY_CARE_PROVIDER_SITE_OTHER): Payer: Medicare HMO | Admitting: Osteopathic Medicine

## 2018-07-03 VITALS — BP 110/70 | HR 65

## 2018-07-03 DIAGNOSIS — E039 Hypothyroidism, unspecified: Secondary | ICD-10-CM | POA: Diagnosis not present

## 2018-07-03 DIAGNOSIS — E119 Type 2 diabetes mellitus without complications: Secondary | ICD-10-CM | POA: Diagnosis not present

## 2018-07-03 DIAGNOSIS — E11319 Type 2 diabetes mellitus with unspecified diabetic retinopathy without macular edema: Secondary | ICD-10-CM | POA: Diagnosis not present

## 2018-07-03 DIAGNOSIS — G8250 Quadriplegia, unspecified: Secondary | ICD-10-CM | POA: Diagnosis not present

## 2018-07-03 DIAGNOSIS — Z9689 Presence of other specified functional implants: Secondary | ICD-10-CM | POA: Diagnosis not present

## 2018-07-03 DIAGNOSIS — IMO0002 Reserved for concepts with insufficient information to code with codable children: Secondary | ICD-10-CM

## 2018-07-03 DIAGNOSIS — S14115D Complete lesion at C5 level of cervical spinal cord, subsequent encounter: Secondary | ICD-10-CM | POA: Diagnosis not present

## 2018-07-03 DIAGNOSIS — I82402 Acute embolism and thrombosis of unspecified deep veins of left lower extremity: Secondary | ICD-10-CM

## 2018-07-03 DIAGNOSIS — F329 Major depressive disorder, single episode, unspecified: Secondary | ICD-10-CM | POA: Diagnosis not present

## 2018-07-03 DIAGNOSIS — Z86718 Personal history of other venous thrombosis and embolism: Secondary | ICD-10-CM

## 2018-07-03 DIAGNOSIS — G8253 Quadriplegia, C5-C7 complete: Secondary | ICD-10-CM | POA: Diagnosis not present

## 2018-07-03 DIAGNOSIS — R600 Localized edema: Secondary | ICD-10-CM

## 2018-07-03 DIAGNOSIS — F419 Anxiety disorder, unspecified: Secondary | ICD-10-CM | POA: Diagnosis not present

## 2018-07-03 DIAGNOSIS — D649 Anemia, unspecified: Secondary | ICD-10-CM | POA: Diagnosis not present

## 2018-07-03 MED ORDER — GABAPENTIN 300 MG PO CAPS: 600.0000 mg | ORAL_CAPSULE | Freq: Three times a day (TID) | ORAL | 1 refills | Status: DC

## 2018-07-03 MED ORDER — BACLOFEN 5 MG PO TABS: 5.0000 mg | ORAL_TABLET | Freq: Three times a day (TID) | ORAL | 1 refills | Status: DC | PRN

## 2018-07-03 NOTE — Progress Notes (Signed)
Virtual Visit via Phone Note  I connected with      Veronica Gauzehyllis Rigaud on 07/03/18 at 10:50 by a telemedicine application and verified that I am speaking with the correct person using two identifiers.  Patient is at home I am in office    I discussed the limitations of evaluation and management by telemedicine and the availability of in person appointments. The patient expressed understanding and agreed to proceed.  History of Present Illness: Veronica [name] is a 78 y.o. female who would like to discuss  Concern with leg swelling Follow-up pain management   Home health nurse Alcario Droughtrica and patient's husband are also on the call.   Concern about blood clot in the leg, swollen and warm over the weekend.  Looking better today.  History of DVT in that leg, patient was subsequently placed on Eliquis and reports compliance with this medication.  Is asking if she can get an ultrasound in the home to evaluate this, due to difficulty leaving the house secondary to quadriplegia.  She reports she has had a few ultrasounds.  Reports no fever or chills, no rashes.  Reports intermittent pain, is taking the oxycodone typically when she experiences significant muscle cramping, not taking it on a schedule but she is able to take it every 6 hours as needed.  She is also taking baclofen and gabapentin.  Baclofen 3 times daily, gabapentin occasionally up to 900 mg nightly as needed        Observations/Objective: BP 110/70   Pulse 65  BP Readings from Last 3 Encounters:  07/03/18 110/70  06/18/18 (!) 75/42  06/13/18 110/82   Exam: Normal Speech.    Lab and Radiology Results No results found for this or any previous visit (from the past 72 hour(s)). No results found.     Assessment and Plan: 78 y.o. female with The primary encounter diagnosis was Quadriplegia following spinal cord injury (HCC). A diagnosis of Deep vein thrombosis (DVT) of left lower extremity, unspecified chronicity,  unspecified vein (HCC) was also pertinent to this visit.  Oxycodone refilled 07/01/18 for #120 Stable on medicines  Looked into it, we are unable to get an ultrasound in the home through home health, [facility].  I advised patient that some residual DVT may be present, as long as it is not worse.  If shortness of breath, needs to go to hospital.  Also could not rule out cellulitis but from the patient's description it does not really sound like this.   PDMP reviewed during this encounter. No orders of the defined types were placed in this encounter.  Meds ordered this encounter  Medications  . gabapentin (NEURONTIN) 300 MG capsule    Sig: Take 2-3 capsules (600-900 mg total) by mouth 3 (three) times daily.    Dispense:  540 capsule    Refill:  1  . Baclofen 5 MG TABS    Sig: Take 5 mg by mouth 3 (three) times daily as needed.    Dispense:  270 tablet    Refill:  1     Instructions sent via MyChart. If MyChart not available, pt was given option for info via personal e-mail w/ no guarantee of protected health info over unsecured e-mail communication, and MyChart sign-up instructions were included.   Follow Up Instructions: Return in about 4 weeks (around 07/31/2018) for follow up pain management - virtual visit ok .    I discussed the assessment and treatment plan with the  patient. The patient was provided an opportunity to ask questions and all were answered. The patient agreed with the plan and demonstrated an understanding of the instructions.   The patient was advised to call back or seek an in-person evaluation if any new concerns, if symptoms worsen or if the condition fails to improve as anticipated.  30 minutes of non-face-to-face time was provided during this encounter.                      Historical information moved to improve visibility of documentation.  Past Medical History:  Diagnosis Date  . Anemia   . Diabetic retinopathy  (HCC) 03/23/2015  . GERD (gastroesophageal reflux disease) 01/05/2015  . Hypothyroid   . Osteopenia 03/03/2014  . Type 2 diabetes mellitus with microalbuminuria or microproteinuria 03/03/2014   Actos added January 2016.     Past Surgical History:  Procedure Laterality Date  . APPENDECTOMY  02/28/11  . CESAREAN SECTION    . LAPAROSCOPIC APPENDECTOMY  03/03/2011   Procedure: APPENDECTOMY LAPAROSCOPIC;  Surgeon: Valarie Merino, MD;  Location: [facility];  Service: General;  Laterality: N/A;  . TONSILLECTOMY     Social History   Tobacco Use  . Smoking status: Never Smoker  . Smokeless tobacco: Never Used  Substance Use Topics  . Alcohol use: No   family history includes Cancer in her father and mother; Diabetes in her mother; Hypertension in her mother; Stroke in her mother.  Medications: Current Outpatient Medications  Medication Sig Dispense Refill  . Baclofen 5 MG TABS Take 5 mg by mouth 3 (three) times daily as needed. 270 tablet 1  . bisacodyl (DULCOLAX) 10 MG suppository Place rectally.    . carvedilol (COREG) 6.25 MG tablet Take 1 tablet (6.25 mg total) by mouth 2 (two) times daily with a meal. 180 tablet 1  . clonazePAM (KLONOPIN) 0.5 MG tablet Take 1 tablet (0.5 mg total) by mouth 3 (three) times daily as needed for up to 30 days (#90 for thirty days). for anxiety 90 tablet 2  . doxepin (SINEQUAN) 10 MG capsule Take 1 capsule (10 mg total) by mouth at bedtime. 90 capsule 1  . DULoxetine (CYMBALTA) 60 MG capsule Take 1 capsule (60 mg total) by mouth daily. 90 capsule 1  . ELIQUIS 5 MG TABS tablet Take 1 tablet (5 mg total) by mouth 2 (two) times daily. 180 tablet 1  . gabapentin (NEURONTIN) 300 MG capsule Take 2-3 capsules (600-900 mg total) by mouth 3 (three) times daily. 540 capsule 1  . insulin degludec (TRESIBA) 100 UNIT/ML SOPN FlexTouch Pen Inject into the skin.    [name] levothyroxine (SYNTHROID, LEVOTHROID) 88 MCG tablet Take 1 tablet (88 mcg total) by mouth daily. FOLLOWING WITH  ENDOCRINOLOGY DR LEVY 1 tablet 0  . oxyCODONE (OXY IR/ROXICODONE) 5 MG immediate release tablet Take 1 tablet (5 mg total) by mouth every 6 (six) hours as needed for severe pain or breakthrough pain (#120 for 30 days). 120 tablet 0  . acetaminophen (TYLENOL) 500 MG tablet Take by mouth.    . insulin aspart (FIASP FLEXTOUCH) 100 UNIT/ML FlexPen Inject into the skin.    [name] insulin NPH Human (HUMULIN N,NOVOLIN N) 100 UNIT/ML injection Inject into the skin.    [name] insulin regular (NOVOLIN R,HUMULIN R) 100 units/mL injection Inject into the skin.    . metFORMIN (GLUCOPHAGE-XR) 500 MG 24 hr tablet Take by mouth. FOLLOWING W/ ENDOCRINOLOGY DR. Shawnee Knapp    . ranitidine (ZANTAC) 300  MG capsule Take 1 capsule (300 mg total) by mouth every evening. (Patient not taking: Reported on 06/18/2018) 90 capsule 3   No current facility-administered medications for this visit.    Allergies  Allergen Reactions  . Dulaglutide Palpitations    SOB. N/V  . Cephalexin Other (See Comments)    hives   . Codeine Hives and Nausea And Vomiting  . Glimepiride Hives  . Penicillins Rash  . Sitagliptin Other (See Comments)    Insomnia   . Sulfamethoxazole-Trimethoprim Hives  . Ezetimibe   . Guaifenesin   . Metformin And Related     diarrhea  . Other Other (See Comments)    All oral diabetic drugs: nausea, vomiting, dizziness Unknown antibiotic: "starts with a P" - rash  . Penicillin G Itching  . Pseudoephedrine Hcl   . Sulfa Antibiotics Hives and Nausea And Vomiting  . Sulfamethoxazole Itching    PDMP reviewed during this encounter. No orders of the defined types were placed in this encounter.  Meds ordered this encounter  Medications  . gabapentin (NEURONTIN) 300 MG capsule    Sig: Take 2-3 capsules (600-900 mg total) by mouth 3 (three) times daily.    Dispense:  540 capsule    Refill:  1  . Baclofen 5 MG TABS    Sig: Take 5 mg by mouth 3 (three) times daily as needed.    Dispense:  270 tablet    Refill:  1

## 2018-07-03 NOTE — Telephone Encounter (Signed)
Please call patient:  Home health cannot perform the ultrasound I ordered one to get done at [facility] if that's ok

## 2018-07-03 NOTE — Telephone Encounter (Signed)
Pt has been updated of U/S referral to be done in [employer]. No other inquiries during call.

## 2018-07-04 ENCOUNTER — Telehealth: Payer: Self-pay

## 2018-07-04 DIAGNOSIS — E119 Type 2 diabetes mellitus without complications: Secondary | ICD-10-CM | POA: Diagnosis not present

## 2018-07-04 DIAGNOSIS — S14115D Complete lesion at C5 level of cervical spinal cord, subsequent encounter: Secondary | ICD-10-CM | POA: Diagnosis not present

## 2018-07-04 DIAGNOSIS — F419 Anxiety disorder, unspecified: Secondary | ICD-10-CM | POA: Diagnosis not present

## 2018-07-04 DIAGNOSIS — G8253 Quadriplegia, C5-C7 complete: Secondary | ICD-10-CM | POA: Diagnosis not present

## 2018-07-04 DIAGNOSIS — F329 Major depressive disorder, single episode, unspecified: Secondary | ICD-10-CM | POA: Diagnosis not present

## 2018-07-04 DIAGNOSIS — D649 Anemia, unspecified: Secondary | ICD-10-CM | POA: Diagnosis not present

## 2018-07-04 DIAGNOSIS — E039 Hypothyroidism, unspecified: Secondary | ICD-10-CM | POA: Diagnosis not present

## 2018-07-04 DIAGNOSIS — Z9689 Presence of other specified functional implants: Secondary | ICD-10-CM | POA: Diagnosis not present

## 2018-07-04 DIAGNOSIS — E11319 Type 2 diabetes mellitus with unspecified diabetic retinopathy without macular edema: Secondary | ICD-10-CM | POA: Diagnosis not present

## 2018-07-04 NOTE — Telephone Encounter (Signed)
[facility] pharmacy requesting rx for accu-chek aviva plus test strips, accu-check softclix lancets and bd single use alcohol swab. New rxs. Not listed in current med list.

## 2018-07-05 ENCOUNTER — Telehealth: Payer: Self-pay | Admitting: Osteopathic Medicine

## 2018-07-05 ENCOUNTER — Other Ambulatory Visit: Payer: Self-pay

## 2018-07-05 DIAGNOSIS — F419 Anxiety disorder, unspecified: Secondary | ICD-10-CM | POA: Diagnosis not present

## 2018-07-05 DIAGNOSIS — S14115D Complete lesion at C5 level of cervical spinal cord, subsequent encounter: Secondary | ICD-10-CM | POA: Diagnosis not present

## 2018-07-05 DIAGNOSIS — G8253 Quadriplegia, C5-C7 complete: Secondary | ICD-10-CM | POA: Diagnosis not present

## 2018-07-05 DIAGNOSIS — E039 Hypothyroidism, unspecified: Secondary | ICD-10-CM | POA: Diagnosis not present

## 2018-07-05 DIAGNOSIS — E11319 Type 2 diabetes mellitus with unspecified diabetic retinopathy without macular edema: Secondary | ICD-10-CM | POA: Diagnosis not present

## 2018-07-05 DIAGNOSIS — D649 Anemia, unspecified: Secondary | ICD-10-CM | POA: Diagnosis not present

## 2018-07-05 DIAGNOSIS — Z9689 Presence of other specified functional implants: Secondary | ICD-10-CM | POA: Diagnosis not present

## 2018-07-05 DIAGNOSIS — F329 Major depressive disorder, single episode, unspecified: Secondary | ICD-10-CM | POA: Diagnosis not present

## 2018-07-05 DIAGNOSIS — E119 Type 2 diabetes mellitus without complications: Secondary | ICD-10-CM | POA: Diagnosis not present

## 2018-07-05 MED ORDER — GABAPENTIN 300 MG PO CAPS: 600.0000 mg | ORAL_CAPSULE | Freq: Three times a day (TID) | ORAL | 1 refills | Status: DC

## 2018-07-05 NOTE — Telephone Encounter (Signed)
Medication: Doxepin HCI 10MG  Capsules  Key: [id number]  COVERMYMEDS

## 2018-07-05 NOTE — Telephone Encounter (Signed)
Approvedon May 13 Doxepin PA Case: [id number], Status: Approved, Coverage Starts on: 02/21/2018 12:00:00 AM, Coverage Ends on: 02/21/2019 12:00:00 AM. Questions? Contact [phone]. Pharmacy aware.

## 2018-07-06 ENCOUNTER — Ambulatory Visit (HOSPITAL_COMMUNITY)
Admission: RE | Admit: 2018-07-06 | Discharge: 2018-07-06 | Disposition: A | Discharge status: Home / Self Care | Payer: Medicare HMO | Source: Ambulatory Visit | Attending: Osteopathic Medicine | Admitting: Osteopathic Medicine

## 2018-07-06 ENCOUNTER — Other Ambulatory Visit: Payer: Self-pay

## 2018-07-06 DIAGNOSIS — R600 Localized edema: Secondary | ICD-10-CM | POA: Diagnosis not present

## 2018-07-06 DIAGNOSIS — Z86718 Personal history of other venous thrombosis and embolism: Secondary | ICD-10-CM | POA: Insufficient documentation

## 2018-07-06 MED ORDER — ACCU-CHEK SOFTCLIX LANCETS MISC: 99 refills | Status: AC

## 2018-07-06 MED ORDER — ALCOHOL SWABS PADS: MEDICATED_PAD | 99 refills | Status: AC

## 2018-07-06 MED ORDER — GLUCOSE BLOOD VI STRP: ORAL_STRIP | 99 refills | Status: AC

## 2018-07-06 NOTE — Telephone Encounter (Signed)
Sent!

## 2018-07-06 NOTE — Telephone Encounter (Signed)
Pt's [place] was positive for DVT. Continue Eliquis. She will work on getting [place] that was done at home put on a CD. Then she will bring CD to PCP.

## 2018-07-06 NOTE — Progress Notes (Signed)
Left lower extremity venous duplex has been completed. Preliminary results can be found in [facility] through chart review.  Results were given to Dr. Lyn Hollingshead.  07/06/18 4:31 PM Olen Cordial RVT

## 2018-07-09 ENCOUNTER — Telehealth: Payer: Self-pay

## 2018-07-09 DIAGNOSIS — E039 Hypothyroidism, unspecified: Secondary | ICD-10-CM | POA: Diagnosis not present

## 2018-07-09 DIAGNOSIS — Z9689 Presence of other specified functional implants: Secondary | ICD-10-CM | POA: Diagnosis not present

## 2018-07-09 DIAGNOSIS — F329 Major depressive disorder, single episode, unspecified: Secondary | ICD-10-CM | POA: Diagnosis not present

## 2018-07-09 DIAGNOSIS — E11319 Type 2 diabetes mellitus with unspecified diabetic retinopathy without macular edema: Secondary | ICD-10-CM | POA: Diagnosis not present

## 2018-07-09 DIAGNOSIS — E119 Type 2 diabetes mellitus without complications: Secondary | ICD-10-CM | POA: Diagnosis not present

## 2018-07-09 DIAGNOSIS — G8253 Quadriplegia, C5-C7 complete: Secondary | ICD-10-CM | POA: Diagnosis not present

## 2018-07-09 DIAGNOSIS — S14115D Complete lesion at C5 level of cervical spinal cord, subsequent encounter: Secondary | ICD-10-CM | POA: Diagnosis not present

## 2018-07-09 DIAGNOSIS — F419 Anxiety disorder, unspecified: Secondary | ICD-10-CM | POA: Diagnosis not present

## 2018-07-09 DIAGNOSIS — D649 Anemia, unspecified: Secondary | ICD-10-CM | POA: Diagnosis not present

## 2018-07-09 NOTE — Telephone Encounter (Signed)
Noted, will discuss at upcoming visit.

## 2018-07-09 NOTE — Telephone Encounter (Signed)
Dorene Sorrow called to give the information of the imaging company. He states for [place] to receive the results of the ultrasound we need to call and request a copy to be faxed.   [phone] ext [phone]  I called and left message to fax the ultrasound to our fax. Left fax number.

## 2018-07-09 NOTE — Telephone Encounter (Signed)
Veronica [name] from [facility] Osteoporosis Team left a vm msg on 07/06/18. Requesting an update regarding bone density testing. As per nurse, she sent paperwork via fax on 4/24. Veronica [name] said that pt will need to have another bone density testing within 6 mths of a fracture. If paperwork was not rec'd, she is requesting a call back with fax number to resend. Pls advise, thanks.

## 2018-07-09 NOTE — Telephone Encounter (Signed)
Thanks

## 2018-07-10 ENCOUNTER — Telehealth: Payer: Self-pay

## 2018-07-10 ENCOUNTER — Encounter (HOSPITAL_BASED_OUTPATIENT_CLINIC_OR_DEPARTMENT_OTHER): Payer: Medicare HMO

## 2018-07-10 ENCOUNTER — Encounter (HOSPITAL_COMMUNITY): Payer: Medicare HMO

## 2018-07-10 ENCOUNTER — Ambulatory Visit (HOSPITAL_BASED_OUTPATIENT_CLINIC_OR_DEPARTMENT_OTHER): Payer: Medicare HMO

## 2018-07-10 NOTE — Telephone Encounter (Signed)
Nynicka called - requesting to extend physical therapy for pt for 1x time for 1 wk and 1x time for 2 wks. Verbal authorization given to proceed with extended physical therapy for pt.

## 2018-07-11 ENCOUNTER — Encounter: Payer: Self-pay | Admitting: Osteopathic Medicine

## 2018-07-11 ENCOUNTER — Ambulatory Visit (INDEPENDENT_AMBULATORY_CARE_PROVIDER_SITE_OTHER): Payer: Medicare HMO | Admitting: Osteopathic Medicine

## 2018-07-11 ENCOUNTER — Telehealth: Payer: Self-pay | Admitting: Osteopathic Medicine

## 2018-07-11 DIAGNOSIS — S14115D Complete lesion at C5 level of cervical spinal cord, subsequent encounter: Secondary | ICD-10-CM | POA: Diagnosis not present

## 2018-07-11 DIAGNOSIS — I82412 Acute embolism and thrombosis of left femoral vein: Secondary | ICD-10-CM | POA: Diagnosis not present

## 2018-07-11 DIAGNOSIS — D649 Anemia, unspecified: Secondary | ICD-10-CM | POA: Diagnosis not present

## 2018-07-11 DIAGNOSIS — E11319 Type 2 diabetes mellitus with unspecified diabetic retinopathy without macular edema: Secondary | ICD-10-CM | POA: Diagnosis not present

## 2018-07-11 DIAGNOSIS — E039 Hypothyroidism, unspecified: Secondary | ICD-10-CM | POA: Diagnosis not present

## 2018-07-11 DIAGNOSIS — F329 Major depressive disorder, single episode, unspecified: Secondary | ICD-10-CM | POA: Diagnosis not present

## 2018-07-11 DIAGNOSIS — G8253 Quadriplegia, C5-C7 complete: Secondary | ICD-10-CM | POA: Diagnosis not present

## 2018-07-11 DIAGNOSIS — E119 Type 2 diabetes mellitus without complications: Secondary | ICD-10-CM | POA: Diagnosis not present

## 2018-07-11 DIAGNOSIS — F419 Anxiety disorder, unspecified: Secondary | ICD-10-CM | POA: Diagnosis not present

## 2018-07-11 DIAGNOSIS — Z9689 Presence of other specified functional implants: Secondary | ICD-10-CM | POA: Diagnosis not present

## 2018-07-11 MED ORDER — OXYCODONE HCL 5 MG PO TABS: 5.0000 mg | ORAL_TABLET | Freq: Four times a day (QID) | ORAL | 0 refills | Status: DC | PRN

## 2018-07-11 NOTE — Progress Notes (Signed)
Virtual Visit via Phone Note  I connected with      Veronica Gauzehyllis Cloyd on 07/12/18 at 3:30 by a telemedicine application and verified that I am speaking with the correct person using two identifiers.  Patient is at home I am working from home    I discussed the limitations of evaluation and management by telemedicine and the availability of in person appointments. The patient expressed understanding and agreed to proceed.  History of Present Illness: Veronica [name] is a 78 y.o. female who would like to discuss  Chief Complaint  Patient presents with  . Leg Swelling    Last night, swelling in LLE after exercise worse (known DVT), spasticity and pain were worse, couldn't move for a few hours. She states frequently swelling gets worse / intolerable after participating in PT. Hasn't mentioned this to her therapists.   Still waiting on DVT US report from recnt follow-up scan to see if clot is advanced.       Observations/Objective: BP 112/64   Pulse 68   Temp (!) 96.2 F (35.7 C) (Oral)   Resp 16  BP Readings from Last 3 Encounters:  07/11/18 112/64  07/03/18 110/70  06/18/18 (!) 75/42   Exam: Normal Speech.    Lab and Radiology Results No results found for this or any previous visit (from the past 72 hour(s)). No results found.     Assessment and Plan: 78 y.o. female with The encounter diagnosis was Acute deep vein thrombosis (DVT) of femoral vein of left lower extremity (HCC).  Will work on getting records    As of US recently 07/06/18: Right: No evidence of common femoral vein obstruction. Left: Findings consistent with acute deep vein thrombosis involving the left common femoral vein, and left femoral vein. No cystic structure found in the popliteal fossa. Unable to visualize the CIV due to bowel gas. IVC and EIV appear patent.   OK to take 10 mg oxycodone as needed btu try to stick to 5 mg  Advised to alert her PT team of the swelling issue Compression  stockings might be helpful here      PDMP not reviewed this encounter. No orders of the defined types were placed in this encounter.  Meds ordered this encounter  Medications  . oxyCODONE (OXY IR/ROXICODONE) 5 MG immediate release tablet    Sig: Take 1-2 tablets (5-10 mg total) by mouth every 6 (six) hours as needed for severe pain or breakthrough pain (#120 for 30 days).    Dispense:  120 tablet    Refill:  0   There are no Patient Instructions on file for this visit.  Instructions sent via MyChart. If MyChart not available, pt was given option for info via personal e-mail w/ no guarantee of protected health info over unsecured e-mail communication, and MyChart sign-up instructions were included.   Follow Up Instructions: Return if symptoms worsen or fail to improve.    I discussed the assessment and treatment plan with the patient. The patient was provided an opportunity to ask questions and all were answered. The patient agreed with the plan and demonstrated an understanding of the instructions.   The patient was advised to call back or seek an in-person evaluation if any new concerns, if symptoms worsen or if the condition fails to improve as anticipated.  25 minutes of non-face-to-face time was provided during this encounter.  Historical information moved to improve visibility of documentation.  Past Medical History:  Diagnosis Date  . Anemia   . Diabetic retinopathy (HCC) 03/23/2015  . GERD (gastroesophageal reflux disease) 01/05/2015  . Hypothyroid   . Osteopenia 03/03/2014  . Type 2 diabetes mellitus with microalbuminuria or microproteinuria 03/03/2014   Actos added January 2016.     Past Surgical History:  Procedure Laterality Date  . APPENDECTOMY  02/28/11  . CESAREAN SECTION    . LAPAROSCOPIC APPENDECTOMY  03/03/2011   Procedure: APPENDECTOMY LAPAROSCOPIC;  Surgeon: Valarie Merino, MD;  Location: [facility] ORS;  Service: General;   Laterality: N/A;  . TONSILLECTOMY     Social History   Tobacco Use  . Smoking status: Never Smoker  . Smokeless tobacco: Never Used  Substance Use Topics  . Alcohol use: No   family history includes Cancer in her father and mother; Diabetes in her mother; Hypertension in her mother; Stroke in her mother.  Medications: Current Outpatient Medications  Medication Sig Dispense Refill  . Accu-Chek Softclix Lancets lancets Use as instructed up to qid 200 each 99  . acetaminophen (TYLENOL) 500 MG tablet Take by mouth.    . Alcohol Swabs PADS Per insurance coverage, use as directed w/ glucose testing 120 each 99  . Baclofen 5 MG TABS Take 5 mg by mouth 3 (three) times daily as needed. 270 tablet 1  . bisacodyl (DULCOLAX) 10 MG suppository Place rectally.    . carvedilol (COREG) 6.25 MG tablet Take 1 tablet (6.25 mg total) by mouth 2 (two) times daily with a meal. 180 tablet 1  . clonazePAM (KLONOPIN) 0.5 MG tablet Take 1 tablet (0.5 mg total) by mouth 3 (three) times daily as needed for up to 30 days (#90 for thirty days). for anxiety 90 tablet 2  . doxepin (SINEQUAN) 10 MG capsule Take 1 capsule (10 mg total) by mouth at bedtime. 90 capsule 1  . DULoxetine (CYMBALTA) 60 MG capsule Take 1 capsule (60 mg total) by mouth daily. 90 capsule 1  . ELIQUIS 5 MG TABS tablet Take 1 tablet (5 mg total) by mouth 2 (two) times daily. 180 tablet 1  . gabapentin (NEURONTIN) 300 MG capsule Take 2-3 capsules (600-900 mg total) by mouth 3 (three) times daily. 540 capsule 1  . glucose blood test strip Use as instructed up to qid 200 each 99  . insulin aspart (FIASP FLEXTOUCH) 100 UNIT/ML FlexPen Inject into the skin.    [name] insulin degludec (TRESIBA) 100 UNIT/ML SOPN FlexTouch Pen Inject into the skin.    [name] insulin NPH Human (HUMULIN N,NOVOLIN N) 100 UNIT/ML injection Inject into the skin.    [name] insulin regular (NOVOLIN R,HUMULIN R) 100 units/mL injection Inject into the skin.    [name] levothyroxine (SYNTHROID,  LEVOTHROID) 88 MCG tablet Take 1 tablet (88 mcg total) by mouth daily. FOLLOWING WITH ENDOCRINOLOGY DR LEVY 1 tablet 0  . metFORMIN (GLUCOPHAGE-XR) 500 MG 24 hr tablet Take by mouth. FOLLOWING W/ ENDOCRINOLOGY DR. Shawnee Knapp    . oxyCODONE (OXY IR/ROXICODONE) 5 MG immediate release tablet Take 1-2 tablets (5-10 mg total) by mouth every 6 (six) hours as needed for severe pain or breakthrough pain (#120 for 30 days). 120 tablet 0  . ranitidine (ZANTAC) 300 MG capsule Take 1 capsule (300 mg total) by mouth every evening. 90 capsule 3   No current facility-administered medications for this visit.    Allergies  Allergen Reactions  . Dulaglutide Palpitations    SOB. N/V  .  Cephalexin Other (See Comments)    hives   . Codeine Hives and Nausea And Vomiting  . Glimepiride Hives  . Penicillins Rash  . Sitagliptin Other (See Comments)    Insomnia   . Sulfamethoxazole-Trimethoprim Hives  . Ezetimibe   . Guaifenesin   . Metformin And Related     diarrhea  . Other Other (See Comments)    All oral diabetic drugs: nausea, vomiting, dizziness Unknown antibiotic: "starts with a P" - rash  . Penicillin G Itching  . Pseudoephedrine Hcl   . Sulfa Antibiotics Hives and Nausea And Vomiting  . Sulfamethoxazole Itching    PDMP not reviewed this encounter. No orders of the defined types were placed in this encounter.  Meds ordered this encounter  Medications  . oxyCODONE (OXY IR/ROXICODONE) 5 MG immediate release tablet    Sig: Take 1-2 tablets (5-10 mg total) by mouth every 6 (six) hours as needed for severe pain or breakthrough pain (#120 for 30 days).    Dispense:  120 tablet    Refill:  0

## 2018-07-11 NOTE — Telephone Encounter (Signed)
Pt called stating her leg cramping and pain is worse. Last night the pain/cramps lasted for about 6 hours. She has been taking Rx's as directed. Pt reports it locked in a cramp for about 4.5 hours. Also reports it is swollen up, twice the size of "normal."  Added on for telephone visit today with PCP

## 2018-07-12 ENCOUNTER — Encounter: Payer: Self-pay | Admitting: Osteopathic Medicine

## 2018-07-12 DIAGNOSIS — D649 Anemia, unspecified: Secondary | ICD-10-CM | POA: Diagnosis not present

## 2018-07-12 DIAGNOSIS — G8253 Quadriplegia, C5-C7 complete: Secondary | ICD-10-CM | POA: Diagnosis not present

## 2018-07-12 DIAGNOSIS — E039 Hypothyroidism, unspecified: Secondary | ICD-10-CM | POA: Diagnosis not present

## 2018-07-12 DIAGNOSIS — I82402 Acute embolism and thrombosis of unspecified deep veins of left lower extremity: Secondary | ICD-10-CM | POA: Insufficient documentation

## 2018-07-12 DIAGNOSIS — E119 Type 2 diabetes mellitus without complications: Secondary | ICD-10-CM | POA: Diagnosis not present

## 2018-07-12 DIAGNOSIS — F419 Anxiety disorder, unspecified: Secondary | ICD-10-CM | POA: Diagnosis not present

## 2018-07-12 DIAGNOSIS — F329 Major depressive disorder, single episode, unspecified: Secondary | ICD-10-CM | POA: Diagnosis not present

## 2018-07-12 DIAGNOSIS — S14115D Complete lesion at C5 level of cervical spinal cord, subsequent encounter: Secondary | ICD-10-CM | POA: Diagnosis not present

## 2018-07-12 DIAGNOSIS — Z9689 Presence of other specified functional implants: Secondary | ICD-10-CM | POA: Diagnosis not present

## 2018-07-12 DIAGNOSIS — E11319 Type 2 diabetes mellitus with unspecified diabetic retinopathy without macular edema: Secondary | ICD-10-CM | POA: Diagnosis not present

## 2018-07-12 NOTE — Telephone Encounter (Signed)
See progress note.

## 2018-07-18 ENCOUNTER — Other Ambulatory Visit: Payer: Self-pay | Admitting: Osteopathic Medicine

## 2018-07-18 ENCOUNTER — Other Ambulatory Visit: Payer: Self-pay

## 2018-07-18 DIAGNOSIS — E11319 Type 2 diabetes mellitus with unspecified diabetic retinopathy without macular edema: Secondary | ICD-10-CM | POA: Diagnosis not present

## 2018-07-18 DIAGNOSIS — E119 Type 2 diabetes mellitus without complications: Secondary | ICD-10-CM | POA: Diagnosis not present

## 2018-07-18 DIAGNOSIS — G8253 Quadriplegia, C5-C7 complete: Secondary | ICD-10-CM | POA: Diagnosis not present

## 2018-07-18 DIAGNOSIS — F329 Major depressive disorder, single episode, unspecified: Secondary | ICD-10-CM | POA: Diagnosis not present

## 2018-07-18 DIAGNOSIS — E039 Hypothyroidism, unspecified: Secondary | ICD-10-CM | POA: Diagnosis not present

## 2018-07-18 DIAGNOSIS — S14115D Complete lesion at C5 level of cervical spinal cord, subsequent encounter: Secondary | ICD-10-CM | POA: Diagnosis not present

## 2018-07-18 DIAGNOSIS — F419 Anxiety disorder, unspecified: Secondary | ICD-10-CM | POA: Diagnosis not present

## 2018-07-18 DIAGNOSIS — Z9689 Presence of other specified functional implants: Secondary | ICD-10-CM | POA: Diagnosis not present

## 2018-07-18 DIAGNOSIS — D649 Anemia, unspecified: Secondary | ICD-10-CM | POA: Diagnosis not present

## 2018-07-18 MED ORDER — OXYCODONE HCL 5 MG PO TABS: 5.0000 mg | ORAL_TABLET | Freq: Four times a day (QID) | ORAL | 0 refills | Status: DC | PRN

## 2018-07-18 NOTE — Telephone Encounter (Signed)
[name] called and left a message for a refill. The last refill on 07/11/18 wasn't sent in, it was on no print.

## 2018-07-19 DIAGNOSIS — S14115D Complete lesion at C5 level of cervical spinal cord, subsequent encounter: Secondary | ICD-10-CM | POA: Diagnosis not present

## 2018-07-19 DIAGNOSIS — F419 Anxiety disorder, unspecified: Secondary | ICD-10-CM | POA: Diagnosis not present

## 2018-07-19 DIAGNOSIS — F329 Major depressive disorder, single episode, unspecified: Secondary | ICD-10-CM | POA: Diagnosis not present

## 2018-07-19 DIAGNOSIS — G8253 Quadriplegia, C5-C7 complete: Secondary | ICD-10-CM | POA: Diagnosis not present

## 2018-07-19 DIAGNOSIS — D649 Anemia, unspecified: Secondary | ICD-10-CM | POA: Diagnosis not present

## 2018-07-19 DIAGNOSIS — E119 Type 2 diabetes mellitus without complications: Secondary | ICD-10-CM | POA: Diagnosis not present

## 2018-07-19 DIAGNOSIS — E039 Hypothyroidism, unspecified: Secondary | ICD-10-CM | POA: Diagnosis not present

## 2018-07-19 DIAGNOSIS — Z9689 Presence of other specified functional implants: Secondary | ICD-10-CM | POA: Diagnosis not present

## 2018-07-19 DIAGNOSIS — E11319 Type 2 diabetes mellitus with unspecified diabetic retinopathy without macular edema: Secondary | ICD-10-CM | POA: Diagnosis not present

## 2018-07-20 DIAGNOSIS — S14115D Complete lesion at C5 level of cervical spinal cord, subsequent encounter: Secondary | ICD-10-CM | POA: Diagnosis not present

## 2018-07-20 DIAGNOSIS — F329 Major depressive disorder, single episode, unspecified: Secondary | ICD-10-CM | POA: Diagnosis not present

## 2018-07-20 DIAGNOSIS — Z9689 Presence of other specified functional implants: Secondary | ICD-10-CM | POA: Diagnosis not present

## 2018-07-20 DIAGNOSIS — E119 Type 2 diabetes mellitus without complications: Secondary | ICD-10-CM | POA: Diagnosis not present

## 2018-07-20 DIAGNOSIS — D649 Anemia, unspecified: Secondary | ICD-10-CM | POA: Diagnosis not present

## 2018-07-20 DIAGNOSIS — G8253 Quadriplegia, C5-C7 complete: Secondary | ICD-10-CM | POA: Diagnosis not present

## 2018-07-20 DIAGNOSIS — F419 Anxiety disorder, unspecified: Secondary | ICD-10-CM | POA: Diagnosis not present

## 2018-07-20 DIAGNOSIS — E11319 Type 2 diabetes mellitus with unspecified diabetic retinopathy without macular edema: Secondary | ICD-10-CM | POA: Diagnosis not present

## 2018-07-20 DIAGNOSIS — E039 Hypothyroidism, unspecified: Secondary | ICD-10-CM | POA: Diagnosis not present

## 2018-07-23 DIAGNOSIS — D649 Anemia, unspecified: Secondary | ICD-10-CM | POA: Diagnosis not present

## 2018-07-23 DIAGNOSIS — F419 Anxiety disorder, unspecified: Secondary | ICD-10-CM | POA: Diagnosis not present

## 2018-07-23 DIAGNOSIS — F329 Major depressive disorder, single episode, unspecified: Secondary | ICD-10-CM | POA: Diagnosis not present

## 2018-07-23 DIAGNOSIS — E11319 Type 2 diabetes mellitus with unspecified diabetic retinopathy without macular edema: Secondary | ICD-10-CM | POA: Diagnosis not present

## 2018-07-23 DIAGNOSIS — G8253 Quadriplegia, C5-C7 complete: Secondary | ICD-10-CM | POA: Diagnosis not present

## 2018-07-23 DIAGNOSIS — S14115D Complete lesion at C5 level of cervical spinal cord, subsequent encounter: Secondary | ICD-10-CM | POA: Diagnosis not present

## 2018-07-23 DIAGNOSIS — Z9689 Presence of other specified functional implants: Secondary | ICD-10-CM | POA: Diagnosis not present

## 2018-07-23 DIAGNOSIS — E119 Type 2 diabetes mellitus without complications: Secondary | ICD-10-CM | POA: Diagnosis not present

## 2018-07-23 DIAGNOSIS — E039 Hypothyroidism, unspecified: Secondary | ICD-10-CM | POA: Diagnosis not present

## 2018-07-24 ENCOUNTER — Encounter: Payer: Self-pay | Admitting: Osteopathic Medicine

## 2018-07-24 ENCOUNTER — Telehealth: Payer: Self-pay

## 2018-07-24 NOTE — Telephone Encounter (Signed)
OK letter printed

## 2018-07-24 NOTE — Telephone Encounter (Signed)
Patient advised. Letter is up front ready for pick up.

## 2018-07-24 NOTE — Telephone Encounter (Signed)
Veronica [name]'s husband called and states she is wanting to have a massage in her home. The massage therapist needs a letter stating it is ok for patient to have a massage.

## 2018-07-26 DIAGNOSIS — E039 Hypothyroidism, unspecified: Secondary | ICD-10-CM | POA: Diagnosis not present

## 2018-07-26 DIAGNOSIS — E119 Type 2 diabetes mellitus without complications: Secondary | ICD-10-CM | POA: Diagnosis not present

## 2018-07-26 DIAGNOSIS — D649 Anemia, unspecified: Secondary | ICD-10-CM | POA: Diagnosis not present

## 2018-07-26 DIAGNOSIS — G8253 Quadriplegia, C5-C7 complete: Secondary | ICD-10-CM | POA: Diagnosis not present

## 2018-07-26 DIAGNOSIS — F329 Major depressive disorder, single episode, unspecified: Secondary | ICD-10-CM | POA: Diagnosis not present

## 2018-07-26 DIAGNOSIS — E11319 Type 2 diabetes mellitus with unspecified diabetic retinopathy without macular edema: Secondary | ICD-10-CM | POA: Diagnosis not present

## 2018-07-26 DIAGNOSIS — Z9689 Presence of other specified functional implants: Secondary | ICD-10-CM | POA: Diagnosis not present

## 2018-07-26 DIAGNOSIS — F419 Anxiety disorder, unspecified: Secondary | ICD-10-CM | POA: Diagnosis not present

## 2018-07-26 DIAGNOSIS — S14115D Complete lesion at C5 level of cervical spinal cord, subsequent encounter: Secondary | ICD-10-CM | POA: Diagnosis not present

## 2018-07-30 DIAGNOSIS — Z9689 Presence of other specified functional implants: Secondary | ICD-10-CM | POA: Diagnosis not present

## 2018-07-30 DIAGNOSIS — F329 Major depressive disorder, single episode, unspecified: Secondary | ICD-10-CM | POA: Diagnosis not present

## 2018-07-30 DIAGNOSIS — E11319 Type 2 diabetes mellitus with unspecified diabetic retinopathy without macular edema: Secondary | ICD-10-CM | POA: Diagnosis not present

## 2018-07-30 DIAGNOSIS — G8253 Quadriplegia, C5-C7 complete: Secondary | ICD-10-CM | POA: Diagnosis not present

## 2018-07-30 DIAGNOSIS — D649 Anemia, unspecified: Secondary | ICD-10-CM | POA: Diagnosis not present

## 2018-07-30 DIAGNOSIS — S14115D Complete lesion at C5 level of cervical spinal cord, subsequent encounter: Secondary | ICD-10-CM | POA: Diagnosis not present

## 2018-07-30 DIAGNOSIS — E119 Type 2 diabetes mellitus without complications: Secondary | ICD-10-CM | POA: Diagnosis not present

## 2018-07-30 DIAGNOSIS — E039 Hypothyroidism, unspecified: Secondary | ICD-10-CM | POA: Diagnosis not present

## 2018-07-30 DIAGNOSIS — F419 Anxiety disorder, unspecified: Secondary | ICD-10-CM | POA: Diagnosis not present

## 2018-07-31 DIAGNOSIS — G8250 Quadriplegia, unspecified: Secondary | ICD-10-CM | POA: Diagnosis not present

## 2018-08-01 ENCOUNTER — Telehealth: Payer: Self-pay

## 2018-08-01 ENCOUNTER — Telehealth (INDEPENDENT_AMBULATORY_CARE_PROVIDER_SITE_OTHER): Payer: Medicare HMO | Admitting: Osteopathic Medicine

## 2018-08-01 DIAGNOSIS — E039 Hypothyroidism, unspecified: Secondary | ICD-10-CM | POA: Diagnosis not present

## 2018-08-01 DIAGNOSIS — D649 Anemia, unspecified: Secondary | ICD-10-CM | POA: Diagnosis not present

## 2018-08-01 DIAGNOSIS — F329 Major depressive disorder, single episode, unspecified: Secondary | ICD-10-CM | POA: Diagnosis not present

## 2018-08-01 DIAGNOSIS — G8253 Quadriplegia, C5-C7 complete: Secondary | ICD-10-CM | POA: Diagnosis not present

## 2018-08-01 DIAGNOSIS — Z9689 Presence of other specified functional implants: Secondary | ICD-10-CM | POA: Diagnosis not present

## 2018-08-01 DIAGNOSIS — M21339 Wrist drop, unspecified wrist: Secondary | ICD-10-CM

## 2018-08-01 DIAGNOSIS — Z419 Encounter for procedure for purposes other than remedying health state, unspecified: Secondary | ICD-10-CM | POA: Diagnosis not present

## 2018-08-01 DIAGNOSIS — S14115D Complete lesion at C5 level of cervical spinal cord, subsequent encounter: Secondary | ICD-10-CM | POA: Diagnosis not present

## 2018-08-01 DIAGNOSIS — E11319 Type 2 diabetes mellitus with unspecified diabetic retinopathy without macular edema: Secondary | ICD-10-CM | POA: Diagnosis not present

## 2018-08-01 DIAGNOSIS — E119 Type 2 diabetes mellitus without complications: Secondary | ICD-10-CM | POA: Diagnosis not present

## 2018-08-01 DIAGNOSIS — F419 Anxiety disorder, unspecified: Secondary | ICD-10-CM | POA: Diagnosis not present

## 2018-08-01 NOTE — Telephone Encounter (Signed)
Wants a referral to a hand therapist. Was advised of a great specialist in [place](didn't specify)   Please advise.

## 2018-08-01 NOTE — Telephone Encounter (Signed)
Forwarding to provider for review.

## 2018-08-01 NOTE — Telephone Encounter (Signed)
Amy from [facility] called requesting a verbal order to extend home care for pt. As per Amy, verbal order is for 1x time/4 weeks and once q 2 wks/4 weeks for a total of six visits. Attempted to contact nurse, no answer. Left a detailed vm msg to proceed with verbal order on extension for home care. Direct call back info provided.

## 2018-08-02 NOTE — Telephone Encounter (Signed)
I need to know what symptom or condition is bothering her before I can place the referral, can we call her to see what is going on and what specifically she would like the hand specialist to address?

## 2018-08-02 NOTE — Telephone Encounter (Signed)
Called patient and informed via VM to call office back to let us know what symptoms she is having with her hand. KG LPN

## 2018-08-02 NOTE — Telephone Encounter (Signed)
Veronica [name] states she is unable to use both hands. She states she has wrist drop. She would like a referral for PT/OT in hopes to regain use of her hands. Please advise.    [facility] is the Home [facility].

## 2018-08-03 NOTE — Telephone Encounter (Signed)
Pt stated that she has had PT here before, and was very pleased with the therapist in this building.  She said that when [facility] came all they did was massage her arms and hands.  She is ok with coming to this building for therapy if her insurance would cover it.

## 2018-08-03 NOTE — Telephone Encounter (Signed)
Pt advised.

## 2018-08-03 NOTE — Telephone Encounter (Signed)
Okay, I thought she was asking to see a hand surgeon, who could probably not help with this issue.    I believe she is already getting home physical therapy, she should discuss this with her therapist and they can send me an order if needed.  Or we can call her home health company and give verbal order for PT/OT for hand weakness/wrist drop?

## 2018-08-03 NOTE — Addendum Note (Signed)
Addended by: Maryla Morrow on: 08/03/2018 02:47 PM   Modules accepted: Orders

## 2018-08-03 NOTE — Telephone Encounter (Signed)
Ok order is in  Total time spent 11 mins

## 2018-08-06 ENCOUNTER — Other Ambulatory Visit: Payer: Self-pay | Admitting: Osteopathic Medicine

## 2018-08-06 ENCOUNTER — Telehealth: Payer: Self-pay

## 2018-08-06 DIAGNOSIS — G8253 Quadriplegia, C5-C7 complete: Secondary | ICD-10-CM | POA: Diagnosis not present

## 2018-08-06 DIAGNOSIS — E039 Hypothyroidism, unspecified: Secondary | ICD-10-CM | POA: Diagnosis not present

## 2018-08-06 DIAGNOSIS — E11319 Type 2 diabetes mellitus with unspecified diabetic retinopathy without macular edema: Secondary | ICD-10-CM | POA: Diagnosis not present

## 2018-08-06 DIAGNOSIS — E119 Type 2 diabetes mellitus without complications: Secondary | ICD-10-CM | POA: Diagnosis not present

## 2018-08-06 DIAGNOSIS — D649 Anemia, unspecified: Secondary | ICD-10-CM | POA: Diagnosis not present

## 2018-08-06 DIAGNOSIS — Z9689 Presence of other specified functional implants: Secondary | ICD-10-CM | POA: Diagnosis not present

## 2018-08-06 DIAGNOSIS — T148XXA Other injury of unspecified body region, initial encounter: Secondary | ICD-10-CM

## 2018-08-06 DIAGNOSIS — F419 Anxiety disorder, unspecified: Secondary | ICD-10-CM | POA: Diagnosis not present

## 2018-08-06 DIAGNOSIS — F329 Major depressive disorder, single episode, unspecified: Secondary | ICD-10-CM | POA: Diagnosis not present

## 2018-08-06 DIAGNOSIS — S14115D Complete lesion at C5 level of cervical spinal cord, subsequent encounter: Secondary | ICD-10-CM | POA: Diagnosis not present

## 2018-08-06 NOTE — Telephone Encounter (Signed)
Pt has been informed of referral order and updated regarding information from [facility] Osteoporosis team. Pt had no other inquiries during call.

## 2018-08-06 NOTE — Telephone Encounter (Signed)
Second message - April from [facility] Osteoporosis Team left a vm msg. Requesting an update regarding bone density testing. As per nurse, she sent paperwork via fax on 4/24. April said that in order to keep with compliances, pt will need to have another bone density testing within 6 mths of a fracture. Pls advise, thanks.

## 2018-08-06 NOTE — Telephone Encounter (Signed)
OK order is in. I don't think a bone density test is urgent, given pandemic and given patient's mobility challenges right now w/ rehabbing quadriplegia - I think she can have this test done when she feels up to it, no rush. Can call patient and update her on the plan.

## 2018-08-09 DIAGNOSIS — F329 Major depressive disorder, single episode, unspecified: Secondary | ICD-10-CM | POA: Diagnosis not present

## 2018-08-09 DIAGNOSIS — E119 Type 2 diabetes mellitus without complications: Secondary | ICD-10-CM | POA: Diagnosis not present

## 2018-08-09 DIAGNOSIS — G8253 Quadriplegia, C5-C7 complete: Secondary | ICD-10-CM | POA: Diagnosis not present

## 2018-08-09 DIAGNOSIS — D649 Anemia, unspecified: Secondary | ICD-10-CM | POA: Diagnosis not present

## 2018-08-09 DIAGNOSIS — E11319 Type 2 diabetes mellitus with unspecified diabetic retinopathy without macular edema: Secondary | ICD-10-CM | POA: Diagnosis not present

## 2018-08-09 DIAGNOSIS — F419 Anxiety disorder, unspecified: Secondary | ICD-10-CM | POA: Diagnosis not present

## 2018-08-09 DIAGNOSIS — E039 Hypothyroidism, unspecified: Secondary | ICD-10-CM | POA: Diagnosis not present

## 2018-08-09 DIAGNOSIS — Z9689 Presence of other specified functional implants: Secondary | ICD-10-CM | POA: Diagnosis not present

## 2018-08-09 DIAGNOSIS — S14115D Complete lesion at C5 level of cervical spinal cord, subsequent encounter: Secondary | ICD-10-CM | POA: Diagnosis not present

## 2018-08-10 DIAGNOSIS — E11319 Type 2 diabetes mellitus with unspecified diabetic retinopathy without macular edema: Secondary | ICD-10-CM | POA: Diagnosis not present

## 2018-08-10 DIAGNOSIS — Z9689 Presence of other specified functional implants: Secondary | ICD-10-CM | POA: Diagnosis not present

## 2018-08-10 DIAGNOSIS — S14115D Complete lesion at C5 level of cervical spinal cord, subsequent encounter: Secondary | ICD-10-CM | POA: Diagnosis not present

## 2018-08-10 DIAGNOSIS — G8253 Quadriplegia, C5-C7 complete: Secondary | ICD-10-CM | POA: Diagnosis not present

## 2018-08-10 DIAGNOSIS — E119 Type 2 diabetes mellitus without complications: Secondary | ICD-10-CM | POA: Diagnosis not present

## 2018-08-10 DIAGNOSIS — E039 Hypothyroidism, unspecified: Secondary | ICD-10-CM | POA: Diagnosis not present

## 2018-08-10 DIAGNOSIS — D649 Anemia, unspecified: Secondary | ICD-10-CM | POA: Diagnosis not present

## 2018-08-10 DIAGNOSIS — F419 Anxiety disorder, unspecified: Secondary | ICD-10-CM | POA: Diagnosis not present

## 2018-08-10 DIAGNOSIS — F329 Major depressive disorder, single episode, unspecified: Secondary | ICD-10-CM | POA: Diagnosis not present

## 2018-08-13 DIAGNOSIS — Z981 Arthrodesis status: Secondary | ICD-10-CM | POA: Diagnosis not present

## 2018-08-13 DIAGNOSIS — S14124D Central cord syndrome at C4 level of cervical spinal cord, subsequent encounter: Secondary | ICD-10-CM | POA: Diagnosis not present

## 2018-08-13 DIAGNOSIS — S14104D Unspecified injury at C4 level of cervical spinal cord, subsequent encounter: Secondary | ICD-10-CM | POA: Diagnosis not present

## 2018-08-15 ENCOUNTER — Telehealth: Payer: Self-pay

## 2018-08-15 DIAGNOSIS — S14115D Complete lesion at C5 level of cervical spinal cord, subsequent encounter: Secondary | ICD-10-CM | POA: Diagnosis not present

## 2018-08-15 DIAGNOSIS — F329 Major depressive disorder, single episode, unspecified: Secondary | ICD-10-CM | POA: Diagnosis not present

## 2018-08-15 DIAGNOSIS — E11319 Type 2 diabetes mellitus with unspecified diabetic retinopathy without macular edema: Secondary | ICD-10-CM | POA: Diagnosis not present

## 2018-08-15 DIAGNOSIS — E039 Hypothyroidism, unspecified: Secondary | ICD-10-CM | POA: Diagnosis not present

## 2018-08-15 DIAGNOSIS — F419 Anxiety disorder, unspecified: Secondary | ICD-10-CM | POA: Diagnosis not present

## 2018-08-15 DIAGNOSIS — G8253 Quadriplegia, C5-C7 complete: Secondary | ICD-10-CM | POA: Diagnosis not present

## 2018-08-15 DIAGNOSIS — E119 Type 2 diabetes mellitus without complications: Secondary | ICD-10-CM | POA: Diagnosis not present

## 2018-08-15 DIAGNOSIS — D649 Anemia, unspecified: Secondary | ICD-10-CM | POA: Diagnosis not present

## 2018-08-15 DIAGNOSIS — Z9689 Presence of other specified functional implants: Secondary | ICD-10-CM | POA: Diagnosis not present

## 2018-08-15 NOTE — Telephone Encounter (Signed)
Amy from [facility] home [facility] called - requesting a B-12 rx to be sent to [facility] [place]. As per RN, pt would like to get her B-12 injections at home with visiting nurse. Amy also needs an order for administering the B-12. Pls advise, thanks.

## 2018-08-16 NOTE — Telephone Encounter (Signed)
Called and spoke with Amy. She states that patient was confused as to whether her PCP or her neurosurgeon had recommended B12 shots. I advised Amy she can try neurosurgeon to see if he will give OK and if not, she has VO to draw B12 to check levels.

## 2018-08-16 NOTE — Telephone Encounter (Signed)
Can do verbal order for B12 lab draw and we will see if she actually needs B12.

## 2018-08-17 ENCOUNTER — Telehealth: Payer: Self-pay

## 2018-08-17 DIAGNOSIS — E039 Hypothyroidism, unspecified: Secondary | ICD-10-CM | POA: Diagnosis not present

## 2018-08-17 DIAGNOSIS — S14115D Complete lesion at C5 level of cervical spinal cord, subsequent encounter: Secondary | ICD-10-CM | POA: Diagnosis not present

## 2018-08-17 DIAGNOSIS — Z9689 Presence of other specified functional implants: Secondary | ICD-10-CM | POA: Diagnosis not present

## 2018-08-17 DIAGNOSIS — G8253 Quadriplegia, C5-C7 complete: Secondary | ICD-10-CM | POA: Diagnosis not present

## 2018-08-17 DIAGNOSIS — F419 Anxiety disorder, unspecified: Secondary | ICD-10-CM | POA: Diagnosis not present

## 2018-08-17 DIAGNOSIS — D649 Anemia, unspecified: Secondary | ICD-10-CM | POA: Diagnosis not present

## 2018-08-17 DIAGNOSIS — F329 Major depressive disorder, single episode, unspecified: Secondary | ICD-10-CM | POA: Diagnosis not present

## 2018-08-17 DIAGNOSIS — E119 Type 2 diabetes mellitus without complications: Secondary | ICD-10-CM | POA: Diagnosis not present

## 2018-08-17 DIAGNOSIS — E11319 Type 2 diabetes mellitus with unspecified diabetic retinopathy without macular edema: Secondary | ICD-10-CM | POA: Diagnosis not present

## 2018-08-17 NOTE — Telephone Encounter (Signed)
DME company states there is two different instructions for the catheters. One states to change once daily the other states to change twice daily. They need to know which one is correct. If the twice daily is correct then the office note will need to be updated to reflect twice daily.

## 2018-08-21 DIAGNOSIS — Z9689 Presence of other specified functional implants: Secondary | ICD-10-CM | POA: Diagnosis not present

## 2018-08-21 DIAGNOSIS — E11319 Type 2 diabetes mellitus with unspecified diabetic retinopathy without macular edema: Secondary | ICD-10-CM | POA: Diagnosis not present

## 2018-08-21 DIAGNOSIS — S14115D Complete lesion at C5 level of cervical spinal cord, subsequent encounter: Secondary | ICD-10-CM | POA: Diagnosis not present

## 2018-08-21 DIAGNOSIS — F329 Major depressive disorder, single episode, unspecified: Secondary | ICD-10-CM | POA: Diagnosis not present

## 2018-08-21 DIAGNOSIS — E039 Hypothyroidism, unspecified: Secondary | ICD-10-CM | POA: Diagnosis not present

## 2018-08-21 DIAGNOSIS — G8253 Quadriplegia, C5-C7 complete: Secondary | ICD-10-CM | POA: Diagnosis not present

## 2018-08-21 DIAGNOSIS — D649 Anemia, unspecified: Secondary | ICD-10-CM | POA: Diagnosis not present

## 2018-08-21 DIAGNOSIS — F419 Anxiety disorder, unspecified: Secondary | ICD-10-CM | POA: Diagnosis not present

## 2018-08-21 DIAGNOSIS — E119 Type 2 diabetes mellitus without complications: Secondary | ICD-10-CM | POA: Diagnosis not present

## 2018-08-22 DIAGNOSIS — F419 Anxiety disorder, unspecified: Secondary | ICD-10-CM | POA: Diagnosis not present

## 2018-08-22 DIAGNOSIS — Z9689 Presence of other specified functional implants: Secondary | ICD-10-CM | POA: Diagnosis not present

## 2018-08-22 DIAGNOSIS — D649 Anemia, unspecified: Secondary | ICD-10-CM | POA: Diagnosis not present

## 2018-08-22 DIAGNOSIS — F329 Major depressive disorder, single episode, unspecified: Secondary | ICD-10-CM | POA: Diagnosis not present

## 2018-08-22 DIAGNOSIS — S14115D Complete lesion at C5 level of cervical spinal cord, subsequent encounter: Secondary | ICD-10-CM | POA: Diagnosis not present

## 2018-08-22 DIAGNOSIS — E119 Type 2 diabetes mellitus without complications: Secondary | ICD-10-CM | POA: Diagnosis not present

## 2018-08-22 DIAGNOSIS — G8253 Quadriplegia, C5-C7 complete: Secondary | ICD-10-CM | POA: Diagnosis not present

## 2018-08-22 DIAGNOSIS — E039 Hypothyroidism, unspecified: Secondary | ICD-10-CM | POA: Diagnosis not present

## 2018-08-22 DIAGNOSIS — E11319 Type 2 diabetes mellitus with unspecified diabetic retinopathy without macular edema: Secondary | ICD-10-CM | POA: Diagnosis not present

## 2018-08-23 DIAGNOSIS — Z9689 Presence of other specified functional implants: Secondary | ICD-10-CM | POA: Diagnosis not present

## 2018-08-23 DIAGNOSIS — F329 Major depressive disorder, single episode, unspecified: Secondary | ICD-10-CM | POA: Diagnosis not present

## 2018-08-23 DIAGNOSIS — E039 Hypothyroidism, unspecified: Secondary | ICD-10-CM | POA: Diagnosis not present

## 2018-08-23 DIAGNOSIS — G8253 Quadriplegia, C5-C7 complete: Secondary | ICD-10-CM | POA: Diagnosis not present

## 2018-08-23 DIAGNOSIS — S14115D Complete lesion at C5 level of cervical spinal cord, subsequent encounter: Secondary | ICD-10-CM | POA: Diagnosis not present

## 2018-08-23 DIAGNOSIS — D649 Anemia, unspecified: Secondary | ICD-10-CM | POA: Diagnosis not present

## 2018-08-23 DIAGNOSIS — E11319 Type 2 diabetes mellitus with unspecified diabetic retinopathy without macular edema: Secondary | ICD-10-CM | POA: Diagnosis not present

## 2018-08-23 DIAGNOSIS — E119 Type 2 diabetes mellitus without complications: Secondary | ICD-10-CM | POA: Diagnosis not present

## 2018-08-23 DIAGNOSIS — F419 Anxiety disorder, unspecified: Secondary | ICD-10-CM | POA: Diagnosis not present

## 2018-08-27 ENCOUNTER — Telehealth: Payer: Self-pay

## 2018-08-27 DIAGNOSIS — D649 Anemia, unspecified: Secondary | ICD-10-CM | POA: Diagnosis not present

## 2018-08-27 DIAGNOSIS — R399 Unspecified symptoms and signs involving the genitourinary system: Secondary | ICD-10-CM

## 2018-08-27 DIAGNOSIS — E119 Type 2 diabetes mellitus without complications: Secondary | ICD-10-CM | POA: Diagnosis not present

## 2018-08-27 DIAGNOSIS — Z9689 Presence of other specified functional implants: Secondary | ICD-10-CM | POA: Diagnosis not present

## 2018-08-27 DIAGNOSIS — S14115D Complete lesion at C5 level of cervical spinal cord, subsequent encounter: Secondary | ICD-10-CM | POA: Diagnosis not present

## 2018-08-27 DIAGNOSIS — F329 Major depressive disorder, single episode, unspecified: Secondary | ICD-10-CM | POA: Diagnosis not present

## 2018-08-27 DIAGNOSIS — F419 Anxiety disorder, unspecified: Secondary | ICD-10-CM | POA: Diagnosis not present

## 2018-08-27 DIAGNOSIS — G8253 Quadriplegia, C5-C7 complete: Secondary | ICD-10-CM | POA: Diagnosis not present

## 2018-08-27 DIAGNOSIS — E039 Hypothyroidism, unspecified: Secondary | ICD-10-CM | POA: Diagnosis not present

## 2018-08-27 DIAGNOSIS — E11319 Type 2 diabetes mellitus with unspecified diabetic retinopathy without macular edema: Secondary | ICD-10-CM | POA: Diagnosis not present

## 2018-08-27 NOTE — Telephone Encounter (Signed)
[name]'s husband agreed to pick up a cup and return it to the office for a urine culture. I will let the front office know to expect the sample. We will need to put it in a urine culture tube once we receive the sample.

## 2018-08-27 NOTE — Telephone Encounter (Signed)
Would have home health collect sample and send for culture before we start antibiotics. Can we call and give them verbal order for urine culture for DX UTI symptoms, thanks!

## 2018-08-27 NOTE — Telephone Encounter (Signed)
Veronica [name] called and left a message stating she has UTI Sx. She states she did a test and had leukocytes. She does have some type of home health. She is requesting an antibiotic.

## 2018-08-28 ENCOUNTER — Other Ambulatory Visit: Payer: Self-pay | Admitting: Osteopathic Medicine

## 2018-08-28 DIAGNOSIS — N390 Urinary tract infection, site not specified: Secondary | ICD-10-CM

## 2018-08-28 LAB — URINALYSIS, MICROSCOPIC ONLY: Hyaline Cast: NONE SEEN /LPF

## 2018-08-28 LAB — URINE CULTURE
MICRO NUMBER:: 637497
SPECIMEN QUALITY:: ADEQUATE

## 2018-08-28 MED ORDER — CIPROFLOXACIN HCL 500 MG PO TABS: 500.0000 mg | ORAL_TABLET | Freq: Two times a day (BID) | ORAL | 0 refills | Status: DC

## 2018-08-29 DIAGNOSIS — G8253 Quadriplegia, C5-C7 complete: Secondary | ICD-10-CM | POA: Diagnosis not present

## 2018-08-29 DIAGNOSIS — F329 Major depressive disorder, single episode, unspecified: Secondary | ICD-10-CM | POA: Diagnosis not present

## 2018-08-29 DIAGNOSIS — F419 Anxiety disorder, unspecified: Secondary | ICD-10-CM | POA: Diagnosis not present

## 2018-08-29 DIAGNOSIS — E039 Hypothyroidism, unspecified: Secondary | ICD-10-CM | POA: Diagnosis not present

## 2018-08-29 DIAGNOSIS — E119 Type 2 diabetes mellitus without complications: Secondary | ICD-10-CM | POA: Diagnosis not present

## 2018-08-29 DIAGNOSIS — E11319 Type 2 diabetes mellitus with unspecified diabetic retinopathy without macular edema: Secondary | ICD-10-CM | POA: Diagnosis not present

## 2018-08-29 DIAGNOSIS — D649 Anemia, unspecified: Secondary | ICD-10-CM | POA: Diagnosis not present

## 2018-08-29 DIAGNOSIS — Z9689 Presence of other specified functional implants: Secondary | ICD-10-CM | POA: Diagnosis not present

## 2018-08-29 DIAGNOSIS — S14115D Complete lesion at C5 level of cervical spinal cord, subsequent encounter: Secondary | ICD-10-CM | POA: Diagnosis not present

## 2018-08-30 ENCOUNTER — Telehealth: Payer: Self-pay

## 2018-08-30 DIAGNOSIS — G8250 Quadriplegia, unspecified: Secondary | ICD-10-CM | POA: Diagnosis not present

## 2018-08-30 NOTE — Telephone Encounter (Signed)
Veronica [name], can we make sure that a peer to peer is required and not a Prior Auth?  If they need to do a peer to peer review usually this can be a scheduled call, can walk a time on my schedule like we would for a virtual visit, ok to do sometime next week and give them my cell phone number

## 2018-08-30 NOTE — Telephone Encounter (Signed)
Pt's husband called stating that provider peer review is required for pt to get [place] unit. Pt has been using the unit for the past 2 mths and it's the only one that work with her nocturia. Pls call [phone].  Thanks.

## 2018-08-31 MED ORDER — AMBULATORY NON FORMULARY MEDICATION: 99 refills | Status: AC

## 2018-08-31 NOTE — Telephone Encounter (Signed)
If there is a specific frequency or other instruction on the prescription that will get this covered, I would love for them to tell me what it is.  I have written "prn urination" and I cannot imagine any other frequency that would be appropriate on the prescription.  I cannot find any prescription templates or instructions that specify if a device needs to be changed with every urination or not.  Printed Rc again, okay to fax.

## 2018-08-31 NOTE — Telephone Encounter (Signed)
Can you sign this and I will fax next week? Please advise.

## 2018-08-31 NOTE — Telephone Encounter (Signed)
I called and spoke with [place] at [facility] and she reports that the frequency and how often the patient will need the device. Please advise.

## 2018-09-03 NOTE — Telephone Encounter (Signed)
Thanks

## 2018-09-04 NOTE — Telephone Encounter (Signed)
I have re-faxed the order per Dr. Sheppard Coil for the patient so she can get her DME.

## 2018-09-04 NOTE — Telephone Encounter (Signed)
Faxed

## 2018-09-04 NOTE — Telephone Encounter (Signed)
[facility] [employer] called back and state the office note has to have an absolute number of daily use. I called [name] and she only uses 1 nightly. The office note needs to be faxed after updated.

## 2018-09-04 NOTE — Telephone Encounter (Signed)
Done

## 2018-09-05 ENCOUNTER — Telehealth: Payer: Self-pay | Admitting: Rehabilitative and Restorative Service Providers"

## 2018-09-05 ENCOUNTER — Encounter: Payer: Self-pay | Admitting: Rehabilitative and Restorative Service Providers"

## 2018-09-05 ENCOUNTER — Encounter (INDEPENDENT_AMBULATORY_CARE_PROVIDER_SITE_OTHER): Payer: Self-pay

## 2018-09-05 ENCOUNTER — Ambulatory Visit (INDEPENDENT_AMBULATORY_CARE_PROVIDER_SITE_OTHER): Payer: Medicare HMO | Admitting: Rehabilitative and Restorative Service Providers"

## 2018-09-05 ENCOUNTER — Other Ambulatory Visit: Payer: Self-pay

## 2018-09-05 DIAGNOSIS — R29818 Other symptoms and signs involving the nervous system: Secondary | ICD-10-CM

## 2018-09-05 DIAGNOSIS — S14154S Other incomplete lesion at C4 level of cervical spinal cord, sequela: Secondary | ICD-10-CM

## 2018-09-05 NOTE — Therapy (Signed)
[place] [address] [place], [place], [address] Phone: [phone]   Fax:  [phone]  Patient Details  Name: [name] MRN: [id number] Date of Birth: 09/06/1940 Referring Provider:  Emeterio Reeve, DO  Encounter Date: 09/05/2018  We received your referral for Veronica [name].  She needs a multidisciplinary treatment team which [facility] can provide at our neuro clinic in [place]. We discussed this with Lelon Frohlich today and she is agreeable with seeing the therapists there.   Referral: PT and OT consult  Diagnosis:  Incomplete C4 Spinal cord injury  Workqueue: [facility]   She would also benefit from referrals to Neurosurgery and a Physical Medicine Specialist so she can be followed by both those disciplines.She is requesting these referrals be with [facility] physicians.   Shelise Maron Nilda Simmer 09/05/2018, 12:37 PM  [facility] [place], [place], [address] Phone: [phone]   Fax:  [phone]

## 2018-09-05 NOTE — Telephone Encounter (Signed)
Dr Sheppard Coil,   We received your referral for [facility].  She needs a multidisciplinary treatment team which [facility] can provide at our neuro clinic in [place]. We discussed this with Lelon Frohlich today and she is agreeable with seeing the therapists there.   Referral: PT and OT consult  Diagnosis:  Incomplete C4 Spinal cord injury  Workqueue: [facility]   She would also benefit from referrals to Neurosurgery and a Physical Medicine Specialist so she can be followed by both those disciplines.She is requesting these referrals be with [facility] physicians.   Let me know if you need any additional information.     Thanks, Albertine Lafoy P. Helene Kelp PT, MPH 09/05/18 12:34 PM

## 2018-09-07 ENCOUNTER — Telehealth: Payer: Self-pay

## 2018-09-07 DIAGNOSIS — E039 Hypothyroidism, unspecified: Secondary | ICD-10-CM | POA: Diagnosis not present

## 2018-09-07 DIAGNOSIS — G8253 Quadriplegia, C5-C7 complete: Secondary | ICD-10-CM | POA: Diagnosis not present

## 2018-09-07 DIAGNOSIS — D649 Anemia, unspecified: Secondary | ICD-10-CM | POA: Diagnosis not present

## 2018-09-07 DIAGNOSIS — S14115D Complete lesion at C5 level of cervical spinal cord, subsequent encounter: Secondary | ICD-10-CM | POA: Diagnosis not present

## 2018-09-07 DIAGNOSIS — Z9689 Presence of other specified functional implants: Secondary | ICD-10-CM | POA: Diagnosis not present

## 2018-09-07 DIAGNOSIS — E11319 Type 2 diabetes mellitus with unspecified diabetic retinopathy without macular edema: Secondary | ICD-10-CM | POA: Diagnosis not present

## 2018-09-07 DIAGNOSIS — F419 Anxiety disorder, unspecified: Secondary | ICD-10-CM | POA: Diagnosis not present

## 2018-09-07 DIAGNOSIS — F329 Major depressive disorder, single episode, unspecified: Secondary | ICD-10-CM | POA: Diagnosis not present

## 2018-09-07 DIAGNOSIS — E119 Type 2 diabetes mellitus without complications: Secondary | ICD-10-CM | POA: Diagnosis not present

## 2018-09-07 NOTE — Telephone Encounter (Signed)
Referrals are ordered, routed to our referral coordinator.  Jenny Reichmann, if any orders need change, let me know!

## 2018-09-07 NOTE — Telephone Encounter (Signed)
[facility] [employer] called and states the order needs to match the office note. They are faxing over a new order.

## 2018-09-07 NOTE — Addendum Note (Signed)
Addended by: Maryla Morrow on: 09/07/2018 12:52 PM   Modules accepted: Orders

## 2018-09-10 NOTE — Telephone Encounter (Signed)
Order completed

## 2018-09-11 ENCOUNTER — Telehealth: Payer: Self-pay

## 2018-09-11 DIAGNOSIS — S14124S Central cord syndrome at C4 level of cervical spinal cord, sequela: Secondary | ICD-10-CM

## 2018-09-11 NOTE — Telephone Encounter (Signed)
I think I routed this to Jenny Reichmann a few days ago.  It has to go to some specific work you, I am not exactly sure how to order this.  Jenny Reichmann, please help!

## 2018-09-11 NOTE — Telephone Encounter (Signed)
[name] called and left a message stating the PT next door advised her she will need to be referred to the Neuro Clinic in [place] on [address] for PT and OT. Please advise.

## 2018-09-12 ENCOUNTER — Telehealth: Payer: Self-pay

## 2018-09-12 DIAGNOSIS — F329 Major depressive disorder, single episode, unspecified: Secondary | ICD-10-CM | POA: Diagnosis not present

## 2018-09-12 DIAGNOSIS — E119 Type 2 diabetes mellitus without complications: Secondary | ICD-10-CM | POA: Diagnosis not present

## 2018-09-12 DIAGNOSIS — F419 Anxiety disorder, unspecified: Secondary | ICD-10-CM | POA: Diagnosis not present

## 2018-09-12 DIAGNOSIS — S14115D Complete lesion at C5 level of cervical spinal cord, subsequent encounter: Secondary | ICD-10-CM | POA: Diagnosis not present

## 2018-09-12 DIAGNOSIS — E11319 Type 2 diabetes mellitus with unspecified diabetic retinopathy without macular edema: Secondary | ICD-10-CM | POA: Diagnosis not present

## 2018-09-12 DIAGNOSIS — D649 Anemia, unspecified: Secondary | ICD-10-CM | POA: Diagnosis not present

## 2018-09-12 DIAGNOSIS — G8253 Quadriplegia, C5-C7 complete: Secondary | ICD-10-CM | POA: Diagnosis not present

## 2018-09-12 DIAGNOSIS — Z9689 Presence of other specified functional implants: Secondary | ICD-10-CM | POA: Diagnosis not present

## 2018-09-12 DIAGNOSIS — E039 Hypothyroidism, unspecified: Secondary | ICD-10-CM | POA: Diagnosis not present

## 2018-09-12 NOTE — Telephone Encounter (Signed)
Okay, I think I put in the right order.  They kept messaging me saying I needed to change the work you, I am not sure how that is done, thanks for the clarification!  Let me know if there is anything else you need for me!

## 2018-09-12 NOTE — Telephone Encounter (Signed)
Referral is sent to [facility]

## 2018-09-12 NOTE — Telephone Encounter (Signed)
Dr. Esther Hardy put a referral in for [facility]rology which is not the correct place if you will just put a new referral in for Rehab I will get it to where It needs to be sent to.

## 2018-09-12 NOTE — Telephone Encounter (Signed)
Referrals sent - [facility]

## 2018-09-12 NOTE — Telephone Encounter (Signed)
Pt left a vm msg stating that she can not pay $350 for eliquis rx. She contacted [facility] and was informed that preferred med is plavix. Pt would like to switch to the preferred formulary. Pls send a 90 day supply to [place]. Thanks.

## 2018-09-13 DIAGNOSIS — E039 Hypothyroidism, unspecified: Secondary | ICD-10-CM | POA: Diagnosis not present

## 2018-09-13 DIAGNOSIS — D649 Anemia, unspecified: Secondary | ICD-10-CM | POA: Diagnosis not present

## 2018-09-13 DIAGNOSIS — S14115D Complete lesion at C5 level of cervical spinal cord, subsequent encounter: Secondary | ICD-10-CM | POA: Diagnosis not present

## 2018-09-13 DIAGNOSIS — F329 Major depressive disorder, single episode, unspecified: Secondary | ICD-10-CM | POA: Diagnosis not present

## 2018-09-13 DIAGNOSIS — E11319 Type 2 diabetes mellitus with unspecified diabetic retinopathy without macular edema: Secondary | ICD-10-CM | POA: Diagnosis not present

## 2018-09-13 DIAGNOSIS — F419 Anxiety disorder, unspecified: Secondary | ICD-10-CM | POA: Diagnosis not present

## 2018-09-13 DIAGNOSIS — G8253 Quadriplegia, C5-C7 complete: Secondary | ICD-10-CM | POA: Diagnosis not present

## 2018-09-13 DIAGNOSIS — E119 Type 2 diabetes mellitus without complications: Secondary | ICD-10-CM | POA: Diagnosis not present

## 2018-09-13 DIAGNOSIS — Z9689 Presence of other specified functional implants: Secondary | ICD-10-CM | POA: Diagnosis not present

## 2018-09-13 NOTE — Telephone Encounter (Signed)
Appointment has been made. No further question at this time.

## 2018-09-13 NOTE — Telephone Encounter (Signed)
Plavix will not treat a DVT, please have her schedule virtual visit to address this issue

## 2018-09-13 NOTE — Telephone Encounter (Signed)
Please call and schedule pt per Dr Luretha Rued note

## 2018-09-14 ENCOUNTER — Ambulatory Visit (INDEPENDENT_AMBULATORY_CARE_PROVIDER_SITE_OTHER): Payer: Medicare HMO | Admitting: Osteopathic Medicine

## 2018-09-14 ENCOUNTER — Encounter: Payer: Self-pay | Admitting: Osteopathic Medicine

## 2018-09-14 DIAGNOSIS — S14124S Central cord syndrome at C4 level of cervical spinal cord, sequela: Secondary | ICD-10-CM

## 2018-09-14 DIAGNOSIS — E11319 Type 2 diabetes mellitus with unspecified diabetic retinopathy without macular edema: Secondary | ICD-10-CM | POA: Diagnosis not present

## 2018-09-14 DIAGNOSIS — S14115D Complete lesion at C5 level of cervical spinal cord, subsequent encounter: Secondary | ICD-10-CM | POA: Diagnosis not present

## 2018-09-14 DIAGNOSIS — G8253 Quadriplegia, C5-C7 complete: Secondary | ICD-10-CM | POA: Diagnosis not present

## 2018-09-14 DIAGNOSIS — E039 Hypothyroidism, unspecified: Secondary | ICD-10-CM | POA: Diagnosis not present

## 2018-09-14 DIAGNOSIS — F329 Major depressive disorder, single episode, unspecified: Secondary | ICD-10-CM | POA: Diagnosis not present

## 2018-09-14 DIAGNOSIS — D649 Anemia, unspecified: Secondary | ICD-10-CM | POA: Diagnosis not present

## 2018-09-14 DIAGNOSIS — Z9689 Presence of other specified functional implants: Secondary | ICD-10-CM | POA: Diagnosis not present

## 2018-09-14 DIAGNOSIS — E119 Type 2 diabetes mellitus without complications: Secondary | ICD-10-CM | POA: Diagnosis not present

## 2018-09-14 DIAGNOSIS — F419 Anxiety disorder, unspecified: Secondary | ICD-10-CM | POA: Diagnosis not present

## 2018-09-14 MED ORDER — OXYCODONE HCL 5 MG PO TABS: 5.0000 mg | ORAL_TABLET | Freq: Four times a day (QID) | ORAL | 0 refills | Status: DC | PRN

## 2018-09-14 NOTE — Progress Notes (Signed)
Virtual Visit via Video (App used: Doximity) Note  I connected with      Veronica [name] on 09/14/18 at 10:51 AM  by a telemedicine application and verified that I am speaking with the correct person using two identifiers.  Patient is at home I am in office   I discussed the limitations of evaluation and management by telemedicine and the availability of in person appointments. The patient expressed understanding and agreed to proceed.  History of Present Illness: Veronica [name] is a 78 y.o. female who would like to discuss blood thinners    Concern for cost of Eliquis. Known DVT. US 07/06/18 in chart, no previous results available, she was initially diagnosed at rehab facility, we never did get the records from this..  Apparently the pharmacist suggested that she might try Plavix instead...  Requests referral to neurosurgeon locally.  Requests referral to neuro PT/OT, I have received several messages about this and apparently the order was not entered correctly into epic, I have since fixed that situation, hopefully  Patient had some questions about vitamin B12 shots, reportedly her neurologist recommended these for her condition.   Has backed off a bit on pain meds but needs a medication refill for these.  Has questions about whether a massage therapist can touch her leg.    Observations/Objective: There were no vitals taken for this visit. BP Readings from Last 3 Encounters:  07/11/18 112/64  07/03/18 110/70  06/18/18 (!) 75/42   Exam: Normal Speech.  NAD  Lab and Radiology Results No results found for this or any previous visit (from the past 72 hour(s)). No results found.     Assessment and Plan: 78 y.o. female with The encounter diagnosis was Central cord syndrome at C4 level of cervical spinal cord, sequela (HCC).   PDMP not reviewed this encounter. Orders Placed This Encounter  Procedures  . Ambulatory Referral to Neuro Rehab    Referral Priority:    Routine    Referral Type:   Consultation    Number of Visits Requested:   1  . Ambulatory referral to Neurosurgery    Referral Priority:   Routine    Referral Type:   Surgical    Referral Reason:   Specialty Services Required    Requested Specialty:   Neurosurgery    Number of Visits Requested:   1   Meds ordered this encounter  Medications  . oxyCODONE (OXY IR/ROXICODONE) 5 MG immediate release tablet    Sig: Take 1-2 tablets (5-10 mg total) by mouth every 6 (six) hours as needed for severe pain or breakthrough pain.    Dispense:  120 tablet    Refill:  0   There are no Patient Instructions on file for this visit.  Instructions sent via MyChart. If MyChart not available, pt was given option for info via personal e-mail w/ no guarantee of protected health info over unsecured e-mail communication, and MyChart sign-up instructions were included.   Follow Up Instructions: Return in about 3 months (around 12/15/2018) for Maintain opiate pain medication prescription..    I discussed the assessment and treatment plan with the patient. The patient was provided an opportunity to ask questions and all were answered. The patient agreed with the plan and demonstrated an understanding of the instructions.   The patient was advised to call back or seek an in-person evaluation if any new concerns, if symptoms worsen or if the condition fails to improve as anticipated.  30 minutes of non-face-to-face  time was provided during this encounter.                      Historical information moved to improve visibility of documentation.  Past Medical History:  Diagnosis Date  . Anemia   . Diabetic retinopathy (HCC) 03/23/2015  . GERD (gastroesophageal reflux disease) 01/05/2015  . Hypothyroid   . Osteopenia 03/03/2014  . Type 2 diabetes mellitus with microalbuminuria or microproteinuria 03/03/2014   Actos added January 2016.     Past Surgical History:  Procedure Laterality Date   . APPENDECTOMY  02/28/11  . CESAREAN SECTION    . LAPAROSCOPIC APPENDECTOMY  03/03/2011   Procedure: APPENDECTOMY LAPAROSCOPIC;  Surgeon: Valarie MerinoMatthew B Martin, MD;  Location: [facility];  Service: General;  Laterality: N/A;  . TONSILLECTOMY     Social History   Tobacco Use  . Smoking status: Never Smoker  . Smokeless tobacco: Never Used  Substance Use Topics  . Alcohol use: No   family history includes Cancer in her father and mother; Diabetes in her mother; Hypertension in her mother; Stroke in her mother.  Medications: Current Outpatient Medications  Medication Sig Dispense Refill  . Accu-Chek Softclix Lancets lancets Use as instructed up to qid 200 each 99  . acetaminophen (TYLENOL) 500 MG tablet Take by mouth.    . Alcohol Swabs PADS Per insurance coverage, use as directed w/ glucose testing 120 each 99  . AMBULATORY NON FORMULARY MEDICATION Medication Name: purwick external urinary catheter. Sig: apply externally as directed PRN urination 100 Units 99  . baclofen (LIORESAL) 10 MG tablet TAKE 1/2 TABLET (5 MG) BY MOUTH 3 (THREE) TIMES DAILY AS NEEDED. 90 tablet 0  . bisacodyl (DULCOLAX) 10 MG suppository Place rectally.    . carvedilol (COREG) 6.25 MG tablet Take 1 tablet (6.25 mg total) by mouth 2 (two) times daily with a meal. 180 tablet 1  . ciprofloxacin (CIPRO) 500 MG tablet Take 1 tablet (500 mg total) by mouth 2 (two) times daily. 20 tablet 0  . doxepin (SINEQUAN) 10 MG capsule Take 1 capsule (10 mg total) by mouth at bedtime. 90 capsule 1  . DULoxetine (CYMBALTA) 60 MG capsule Take 1 capsule (60 mg total) by mouth daily. 90 capsule 1  . ELIQUIS 5 MG TABS tablet Take 1 tablet (5 mg total) by mouth 2 (two) times daily. 180 tablet 1  . gabapentin (NEURONTIN) 300 MG capsule Take 2-3 capsules (600-900 mg total) by mouth 3 (three) times daily. 540 capsule 1  . glucose blood test strip Use as instructed up to qid 200 each 99  . insulin aspart (FIASP FLEXTOUCH) 100 UNIT/ML FlexPen Inject  into the skin.    [name]. insulin degludec (TRESIBA) 100 UNIT/ML SOPN FlexTouch Pen Inject into the skin.    [name]. insulin NPH Human (HUMULIN N,NOVOLIN N) 100 UNIT/ML injection Inject into the skin.    [name]. insulin regular (NOVOLIN R,HUMULIN R) 100 units/mL injection Inject into the skin.    [name]. levothyroxine (SYNTHROID, LEVOTHROID) 88 MCG tablet Take 1 tablet (88 mcg total) by mouth daily. FOLLOWING WITH ENDOCRINOLOGY DR LEVY 1 tablet 0  . metFORMIN (GLUCOPHAGE-XR) 500 MG 24 hr tablet Take by mouth. FOLLOWING W/ ENDOCRINOLOGY DR. Shawnee KnappLEVY    . oxyCODONE (OXY IR/ROXICODONE) 5 MG immediate release tablet Take 1-2 tablets (5-10 mg total) by mouth every 6 (six) hours as needed for severe pain or breakthrough pain. 120 tablet 0  . ranitidine (ZANTAC) 300 MG capsule Take 1 capsule (300 mg  total) by mouth every evening. 90 capsule 3  . clonazePAM (KLONOPIN) 0.5 MG tablet Take 1 tablet (0.5 mg total) by mouth 3 (three) times daily as needed for up to 30 days (#90 for thirty days). for anxiety 90 tablet 2   No current facility-administered medications for this visit.    Allergies  Allergen Reactions  . Dulaglutide Palpitations    SOB. N/V  . Cephalexin Other (See Comments)    hives   . Codeine Hives and Nausea And Vomiting  . Glimepiride Hives  . Penicillins Rash  . Sitagliptin Other (See Comments)    Insomnia   . Sulfamethoxazole-Trimethoprim Hives  . Ezetimibe   . Guaifenesin   . Metformin And Related     diarrhea  . Other Other (See Comments)    All oral diabetic drugs: nausea, vomiting, dizziness Unknown antibiotic: "starts with a P" - rash  . Penicillin G Itching  . Pseudoephedrine Hcl   . Sulfa Antibiotics Hives and Nausea And Vomiting  . Sulfamethoxazole Itching    PDMP not reviewed this encounter. Orders Placed This Encounter  Procedures  . Ambulatory Referral to Neuro Rehab    Referral Priority:   Routine    Referral Type:   Consultation    Number of Visits Requested:   1  . Ambulatory  referral to Neurosurgery    Referral Priority:   Routine    Referral Type:   Surgical    Referral Reason:   Specialty Services Required    Requested Specialty:   Neurosurgery    Number of Visits Requested:   1   Meds ordered this encounter  Medications  . oxyCODONE (OXY IR/ROXICODONE) 5 MG immediate release tablet    Sig: Take 1-2 tablets (5-10 mg total) by mouth every 6 (six) hours as needed for severe pain or breakthrough pain.    Dispense:  120 tablet    Refill:  0

## 2018-09-17 DIAGNOSIS — D649 Anemia, unspecified: Secondary | ICD-10-CM | POA: Diagnosis not present

## 2018-09-17 DIAGNOSIS — E039 Hypothyroidism, unspecified: Secondary | ICD-10-CM | POA: Diagnosis not present

## 2018-09-17 DIAGNOSIS — E119 Type 2 diabetes mellitus without complications: Secondary | ICD-10-CM | POA: Diagnosis not present

## 2018-09-17 DIAGNOSIS — F419 Anxiety disorder, unspecified: Secondary | ICD-10-CM | POA: Diagnosis not present

## 2018-09-17 DIAGNOSIS — S14115D Complete lesion at C5 level of cervical spinal cord, subsequent encounter: Secondary | ICD-10-CM | POA: Diagnosis not present

## 2018-09-17 DIAGNOSIS — E11319 Type 2 diabetes mellitus with unspecified diabetic retinopathy without macular edema: Secondary | ICD-10-CM | POA: Diagnosis not present

## 2018-09-17 DIAGNOSIS — F329 Major depressive disorder, single episode, unspecified: Secondary | ICD-10-CM | POA: Diagnosis not present

## 2018-09-17 DIAGNOSIS — Z9689 Presence of other specified functional implants: Secondary | ICD-10-CM | POA: Diagnosis not present

## 2018-09-17 DIAGNOSIS — G8253 Quadriplegia, C5-C7 complete: Secondary | ICD-10-CM | POA: Diagnosis not present

## 2018-09-19 DIAGNOSIS — E11319 Type 2 diabetes mellitus with unspecified diabetic retinopathy without macular edema: Secondary | ICD-10-CM | POA: Diagnosis not present

## 2018-09-19 DIAGNOSIS — F419 Anxiety disorder, unspecified: Secondary | ICD-10-CM | POA: Diagnosis not present

## 2018-09-19 DIAGNOSIS — D649 Anemia, unspecified: Secondary | ICD-10-CM | POA: Diagnosis not present

## 2018-09-19 DIAGNOSIS — Z9689 Presence of other specified functional implants: Secondary | ICD-10-CM | POA: Diagnosis not present

## 2018-09-19 DIAGNOSIS — G8253 Quadriplegia, C5-C7 complete: Secondary | ICD-10-CM | POA: Diagnosis not present

## 2018-09-19 DIAGNOSIS — E119 Type 2 diabetes mellitus without complications: Secondary | ICD-10-CM | POA: Diagnosis not present

## 2018-09-19 DIAGNOSIS — F329 Major depressive disorder, single episode, unspecified: Secondary | ICD-10-CM | POA: Diagnosis not present

## 2018-09-19 DIAGNOSIS — E039 Hypothyroidism, unspecified: Secondary | ICD-10-CM | POA: Diagnosis not present

## 2018-09-19 DIAGNOSIS — S14115D Complete lesion at C5 level of cervical spinal cord, subsequent encounter: Secondary | ICD-10-CM | POA: Diagnosis not present

## 2018-09-20 ENCOUNTER — Telehealth: Payer: Self-pay | Admitting: Osteopathic Medicine

## 2018-09-20 NOTE — Telephone Encounter (Signed)
Dr. Sheppard Coil   We have already sent a referral for Gladyse Corvin to [facility] is there a reason why we are doing another one?

## 2018-09-20 NOTE — Telephone Encounter (Signed)
I'd call patient, and not have realized a referral was already in place, I think she requested local, she had apparently been seeing someone either [place] or [place].

## 2018-09-21 ENCOUNTER — Telehealth: Payer: Self-pay | Admitting: Osteopathic Medicine

## 2018-09-21 DIAGNOSIS — E1165 Type 2 diabetes mellitus with hyperglycemia: Secondary | ICD-10-CM | POA: Diagnosis not present

## 2018-09-21 NOTE — Telephone Encounter (Signed)
-----   Message from Emeterio Reeve, DO sent at 09/14/2018 12:40 PM EDT ----- Patient was at [facility] in [place] earlier this year when she was initially diagnosed with DVT, I do not think we ever received ultrasound report from them, can we call this facility and see if they can send it?  Thank you!

## 2018-09-21 NOTE — Telephone Encounter (Signed)
Called [facility] [place] [facility] [place] [facility] [place] to return our call and could fax ultrasound report to [phone]. KG LPN

## 2018-09-24 DIAGNOSIS — G8253 Quadriplegia, C5-C7 complete: Secondary | ICD-10-CM | POA: Diagnosis not present

## 2018-09-24 DIAGNOSIS — Z9689 Presence of other specified functional implants: Secondary | ICD-10-CM | POA: Diagnosis not present

## 2018-09-24 DIAGNOSIS — F329 Major depressive disorder, single episode, unspecified: Secondary | ICD-10-CM | POA: Diagnosis not present

## 2018-09-24 DIAGNOSIS — D649 Anemia, unspecified: Secondary | ICD-10-CM | POA: Diagnosis not present

## 2018-09-24 DIAGNOSIS — F419 Anxiety disorder, unspecified: Secondary | ICD-10-CM | POA: Diagnosis not present

## 2018-09-24 DIAGNOSIS — E039 Hypothyroidism, unspecified: Secondary | ICD-10-CM | POA: Diagnosis not present

## 2018-09-24 DIAGNOSIS — E11319 Type 2 diabetes mellitus with unspecified diabetic retinopathy without macular edema: Secondary | ICD-10-CM | POA: Diagnosis not present

## 2018-09-24 DIAGNOSIS — E119 Type 2 diabetes mellitus without complications: Secondary | ICD-10-CM | POA: Diagnosis not present

## 2018-09-24 DIAGNOSIS — S14115D Complete lesion at C5 level of cervical spinal cord, subsequent encounter: Secondary | ICD-10-CM | POA: Diagnosis not present

## 2018-09-27 ENCOUNTER — Telehealth (INDEPENDENT_AMBULATORY_CARE_PROVIDER_SITE_OTHER): Payer: Medicare HMO

## 2018-09-27 DIAGNOSIS — D649 Anemia, unspecified: Secondary | ICD-10-CM | POA: Diagnosis not present

## 2018-09-27 DIAGNOSIS — F419 Anxiety disorder, unspecified: Secondary | ICD-10-CM | POA: Diagnosis not present

## 2018-09-27 DIAGNOSIS — G8253 Quadriplegia, C5-C7 complete: Secondary | ICD-10-CM | POA: Diagnosis not present

## 2018-09-27 DIAGNOSIS — Z9689 Presence of other specified functional implants: Secondary | ICD-10-CM | POA: Diagnosis not present

## 2018-09-27 DIAGNOSIS — I959 Hypotension, unspecified: Secondary | ICD-10-CM | POA: Diagnosis not present

## 2018-09-27 DIAGNOSIS — S14115D Complete lesion at C5 level of cervical spinal cord, subsequent encounter: Secondary | ICD-10-CM | POA: Diagnosis not present

## 2018-09-27 DIAGNOSIS — F329 Major depressive disorder, single episode, unspecified: Secondary | ICD-10-CM | POA: Diagnosis not present

## 2018-09-27 DIAGNOSIS — E11319 Type 2 diabetes mellitus with unspecified diabetic retinopathy without macular edema: Secondary | ICD-10-CM | POA: Diagnosis not present

## 2018-09-27 DIAGNOSIS — E039 Hypothyroidism, unspecified: Secondary | ICD-10-CM | POA: Diagnosis not present

## 2018-09-27 DIAGNOSIS — E119 Type 2 diabetes mellitus without complications: Secondary | ICD-10-CM | POA: Diagnosis not present

## 2018-09-27 NOTE — Telephone Encounter (Signed)
Pt called with two inquiries: wants to know if she should continue to take carvedilol rx? She states that bp readings range between 90's/60's. Second inquiry was regarding Eliquis rx. Has provider come up with another recommendation. Pls advise, thanks.

## 2018-09-28 NOTE — Telephone Encounter (Signed)
Unless she wants to get on Coumadin, I do not really have a good alternative for the Eliquis though they could call their insurance company and see if Xarelto is covered.  We also have the option to repeat the ultrasound to see if the DVT has resolved.  If she would like to do this, let me know and I can put in an order.  If blood pressure is 90s over 60s, would stop the carvedilol.  BP Readings from Last 3 Encounters:  07/11/18 112/64  07/03/18 110/70  06/18/18 (!) 75/42   5 mins spent

## 2018-09-28 NOTE — Telephone Encounter (Signed)
Pt has been updated of provider's note. Agrees with recommendations. As per pt, she will return call back when she is ready to initiate U/S referral. Unable to complete at this time because she will be having PT in [place] and would like to better coordinate her upcoming appts. No other inquiries during call.

## 2018-09-28 NOTE — Telephone Encounter (Signed)
Sorry didn't route last note, see below

## 2018-09-30 DIAGNOSIS — G8250 Quadriplegia, unspecified: Secondary | ICD-10-CM | POA: Diagnosis not present

## 2018-10-01 ENCOUNTER — Ambulatory Visit: Payer: Medicare HMO | Admitting: Occupational Therapy

## 2018-10-01 ENCOUNTER — Other Ambulatory Visit: Payer: Self-pay

## 2018-10-01 ENCOUNTER — Ambulatory Visit: Payer: Medicare HMO | Attending: Osteopathic Medicine | Admitting: Physical Therapy

## 2018-10-01 DIAGNOSIS — R208 Other disturbances of skin sensation: Secondary | ICD-10-CM | POA: Insufficient documentation

## 2018-10-01 DIAGNOSIS — M25511 Pain in right shoulder: Secondary | ICD-10-CM | POA: Diagnosis present

## 2018-10-01 DIAGNOSIS — G8929 Other chronic pain: Secondary | ICD-10-CM | POA: Insufficient documentation

## 2018-10-01 DIAGNOSIS — M25641 Stiffness of right hand, not elsewhere classified: Secondary | ICD-10-CM | POA: Diagnosis not present

## 2018-10-01 DIAGNOSIS — M25642 Stiffness of left hand, not elsewhere classified: Secondary | ICD-10-CM

## 2018-10-01 DIAGNOSIS — G8250 Quadriplegia, unspecified: Secondary | ICD-10-CM | POA: Insufficient documentation

## 2018-10-01 DIAGNOSIS — M25611 Stiffness of right shoulder, not elsewhere classified: Secondary | ICD-10-CM | POA: Diagnosis not present

## 2018-10-01 DIAGNOSIS — R293 Abnormal posture: Secondary | ICD-10-CM

## 2018-10-01 DIAGNOSIS — M25612 Stiffness of left shoulder, not elsewhere classified: Secondary | ICD-10-CM

## 2018-10-01 DIAGNOSIS — R2681 Unsteadiness on feet: Secondary | ICD-10-CM

## 2018-10-01 DIAGNOSIS — R29818 Other symptoms and signs involving the nervous system: Secondary | ICD-10-CM | POA: Diagnosis not present

## 2018-10-01 DIAGNOSIS — M25512 Pain in left shoulder: Secondary | ICD-10-CM | POA: Insufficient documentation

## 2018-10-01 DIAGNOSIS — IMO0002 Reserved for concepts with insufficient information to code with codable children: Secondary | ICD-10-CM

## 2018-10-01 DIAGNOSIS — M6281 Muscle weakness (generalized): Secondary | ICD-10-CM | POA: Insufficient documentation

## 2018-10-01 DIAGNOSIS — R262 Difficulty in walking, not elsewhere classified: Secondary | ICD-10-CM | POA: Insufficient documentation

## 2018-10-01 NOTE — Therapy (Signed)
[facility] [address] [place], [place], [address] Phone: [phone]   Fax:  [phone]  Occupational Therapy Evaluation  Patient Details  Name: Veronica [name] MRN: [id number] Date of Birth: 06/03/1940 Referring Provider (OT): Sunnie NielsenNatalie Alexander   Encounter Date: 10/01/2018  OT End of Session - 10/01/18 1509    Visit Number  1    Number of Visits  17    Date for OT Re-Evaluation  12/01/18    Authorization Type  [employer] MCR - requires prior approval    OT Start Time  1230    OT Stop Time  1315    OT Time Calculation (min)  45 min    Activity Tolerance  Patient tolerated treatment well    Behavior During Therapy  [facility] for tasks assessed/performed       Past Medical History:  Diagnosis Date  . Anemia   . Diabetic retinopathy (HCC) 03/23/2015  . GERD (gastroesophageal reflux disease) 01/05/2015  . Hypothyroid   . Osteopenia 03/03/2014  . Type 2 diabetes mellitus with microalbuminuria or microproteinuria 03/03/2014   Actos added January 2016.      Past Surgical History:  Procedure Laterality Date  . APPENDECTOMY  02/28/11  . CESAREAN SECTION    . LAPAROSCOPIC APPENDECTOMY  03/03/2011   Procedure: APPENDECTOMY LAPAROSCOPIC;  Surgeon: Valarie MerinoMatthew B Martin, MD;  Location: [facility];  Service: General;  Laterality: N/A;  . TONSILLECTOMY      There were no vitals filed for this visit.  Subjective Assessment - 10/01/18 1234    Pertinent History  C4 central cord syndrome w/ posterior cervical fusion Jan 2020. PMH: DM, GERD    Currently in Pain?  Yes    Pain Score  4     Pain Location  Shoulder    Pain Orientation  Right;Left   Rt side worse   Pain Descriptors / Indicators  Shooting;Aching   shooting pain down Rt arm at night   Pain Onset  More than a month ago    Pain Frequency  Constant   fluctuates in intensity   Aggravating Factors   using walker, at night    Pain Relieving Factors  meds, repositioning         [facility] - 10/01/18 0001      Assessment   Medical Diagnosis  central cord syndrome at C4 level, s/p posterior cervical fusion    Referring Provider (OT)  Sunnie NielsenNatalie Alexander    Onset Date/Surgical Date  02/22/18    Hand Dominance  Right    Prior Therapy  acute, inpatient rehab, home health therapies      Precautions   Precaution Comments  no driving, dexcom G6       Balance Screen   Has the patient fallen in the past 6 months  No      Home  Environment   Bathroom Shower/Tub  --   Wheel in shower    Additional Comments  Pt lives w/ husband in 2 story home, but stays on 1st floor. Wheel in shower w/ grab bars. Caregiver M-F 8 hrs/day, Sat up to 6 hrs    Lives With  Spouse      Prior Function   Level of Independence  Independent    Leisure  skydiving, traveling, nascar      ADL   Eating/Feeding  + 1  Total assistance   Pt can do some w/ Lt hand w/ wrist univeral cuff   Grooming  --  dependent   Upper Body Bathing  + 1 Total asssestance   dependent   Lower Body Bathing  Maximal assistance    Upper Body Dressing  + 1 Total assistance    Lower Body Dressing  +1 Total aassistance    Toilet Transfer  Min guard    Toileting - Clothing Manipulation  + 1 Total assistance    Toileting -  Hygiene  + 1 Total assistance      IADL   Shopping  Completely unable to shop    Meal Prep  Needs to have meals prepared and served    Publishing rights managerCommunity Mobility  Relies on family or friends for transportation    Medication Management  --   Needs physical assistance, aide does pillbox     Mobility   Mobility Status  Needs assist    Mobility Status Comments  uses w/c, platform walker w/ supervision for short distances      Written Expression   Dominant Hand  Right    Handwriting  --   unable     Vision - History   Baseline Vision  Wears glasses only for reading    Visual History  Retinopathy   corrected     Cognition   Overall Cognitive Status  Within Functional Limits for  tasks assessed      Sensation   Light Touch  Impaired by gross assessment    Additional Comments  Pt w/ dimished sensation Lt hand, could not detect radial side of Rt hand at all w/ 2 pt discrimination      Coordination   9 Hole Peg Test  --   unable   Coordination  Pt with very limited use bilateral hands w/ a lateral pinch as function. Pt limited bilaterally due to tone and ROM deficits - pt has more extension Rt hand, but slightly more function and thumb movement Lt hand      Tone   Assessment Location  Right Upper Extremity;Left Upper Extremity      ROM / Strength   AROM / PROM / Strength  AROM      AROM   Overall AROM Comments  RUE sh flexion to approx 60* w/ forearm neutral, elbow flex WFL's w/ elbow extension limited end range d/t tone, wrist ext to -20*. Rt hand extension approx 90%, composite flex approx 75%, and minimal thumb movement. LUE sh flex to 65* but w/ forearm pronated, elbow flex WFL's, elbow extension to -20* w/ forearm pronated. WRist ext to neutral, finger flex 90%, finger ext only 75%, slightly more thumb movement on Lt but can only oppose to 3rd digit      Hand Function   Comment  see above coordination section      RUE Tone   RUE Tone  Hypertonic      LUE Tone   LUE Tone  Hypertonic                        OT Short Term Goals - 10/01/18 1522      OT SHORT TERM GOAL #1   Title  Independent with initial HEP - 10/31/08    Time  4    Period  Weeks    Status  New      OT SHORT TERM GOAL #2   Title  Independent with splint wear and care    Time  4    Period  Weeks    Status  New  OT SHORT TERM GOAL #3   Title  Pt to feed self w/ AE prn 50% of the time    Time  4    Period  Weeks    Status  New      OT SHORT TERM GOAL #4   Title  Pt to perform grooming with AE 50% of the time using both hands prn    Time  4    Period  Weeks    Status  New      OT SHORT TERM GOAL #5   Title  Pt to perform 25% or more of bathing with mitt     Time  4    Period  Weeks    Status  New        OT Long Term Goals - 10/01/18 1525      OT LONG TERM GOAL #1   Title  Pt to verbalize understanding with tone management strategies including stretches, possible aquatic therapy, and medically managed options - 12/01/18    Time  8    Period  Weeks    Status  New      OT LONG TERM GOAL #2   Title  Pt to improve bilateral UE function to assist with 25-50% of UE dressing and 75% of UE bathing    Time  8    Period  Weeks    Status  New      OT LONG TERM GOAL #3   Title  Pt to feed self 75% of the time    Time  8    Period  Weeks    Status  New      OT LONG TERM GOAL #4   Title  Pt to perform toileting including hygiene/perineal care and clothes management w/ mod assist    Baseline  total assist    Time  8    Period  Weeks    Status  New            Plan - 10/01/18 1515    Clinical Impression Statement  Pt is a 78 y.o. female who presents to outpatient therapy for O.T. evaluation s/p central cord syndrome at C4 level on 02/21/18 after falling down 14 steps, and posterior cervical fusion on 02/22/18. Pt now with limited BUE function and ROM and relies on caregivers to perform all ADLS including feeding. Pt also limited in mobility. Pt would benefit from O.T. to address UE function, increase independence with ADLS, and maintain ROM UE's to prevent contractures.    OT Occupational Profile and History  Detailed Assessment- Review of Records and additional review of physical, cognitive, psychosocial history related to current functional performance    Occupational performance deficits (Please refer to evaluation for details):  ADL's;IADL's;Leisure;Social Participation    Body Structure / Function / Physical Skills  ADL;ROM;IADL;Body mechanics;Sensation;Mobility;Improper spinal/pelvic alignment;Flexibility;Strength;Coordination;Tone;UE functional use;Pain;Decreased knowledge of precautions;Decreased knowledge of use of DME    Rehab  Potential  Fair   time since onset, severity of UE deficits   Clinical Decision Making  Several treatment options, min-mod task modification necessary    Modification or Assistance to Complete Evaluation   Min-Moderate modification of tasks or assist with assess necessary to complete eval    OT Frequency  2x / week    OT Duration  8 weeks   plus eval   OT Treatment/Interventions  Self-care/ADL training;Therapeutic exercise;Functional Mobility Training;Neuromuscular education;Manual Therapy;Splinting;Coping strategies training;Therapeutic activities;DME and/or AE instruction;Electrical Stimulation;Fluidtherapy;Aquatic Therapy;Passive range of motion;Patient/family education  Plan  initiate HEP, prefab wrist splints for brushing teeth and hair, show A/E for LH brush, discuss potential aquatic therapy    Consulted and Agree with Plan of Care  Patient;Other (Comment)   Caregiver - Maddie      Patient will benefit from skilled therapeutic intervention in order to improve the following deficits and impairments:   Body Structure / Function / Physical Skills: ADL, ROM, IADL, Body mechanics, Sensation, Mobility, Improper spinal/pelvic alignment, Flexibility, Strength, Coordination, Tone, UE functional use, Pain, Decreased knowledge of precautions, Decreased knowledge of use of DME       Visit Diagnosis: 1. Quadriplegia following spinal cord injury ([place])   2. Stiffness of right shoulder, not elsewhere classified   3. Stiffness of left shoulder, not elsewhere classified   4. Stiffness of right hand, not elsewhere classified   5. Stiffness of left hand, not elsewhere classified   6. Other disturbances of skin sensation   7. Abnormal posture   8. Chronic right shoulder pain   9. Chronic left shoulder pain   10. Unsteadiness on feet       Problem List Patient Active Problem List   Diagnosis Date Noted  . Acute deep vein thrombosis (DVT) of left lower extremity ([place]) 07/12/2018  . History  of sepsis 06/18/2018  . Hypotension 06/18/2018  . Quadriplegia, C1-C4 incomplete ([place]) 04/30/2018  . Central cord syndrome at C4 level of cervical spinal cord ([place]) 02/28/2018  . Impaired mobility and ADLs 02/28/2018  . Elevated blood pressure reading 05/15/2017  . De Quervain's tenosynovitis, right 01/20/2016  . Lumbago 01/18/2016  . Hypercalcemia 12/28/2015  . Ganglion cyst of wrist 10/27/2015  . Sciatica of right side 10/27/2015  . Right knee pain 10/27/2015  . Diabetic retinopathy ([place]) 03/23/2015  . GERD (gastroesophageal reflux disease) 01/05/2015  . Osteopenia 03/03/2014  . Type 2 diabetes mellitus with microalbuminuria or microproteinuria 03/03/2014  . Hypothyroid 02/28/2014  . Anxiety and depression 02/28/2014    Carey Bullocks, OTR/L 10/01/2018, 3:28 PM  [facility] [address] [place], [place], [address] Phone: [phone]   Fax:  [phone]  Name: Ladeja Pelham MRN: [id number] Date of Birth: 1940/06/19

## 2018-10-02 ENCOUNTER — Encounter: Payer: Self-pay | Admitting: Physical Therapy

## 2018-10-02 NOTE — Therapy (Signed)
[facility] [address] [place], [place], [address] Phone: [phone]   Fax:  [phone]  Physical Therapy Evaluation  Patient Details  Name: Veronica [name] MRN: [id number] Date of Birth: 01/17/1941 Referring Provider (PT): Sunnie NielsenNatalie Alexander, DO   Encounter Date: 10/01/2018  PT End of Session - 10/02/18 1152    Visit Number  1    Number of Visits  25    Date for PT Re-Evaluation  12/31/18    Authorization Type  [employer]    PT Start Time  1316    PT Stop Time  1401    PT Time Calculation (min)  45 min    Equipment Utilized During Treatment  Gait belt    Activity Tolerance  Patient tolerated treatment well    Behavior During Therapy  [facility] for tasks assessed/performed       Past Medical History:  Diagnosis Date  . Anemia   . Diabetic retinopathy (HCC) 03/23/2015  . GERD (gastroesophageal reflux disease) 01/05/2015  . Hypothyroid   . Osteopenia 03/03/2014  . Type 2 diabetes mellitus with microalbuminuria or microproteinuria 03/03/2014   Actos added January 2016.      Past Surgical History:  Procedure Laterality Date  . APPENDECTOMY  02/28/11  . CESAREAN SECTION    . LAPAROSCOPIC APPENDECTOMY  03/03/2011   Procedure: APPENDECTOMY LAPAROSCOPIC;  Surgeon: Valarie MerinoMatthew B Martin, MD;  Location: [facility];  Service: General;  Laterality: N/A;  . TONSILLECTOMY      There were no vitals filed for this visit.   Subjective Assessment - 10/01/18 1329    Subjective  She has history of falling down several flights of stairs and suffered a traumatic cervical cord injury at C4 with central cord compression, occurred on 02/21/18. [facility]. Was admitted 11 days (04/30/18-05/11/18) [facility] for [facility]. Has been seeing [facility] for the past 2 months. Has been walking with home health by using a platform walker and by holding a pillow with assistance. Pt has caregivers  daily. Currently present is Maddie, which is pt's caregiver and is with patient M-F from 1-5 (will be caregiver to come with pt to future appointments). Reports increased stiffness and pain with BLE, has been doing a lot of LE exercises with home health.    Patient is accompained by:  --   Maddie, caregiver   Pertinent History  quadriplegia C1-C4 incomplete (central cord syndrome) s/p spinal cord injury d/t fall w/ cervical spinal fusion surgery 02/2018, Admitted 11 days (04/30/18-05/11/18) [facility] for sepsis, neurogenic bowel/bladder,Hypothyroidism, osteopenia, type 2 diabetes    Patient Stated Goals  wants to be able to walk on her own, use her platform walker and do ADLs without so much help         [facility] PT Assessment - 10/01/18 1339      Assessment   Medical Diagnosis  central cord syndrome at C4 level, s/p posterior cervical fusion    Referring Provider (PT)  Sunnie NielsenNatalie Alexander, DO    Onset Date/Surgical Date  02/22/18    Hand Dominance  Right    Prior Therapy  acute, inpatient rehab, home health therapies      Precautions   Precautions  Fall    Precaution Comments  no driving, dexcom G6       Balance Screen   Has the patient fallen in the past 6 months  No      Home Environment  Living Environment  Private residence    Living Arrangements  Spouse/significant other   has caregivers daily    Available Help at Discharge  Family;Personal care attendant    Type of Home  House    Home Access  Ramped entrance    Home Layout  Able to live on main level with bedroom/bathroom    Home Equipment  Hospital bed;Wheelchair - manual;Other (comment)   standing frame, platform walker, has 3 manual w/cs, lift   Additional Comments  Has manual tilt in space w/c for pressure relief. Husband is about to have hernia surgery, daughter is about to come for about a month. has about 22 ft of walking space in sunroom      Prior Function   Level of Independence  Independent    Leisure  skydiving,  traveling, nascar      Cognition   Overall Cognitive Status  Within Functional Limits for tasks assessed      Sensation   Light Touch  Impaired Detail    Proprioception  Impaired by gross assessment;Impaired Detail    Proprioception Impaired Details  Impaired RLE;Impaired LLE    Additional Comments  Impaired LLE and RLE; impaired more proximally than distally, difficulty feeling light touch at heel with inconsistent responses R>L, only feels a temperature change when touching anterior thigh B, occasionally feels numb in anterior thigh. reports feeling "spongy" in her feet and it feels like she is walking on air at times. Proprioception; Unable to detect up/down at big toe B, intact at B ankles for DF/PF.       Posture/Postural Control   Posture/Postural Control  Postural limitations    Postural Limitations  Rounded Shoulders;Posterior pelvic tilt      ROM / Strength   AROM / PROM / Strength  PROM;Strength      AROM   Overall AROM   --    Overall AROM Comments  --      PROM   Overall PROM   Deficits    Overall PROM Comments  W/ knees bent, approx. only 5 deg DF with PROM B      Strength   Overall Strength Comments  3+/5 L hip flexor strength, 3-/5 R hip flexor strength, 4+/5 bilateral quadriceps and hamstrings, 4/5 L ankle DF, 4+/5 R ankle DF      Transfers   Transfers  Stand to Sit;Sit to Stand    Sit to Stand  3: Mod assist;Multiple attempts    Sit to Stand Details  Tactile cues for sequencing;Tactile cues for weight shifting    Sit to Stand Details (indicate cue type and reason)  Pt performing sit <> stand t/f from lower height bariatric w/c (provided from clinic d/t pt not bringing her manual w/c from home with her to therapy), had increased difficulty coming to stand from low surfaces with a seat with increased depth, req mod A from therapist and cueing for anterior weight shifting. Took multiple attempts to stand. Once in standing able to stand x2 minutes with min A from  therapist. Performed 2x during session for pressure relief. Per patient and caregiver, pt is able to perform sit <> stand to platform RW from her own w/c with supervision.     Stand to Sit  4: Min assist    Stand to Sit Details (indicate cue type and reason)  Verbal cues for precautions/safety;Verbal cues for sequencing    Comments  Unable to further assess transfers d/t time constraints during evaluation.  Ambulation/Gait   Gait Comments  Gait not assessed due to time constraints. has been ambulating at home with platform RW                Objective measurements completed on examination: See above findings.              PT Education - 10/02/18 1151    Education Details  clinical findings, POC    Person(s) Educated  Patient;Caregiver(s)    Methods  Explanation    Comprehension  Verbalized understanding       PT Short Term Goals - 10/02/18 1357      PT SHORT TERM GOAL #1   Title  Patient will be independent with initial HEP to build upon prior exercises from HHPT and build upon functional gains made in therapy. ALL STGS DUE 11/13/18    Time  6    Period  Weeks    Status  New    Target Date  11/13/18      PT SHORT TERM GOAL #2   Title  Patient will stand with supervision with platform walker for at least 3 minutes in order to improve standing tolerance for ADLs.    Time  6    Period  Weeks    Status  New      PT SHORT TERM GOAL #3   Title  Patient will perform sit <> stand from manual w/c > platform walker with supervision in order to improve transfers and decrease caregiver burden.    Time  6    Period  Weeks      PT SHORT TERM GOAL #4   Title  Gait goal as appropriate with platform RW.    Time  6    Period  Weeks    Status  New      PT SHORT TERM GOAL #5   Title  Patient will perform bed mobility with min A in order to decrease caregiver burden.    Time  6    Period  Weeks    Status  New        PT Long Term Goals - 10/02/18 1359      PT  LONG TERM GOAL #1   Title  Patient will be independent with final HEP in order to build upon functional gains made in therapy. ALL LTGs DUE 12/31/18    Time  12    Period  Weeks    Status  New    Target Date  12/25/18      PT LONG TERM GOAL #2   Title  Gait goal as appropriate with platform RW.    Time  12    Period  Weeks    Status  New      PT LONG TERM GOAL #3   Title  Patient will improve standing tolerance x10 minutes with supervision with platform RW in order to decrease caregiver burden and increase tolerance for ADLs.    Time  12    Period  Weeks    Status  New      PT LONG TERM GOAL #4   Title  Patient will perform stand pivot t/f from manual w/c > mat table with supervision and LRAD (platform RW vs. no device) in order to increase indpendence with transfers.    Time  12    Period  Weeks    Status  New      PT LONG TERM GOAL #5   Title  Patient  will perform bed mobility (rolling, sidelying > sit) with supervision in order to increase independence at home.    Time  12    Period  Weeks    Status  New             Plan - 10/02/18 1203    Clinical Impression Statement  Patient is a 78 year old female referred to Neuro OPPT for evaluation with primary concern of C4 central cord syndrome after falling down 14 steps, w/ posterior cervical fusion Jan 2020.[name]. Pt's PMH is significant for: Hypothyroidism, osteopenia, type 2 diabetes.   The following deficits were present during the exam: decreased LE strength, impaired standing balance and endurance, decreased ROM, impaired sensation, impaired proprioception. Pt would benefit from skilled PT to address these impairments and functional limitations to maximize functional mobility independence and decrease caregiver burden with transfers.    Personal Factors and Comorbidities  Past/Current Experience;Comorbidity 3+;Time since onset of injury/illness/exacerbation    Comorbidities  C4 central cord syndrome, Hypothyroidism, osteopenia,  type 2 diabetes.    Examination-Activity Limitations  Bed Mobility;Locomotion Level;Stand;Transfers    Stability/Clinical Decision Making  Evolving/Moderate complexity    Clinical Decision Making  Moderate    Rehab Potential  Good    PT Frequency  2x / week    PT Duration  12 weeks    PT Treatment/Interventions  ADLs/Self Care Home Management;DME Instruction;Gait training;Functional mobility training;Therapeutic activities;Therapeutic exercise;Balance training;Patient/family education;Neuromuscular re-education;Wheelchair mobility training;Passive range of motion    PT Next Visit Plan  Assess transfers, assess gait with platform walker (goal as appropriate), bed mobility. Standing tolerance for transfers/ADLs.  Condense HEP or upgrade if needed from current HEP from home health.    Consulted and Agree with Plan of Care  Patient;Family member/caregiver    Family Member Consulted  caregiver Maddie       Patient will benefit from skilled therapeutic intervention in order to improve the following deficits and impairments:  Abnormal gait, Decreased activity tolerance, Decreased balance, Decreased endurance, Decreased range of motion, Difficulty walking, Decreased strength, Impaired sensation, Pain  Visit Diagnosis: 1. Other symptoms and signs involving the nervous system   2. Muscle weakness (generalized)   3. Other disturbances of skin sensation   4. Difficulty in walking, not elsewhere classified        Problem List Patient Active Problem List   Diagnosis Date Noted  . Acute deep vein thrombosis (DVT) of left lower extremity (HCC) 07/12/2018  . History of sepsis 06/18/2018  . Hypotension 06/18/2018  . Quadriplegia, C1-C4 incomplete (HCC) 04/30/2018  . Central cord syndrome at C4 level of cervical spinal cord (HCC) 02/28/2018  . Impaired mobility and ADLs 02/28/2018  . Elevated blood pressure reading 05/15/2017  . De Quervain's tenosynovitis, right 01/20/2016  . Lumbago 01/18/2016   . Hypercalcemia 12/28/2015  . Ganglion cyst of wrist 10/27/2015  . Sciatica of right side 10/27/2015  . Right knee pain 10/27/2015  . Diabetic retinopathy (HCC) 03/23/2015  . GERD (gastroesophageal reflux disease) 01/05/2015  . Osteopenia 03/03/2014  . Type 2 diabetes mellitus with microalbuminuria or microproteinuria 03/03/2014  . Hypothyroid 02/28/2014  . Anxiety and depression 02/28/2014    Drake Leachhloe N Elea Holtzclaw, PT, DPT 10/02/2018, 2:22 PM  [facility] [address] [place], [place], [address] Phone: [phone]   Fax:  [phone]  Name: Veronica Gauzehyllis Weimann MRN: [id number] Date of Birth: 12/25/1940

## 2018-10-04 ENCOUNTER — Telehealth: Payer: Self-pay | Admitting: Family Medicine

## 2018-10-04 DIAGNOSIS — S14124S Central cord syndrome at C4 level of cervical spinal cord, sequela: Secondary | ICD-10-CM

## 2018-10-04 NOTE — Telephone Encounter (Signed)
Dr. Madilyn Fireman Veronica [name] is a patient of Dr. Redgie Grayer we have a referral for her to go to [facility] but patient would prefer to go to [facility] in [place] due to Transportation. She already spoke with [facility] and they can accomadate what she is needing done. She is being seen for Central Cord Syndrome at C4 level of Cervical spinal Cord, Sequela DX: H38.871L Can you please put a new referral in so i can send it to [facility]. Thank you   Tanna Furry, can you put this order in and I will sign it.  Thank you.  CM

## 2018-10-04 NOTE — Telephone Encounter (Signed)
Referral signed.  Thank you

## 2018-10-04 NOTE — Telephone Encounter (Addendum)
At covering provider's request - new referral order pended.

## 2018-10-08 NOTE — Telephone Encounter (Signed)
Spoke with patient and got her referrals straightened out and to the places she wanted them sent. - CF

## 2018-10-10 ENCOUNTER — Telehealth: Payer: Self-pay

## 2018-10-10 NOTE — Telephone Encounter (Signed)
Tanya from Dr. Katherine Roan [facility] Neurology office called stating that they are unable to evaluate pt. As per Lavella Lemons, pt must be over one year of having care from a surgeon. Since pt had surgical procedure on 03/12/18, pt will need to continue care with surgeon. Otherwise surgeon must contact Dr. Katherine Roan directly to discuss transferring care to Neurology for pt. Pt was made aware at time of appt with Dr. Katherine Roan of the situation. No other inquiries noted during the msg.

## 2018-10-10 NOTE — Telephone Encounter (Signed)
Noted, sounds reasonable, thanks

## 2018-10-19 ENCOUNTER — Ambulatory Visit: Payer: Medicare HMO | Admitting: Physical Therapy

## 2018-10-19 ENCOUNTER — Encounter: Payer: Medicare HMO | Admitting: Occupational Therapy

## 2018-10-22 DIAGNOSIS — S14109D Unspecified injury at unspecified level of cervical spinal cord, subsequent encounter: Secondary | ICD-10-CM | POA: Diagnosis not present

## 2018-10-22 DIAGNOSIS — F419 Anxiety disorder, unspecified: Secondary | ICD-10-CM | POA: Diagnosis not present

## 2018-10-22 DIAGNOSIS — S14154D Other incomplete lesion at C4 level of cervical spinal cord, subsequent encounter: Secondary | ICD-10-CM | POA: Diagnosis not present

## 2018-10-22 DIAGNOSIS — Z7409 Other reduced mobility: Secondary | ICD-10-CM | POA: Diagnosis not present

## 2018-10-22 DIAGNOSIS — Z789 Other specified health status: Secondary | ICD-10-CM | POA: Diagnosis not present

## 2018-10-22 DIAGNOSIS — Z981 Arthrodesis status: Secondary | ICD-10-CM | POA: Diagnosis not present

## 2018-10-24 ENCOUNTER — Telehealth: Payer: Self-pay

## 2018-10-24 DIAGNOSIS — M25512 Pain in left shoulder: Secondary | ICD-10-CM | POA: Diagnosis not present

## 2018-10-24 DIAGNOSIS — M6281 Muscle weakness (generalized): Secondary | ICD-10-CM | POA: Diagnosis not present

## 2018-10-24 DIAGNOSIS — R809 Proteinuria, unspecified: Secondary | ICD-10-CM | POA: Diagnosis not present

## 2018-10-24 DIAGNOSIS — R2689 Other abnormalities of gait and mobility: Secondary | ICD-10-CM | POA: Diagnosis not present

## 2018-10-24 DIAGNOSIS — M25511 Pain in right shoulder: Secondary | ICD-10-CM | POA: Diagnosis not present

## 2018-10-24 DIAGNOSIS — E1129 Type 2 diabetes mellitus with other diabetic kidney complication: Secondary | ICD-10-CM | POA: Diagnosis not present

## 2018-10-24 DIAGNOSIS — Z794 Long term (current) use of insulin: Secondary | ICD-10-CM | POA: Diagnosis not present

## 2018-10-24 NOTE — Telephone Encounter (Signed)
Should not need new referral since she is an established patient there

## 2018-10-24 NOTE — Telephone Encounter (Signed)
Patient called stating that neurologist she was referred to would not accept her as a patient because she was not a year post neurosurgery and they did not want to assume liability (?)  Patient states that she saw Dr Rosana Hoes with [facility] in [place] at one point.. wants to try him again  Does new referral need to be entered or can current referral be changed?

## 2018-10-24 NOTE — Telephone Encounter (Signed)
Left pt msg to call that office and try to get scheduled. Call back info provided in case any needs arise

## 2018-10-25 ENCOUNTER — Other Ambulatory Visit: Payer: Self-pay

## 2018-10-25 ENCOUNTER — Ambulatory Visit: Payer: Medicare PPO | Admitting: Osteopathic Medicine

## 2018-10-25 ENCOUNTER — Encounter: Payer: Medicare HMO | Admitting: Occupational Therapy

## 2018-10-25 ENCOUNTER — Ambulatory Visit: Payer: Medicare HMO | Admitting: Physical Therapy

## 2018-10-25 NOTE — Telephone Encounter (Signed)
Needs a refill of oxycodone for 10/01/18.

## 2018-10-26 MED ORDER — OXYCODONE HCL 5 MG PO TABS: 5.0000 mg | ORAL_TABLET | Freq: Four times a day (QID) | ORAL | 0 refills | Status: DC | PRN

## 2018-10-30 ENCOUNTER — Ambulatory Visit: Payer: Medicare HMO | Admitting: Physical Therapy

## 2018-10-30 ENCOUNTER — Encounter: Payer: Medicare HMO | Admitting: Occupational Therapy

## 2018-10-30 DIAGNOSIS — R8271 Bacteriuria: Secondary | ICD-10-CM | POA: Diagnosis not present

## 2018-10-30 DIAGNOSIS — N318 Other neuromuscular dysfunction of bladder: Secondary | ICD-10-CM | POA: Diagnosis not present

## 2018-10-31 DIAGNOSIS — M25511 Pain in right shoulder: Secondary | ICD-10-CM | POA: Diagnosis not present

## 2018-10-31 DIAGNOSIS — M25512 Pain in left shoulder: Secondary | ICD-10-CM | POA: Diagnosis not present

## 2018-10-31 DIAGNOSIS — G8250 Quadriplegia, unspecified: Secondary | ICD-10-CM | POA: Diagnosis not present

## 2018-10-31 DIAGNOSIS — R2689 Other abnormalities of gait and mobility: Secondary | ICD-10-CM | POA: Diagnosis not present

## 2018-10-31 DIAGNOSIS — M6281 Muscle weakness (generalized): Secondary | ICD-10-CM | POA: Diagnosis not present

## 2018-11-01 ENCOUNTER — Encounter: Payer: Medicare HMO | Admitting: Occupational Therapy

## 2018-11-01 ENCOUNTER — Ambulatory Visit: Payer: Medicare HMO | Admitting: Physical Therapy

## 2018-11-05 DIAGNOSIS — M25512 Pain in left shoulder: Secondary | ICD-10-CM | POA: Diagnosis not present

## 2018-11-05 DIAGNOSIS — M25511 Pain in right shoulder: Secondary | ICD-10-CM | POA: Diagnosis not present

## 2018-11-05 DIAGNOSIS — M6281 Muscle weakness (generalized): Secondary | ICD-10-CM | POA: Diagnosis not present

## 2018-11-05 DIAGNOSIS — R2689 Other abnormalities of gait and mobility: Secondary | ICD-10-CM | POA: Diagnosis not present

## 2018-11-06 ENCOUNTER — Encounter: Payer: Medicare HMO | Admitting: Occupational Therapy

## 2018-11-06 ENCOUNTER — Ambulatory Visit: Payer: Medicare HMO | Admitting: Physical Therapy

## 2018-11-07 ENCOUNTER — Other Ambulatory Visit: Payer: Self-pay | Admitting: Osteopathic Medicine

## 2018-11-07 DIAGNOSIS — Z888 Allergy status to other drugs, medicaments and biological substances status: Secondary | ICD-10-CM | POA: Diagnosis not present

## 2018-11-07 DIAGNOSIS — R2689 Other abnormalities of gait and mobility: Secondary | ICD-10-CM | POA: Diagnosis not present

## 2018-11-07 DIAGNOSIS — M6281 Muscle weakness (generalized): Secondary | ICD-10-CM | POA: Diagnosis not present

## 2018-11-07 DIAGNOSIS — S14109D Unspecified injury at unspecified level of cervical spinal cord, subsequent encounter: Secondary | ICD-10-CM | POA: Diagnosis not present

## 2018-11-07 DIAGNOSIS — Z880 Allergy status to penicillin: Secondary | ICD-10-CM | POA: Diagnosis not present

## 2018-11-07 DIAGNOSIS — M25512 Pain in left shoulder: Secondary | ICD-10-CM | POA: Diagnosis not present

## 2018-11-07 DIAGNOSIS — S14159D Other incomplete lesion at unspecified level of cervical spinal cord, subsequent encounter: Secondary | ICD-10-CM | POA: Diagnosis not present

## 2018-11-07 DIAGNOSIS — Z7409 Other reduced mobility: Secondary | ICD-10-CM | POA: Diagnosis not present

## 2018-11-07 DIAGNOSIS — Z882 Allergy status to sulfonamides status: Secondary | ICD-10-CM | POA: Diagnosis not present

## 2018-11-07 DIAGNOSIS — Z789 Other specified health status: Secondary | ICD-10-CM | POA: Diagnosis not present

## 2018-11-07 DIAGNOSIS — M25511 Pain in right shoulder: Secondary | ICD-10-CM | POA: Diagnosis not present

## 2018-11-07 NOTE — Telephone Encounter (Signed)
Requested medication (s) are due for refill today: yes  Requested medication (s) are on the active medication list: yes  Last refill:  09/14/2018  Future visit scheduled: no  Notes to clinic:  This refill cannot be delegated   Requested Prescriptions  Pending Prescriptions Disp Refills   baclofen (LIORESAL) 10 MG tablet [Pharmacy Med Name: BACLOFEN 10 MG TABLET] 90 tablet 0    Sig: TAKE 1/2 TABLET (5 MG) BY MOUTH 3 (THREE) TIMES DAILY AS NEEDED.     Not Delegated - Analgesics:  Muscle Relaxants Failed - 11/07/2018  1:12 AM      Failed - This refill cannot be delegated      Passed - Valid encounter within last 6 months    Recent Outpatient Visits          1 month ago Central cord syndrome at C4 level of cervical spinal cord, sequela [facility])   [facility], Lanelle Bal, DO   3 months ago Acute deep vein thrombosis (DVT) of femoral vein of left lower extremity [facility])   [facility], Lanelle Bal, DO   4 months ago Quadriplegia following spinal cord injury [facility])   [facility], Lanelle Bal, DO   4 months ago Hypotension, unspecified hypotension type   [facility], PA-C   4 months ago Quadriplegia following spinal cord injury [facility])   [place], Lanelle Bal, DO

## 2018-11-08 ENCOUNTER — Ambulatory Visit: Payer: Medicare HMO | Admitting: Physical Therapy

## 2018-11-08 ENCOUNTER — Encounter: Payer: Medicare HMO | Admitting: Occupational Therapy

## 2018-11-12 ENCOUNTER — Ambulatory Visit: Payer: Medicare HMO | Admitting: Rehabilitative and Restorative Service Providers"

## 2018-11-12 ENCOUNTER — Encounter: Payer: Medicare HMO | Admitting: Occupational Therapy

## 2018-11-12 DIAGNOSIS — M25511 Pain in right shoulder: Secondary | ICD-10-CM | POA: Diagnosis not present

## 2018-11-12 DIAGNOSIS — M25512 Pain in left shoulder: Secondary | ICD-10-CM | POA: Diagnosis not present

## 2018-11-12 DIAGNOSIS — R2689 Other abnormalities of gait and mobility: Secondary | ICD-10-CM | POA: Diagnosis not present

## 2018-11-12 DIAGNOSIS — M6281 Muscle weakness (generalized): Secondary | ICD-10-CM | POA: Diagnosis not present

## 2018-11-13 DIAGNOSIS — E785 Hyperlipidemia, unspecified: Secondary | ICD-10-CM | POA: Diagnosis not present

## 2018-11-13 DIAGNOSIS — E1169 Type 2 diabetes mellitus with other specified complication: Secondary | ICD-10-CM | POA: Diagnosis not present

## 2018-11-13 DIAGNOSIS — Z794 Long term (current) use of insulin: Secondary | ICD-10-CM | POA: Diagnosis not present

## 2018-11-13 DIAGNOSIS — E1129 Type 2 diabetes mellitus with other diabetic kidney complication: Secondary | ICD-10-CM | POA: Diagnosis not present

## 2018-11-13 DIAGNOSIS — E039 Hypothyroidism, unspecified: Secondary | ICD-10-CM | POA: Diagnosis not present

## 2018-11-13 DIAGNOSIS — R809 Proteinuria, unspecified: Secondary | ICD-10-CM | POA: Diagnosis not present

## 2018-11-14 DIAGNOSIS — M6281 Muscle weakness (generalized): Secondary | ICD-10-CM | POA: Diagnosis not present

## 2018-11-14 DIAGNOSIS — R2689 Other abnormalities of gait and mobility: Secondary | ICD-10-CM | POA: Diagnosis not present

## 2018-11-14 DIAGNOSIS — M25511 Pain in right shoulder: Secondary | ICD-10-CM | POA: Diagnosis not present

## 2018-11-14 DIAGNOSIS — M25512 Pain in left shoulder: Secondary | ICD-10-CM | POA: Diagnosis not present

## 2018-11-15 ENCOUNTER — Encounter: Payer: Medicare HMO | Admitting: Occupational Therapy

## 2018-11-15 ENCOUNTER — Ambulatory Visit: Payer: Medicare HMO | Admitting: Rehabilitative and Restorative Service Providers"

## 2018-11-19 ENCOUNTER — Encounter: Payer: Medicare HMO | Admitting: Occupational Therapy

## 2018-11-19 ENCOUNTER — Ambulatory Visit: Payer: Medicare HMO | Admitting: Physical Therapy

## 2018-11-19 DIAGNOSIS — M6281 Muscle weakness (generalized): Secondary | ICD-10-CM | POA: Diagnosis not present

## 2018-11-19 DIAGNOSIS — M25511 Pain in right shoulder: Secondary | ICD-10-CM | POA: Diagnosis not present

## 2018-11-19 DIAGNOSIS — R2689 Other abnormalities of gait and mobility: Secondary | ICD-10-CM | POA: Diagnosis not present

## 2018-11-19 DIAGNOSIS — M25512 Pain in left shoulder: Secondary | ICD-10-CM | POA: Diagnosis not present

## 2018-11-21 ENCOUNTER — Encounter: Payer: Medicare HMO | Admitting: Occupational Therapy

## 2018-11-21 ENCOUNTER — Ambulatory Visit: Payer: Medicare HMO | Admitting: Rehabilitative and Restorative Service Providers"

## 2018-11-21 DIAGNOSIS — R2689 Other abnormalities of gait and mobility: Secondary | ICD-10-CM | POA: Diagnosis not present

## 2018-11-21 DIAGNOSIS — M25512 Pain in left shoulder: Secondary | ICD-10-CM | POA: Diagnosis not present

## 2018-11-21 DIAGNOSIS — M25511 Pain in right shoulder: Secondary | ICD-10-CM | POA: Diagnosis not present

## 2018-11-21 DIAGNOSIS — M6281 Muscle weakness (generalized): Secondary | ICD-10-CM | POA: Diagnosis not present

## 2018-11-26 DIAGNOSIS — R2689 Other abnormalities of gait and mobility: Secondary | ICD-10-CM | POA: Diagnosis not present

## 2018-11-26 DIAGNOSIS — M6281 Muscle weakness (generalized): Secondary | ICD-10-CM | POA: Diagnosis not present

## 2018-11-26 DIAGNOSIS — M25512 Pain in left shoulder: Secondary | ICD-10-CM | POA: Diagnosis not present

## 2018-11-26 DIAGNOSIS — M25511 Pain in right shoulder: Secondary | ICD-10-CM | POA: Diagnosis not present

## 2018-11-28 DIAGNOSIS — M6281 Muscle weakness (generalized): Secondary | ICD-10-CM | POA: Diagnosis not present

## 2018-11-28 DIAGNOSIS — M25511 Pain in right shoulder: Secondary | ICD-10-CM | POA: Diagnosis not present

## 2018-11-28 DIAGNOSIS — M25512 Pain in left shoulder: Secondary | ICD-10-CM | POA: Diagnosis not present

## 2018-11-28 DIAGNOSIS — R2689 Other abnormalities of gait and mobility: Secondary | ICD-10-CM | POA: Diagnosis not present

## 2018-11-30 DIAGNOSIS — G8250 Quadriplegia, unspecified: Secondary | ICD-10-CM | POA: Diagnosis not present

## 2018-12-03 DIAGNOSIS — M6281 Muscle weakness (generalized): Secondary | ICD-10-CM | POA: Diagnosis not present

## 2018-12-03 DIAGNOSIS — M25512 Pain in left shoulder: Secondary | ICD-10-CM | POA: Diagnosis not present

## 2018-12-03 DIAGNOSIS — R2689 Other abnormalities of gait and mobility: Secondary | ICD-10-CM | POA: Diagnosis not present

## 2018-12-03 DIAGNOSIS — M25511 Pain in right shoulder: Secondary | ICD-10-CM | POA: Diagnosis not present

## 2018-12-05 DIAGNOSIS — M25511 Pain in right shoulder: Secondary | ICD-10-CM | POA: Diagnosis not present

## 2018-12-05 DIAGNOSIS — M6281 Muscle weakness (generalized): Secondary | ICD-10-CM | POA: Diagnosis not present

## 2018-12-05 DIAGNOSIS — M25512 Pain in left shoulder: Secondary | ICD-10-CM | POA: Diagnosis not present

## 2018-12-05 DIAGNOSIS — R2689 Other abnormalities of gait and mobility: Secondary | ICD-10-CM | POA: Diagnosis not present

## 2018-12-07 ENCOUNTER — Telehealth: Payer: Self-pay

## 2018-12-07 DIAGNOSIS — I82412 Acute embolism and thrombosis of left femoral vein: Secondary | ICD-10-CM

## 2018-12-07 NOTE — Telephone Encounter (Signed)
Order is in cab we call patient and see where she wants it done? Thanks!

## 2018-12-07 NOTE — Telephone Encounter (Signed)
Patient left VM on triage line stating that she needs to have a scan done for an ultrasound of blood clot before she can come off of her Eliquis.   She wanted to know if Dr Sheppard Coil would order this and she wants it scheduled for a Monday or Wednesday  Please advise

## 2018-12-07 NOTE — Telephone Encounter (Signed)
Do you want a DVT US? If so - the order will need to be changed to [place] lower ext venous rather than the vascular order placed

## 2018-12-10 ENCOUNTER — Ambulatory Visit: Payer: Medicare HMO | Admitting: Osteopathic Medicine

## 2018-12-10 ENCOUNTER — Encounter: Payer: Self-pay | Admitting: Physical Therapy

## 2018-12-10 DIAGNOSIS — M6281 Muscle weakness (generalized): Secondary | ICD-10-CM | POA: Diagnosis not present

## 2018-12-10 DIAGNOSIS — R2689 Other abnormalities of gait and mobility: Secondary | ICD-10-CM | POA: Diagnosis not present

## 2018-12-10 DIAGNOSIS — M25512 Pain in left shoulder: Secondary | ICD-10-CM | POA: Diagnosis not present

## 2018-12-10 DIAGNOSIS — M25511 Pain in right shoulder: Secondary | ICD-10-CM | POA: Diagnosis not present

## 2018-12-10 NOTE — Therapy (Signed)
[place] [address] [place], [place], [address] Phone: [phone]   Fax:  [phone]  Patient Details  Name: Veronica [name] MRN: [id number] Date of Birth: 11/28/1940 Referring Provider:  No ref. provider found  Encounter Date: 12/10/2018  PHYSICAL THERAPY DISCHARGE SUMMARY  Visits from Start of Care: 1 - eval only  Current functional level related to goals / functional outcomes: Not able to assess, pt did not return after evaluation. Pt called to cancel all remainder OT and PT appointments and would not be returning.    Remaining deficits: See evaluation.   Education / Equipment: N/A.  Plan: Patient agrees to discharge.  Patient goals were not met. Patient is being discharged due to not returning since the last visit.  ?????       Arliss Journey, PT, DPT 12/10/2018, 11:34 AM  [place] [address] [place], [place], [address] Phone: [phone]   Fax:  [phone]

## 2018-12-10 NOTE — Telephone Encounter (Signed)
Fixed it, thanks!

## 2018-12-11 ENCOUNTER — Other Ambulatory Visit: Payer: Self-pay

## 2018-12-11 ENCOUNTER — Ambulatory Visit: Payer: Medicare HMO

## 2018-12-11 DIAGNOSIS — I82511 Chronic embolism and thrombosis of right femoral vein: Secondary | ICD-10-CM | POA: Diagnosis not present

## 2018-12-11 NOTE — Telephone Encounter (Signed)
Pt advised.

## 2018-12-12 ENCOUNTER — Telehealth: Payer: Self-pay

## 2018-12-12 DIAGNOSIS — M25511 Pain in right shoulder: Secondary | ICD-10-CM | POA: Diagnosis not present

## 2018-12-12 DIAGNOSIS — R2689 Other abnormalities of gait and mobility: Secondary | ICD-10-CM | POA: Diagnosis not present

## 2018-12-12 DIAGNOSIS — M25512 Pain in left shoulder: Secondary | ICD-10-CM | POA: Diagnosis not present

## 2018-12-12 DIAGNOSIS — M6281 Muscle weakness (generalized): Secondary | ICD-10-CM | POA: Diagnosis not present

## 2018-12-12 NOTE — Telephone Encounter (Signed)
No evidence of DVT    US Venous Img Lower Unilateral Left  Addendum Date: 12/11/2018   ADDENDUM REPORT: 12/11/2018 17:22 ADDENDUM: Correction, no evidence of deep venous thrombosis in the LEFT lower extremity. Electronically Signed   By: Jacqulynn Cadet M.D.   On: 12/11/2018 17:22   Result Date: 12/11/2018 CLINICAL DATA:  78 year old female with a history of DVT in April 2020 EXAM: RIGHT LOWER EXTREMITY VENOUS DOPPLER ULTRASOUND TECHNIQUE: Gray-scale sonography with graded compression, as well as color Doppler and duplex ultrasound were performed to evaluate the lower extremity deep venous systems from the level of the common femoral vein and including the common femoral, femoral, profunda femoral, popliteal and calf veins including the posterior tibial, peroneal and gastrocnemius veins when visible. The superficial great saphenous vein was also interrogated. Spectral Doppler was utilized to evaluate flow at rest and with distal augmentation maneuvers in the common femoral, femoral and popliteal veins. COMPARISON:  None. FINDINGS: Contralateral Common Femoral Vein: Respiratory phasicity is normal and symmetric with the symptomatic side. No evidence of thrombus. Normal compressibility. Common Femoral Vein: The common femoral vein is only partially compressible. There is eccentric wall thickening consistent with the residua of prior DVT. This is nonocclusive. There is evidence of color flow on color Doppler imaging. Saphenofemoral Junction: Chronic eccentric wall thickening extends into the saphenofemoral junction. No evidence of occlusion. Profunda Femoral Vein: Chronic eccentric wall thickening extends into the proximal profunda femoral vein which is partially compressible. Positive color flow on color Doppler imaging. Femoral Vein: Chronic eccentric wall thickening extends into the proximal femoral vein which is partially compressible. Positive color flow on color Doppler imaging. Popliteal Vein: No  evidence of thrombus. Normal compressibility, respiratory phasicity and response to augmentation. Calf Veins: No evidence of thrombus. Normal compressibility and flow on color Doppler imaging. Superficial Great Saphenous Vein: No evidence of thrombus. Normal compressibility. Venous Reflux:  None. Other Findings:  None. IMPRESSION: 1. Chronic and partially recanalized deep venous thrombus within the right common femoral, proximal femoral and proximal profunda femoral veins. 2. No convincing evidence of acute deep venous thrombus in the right lower extremity. Electronically Signed: By: Jacqulynn Cadet M.D. On: 12/11/2018 16:23

## 2018-12-12 NOTE — Telephone Encounter (Signed)
[name]'s husband is calling for the results of the ultra sound.

## 2018-12-12 NOTE — Telephone Encounter (Signed)
Pt's husband called requesting results for DVT scan for pt. Pls advise, thanks.

## 2018-12-13 ENCOUNTER — Other Ambulatory Visit: Payer: Self-pay

## 2018-12-13 MED ORDER — ELIQUIS 5 MG PO TABS: 5.0000 mg | ORAL_TABLET | Freq: Two times a day (BID) | ORAL | 1 refills | Status: DC

## 2018-12-13 NOTE — Telephone Encounter (Signed)
Pt was notified of DVT scan results earlier this morning. No other inquiries during call.

## 2018-12-17 ENCOUNTER — Ambulatory Visit: Payer: Medicare HMO | Admitting: Osteopathic Medicine

## 2018-12-17 DIAGNOSIS — M6281 Muscle weakness (generalized): Secondary | ICD-10-CM | POA: Diagnosis not present

## 2018-12-17 DIAGNOSIS — M25512 Pain in left shoulder: Secondary | ICD-10-CM | POA: Diagnosis not present

## 2018-12-17 DIAGNOSIS — M25511 Pain in right shoulder: Secondary | ICD-10-CM | POA: Diagnosis not present

## 2018-12-17 DIAGNOSIS — R2689 Other abnormalities of gait and mobility: Secondary | ICD-10-CM | POA: Diagnosis not present

## 2018-12-19 ENCOUNTER — Encounter: Payer: Self-pay | Admitting: Osteopathic Medicine

## 2018-12-19 ENCOUNTER — Ambulatory Visit (INDEPENDENT_AMBULATORY_CARE_PROVIDER_SITE_OTHER): Payer: Medicare HMO | Admitting: Osteopathic Medicine

## 2018-12-19 VITALS — BP 114/75 | HR 78 | Temp 97.6°F

## 2018-12-19 DIAGNOSIS — S14124S Central cord syndrome at C4 level of cervical spinal cord, sequela: Secondary | ICD-10-CM | POA: Diagnosis not present

## 2018-12-19 DIAGNOSIS — F329 Major depressive disorder, single episode, unspecified: Secondary | ICD-10-CM

## 2018-12-19 DIAGNOSIS — Z789 Other specified health status: Secondary | ICD-10-CM

## 2018-12-19 DIAGNOSIS — F419 Anxiety disorder, unspecified: Secondary | ICD-10-CM

## 2018-12-19 DIAGNOSIS — Z86718 Personal history of other venous thrombosis and embolism: Secondary | ICD-10-CM

## 2018-12-19 DIAGNOSIS — Z7409 Other reduced mobility: Secondary | ICD-10-CM | POA: Diagnosis not present

## 2018-12-19 DIAGNOSIS — M25511 Pain in right shoulder: Secondary | ICD-10-CM | POA: Diagnosis not present

## 2018-12-19 DIAGNOSIS — R2689 Other abnormalities of gait and mobility: Secondary | ICD-10-CM | POA: Diagnosis not present

## 2018-12-19 DIAGNOSIS — M6281 Muscle weakness (generalized): Secondary | ICD-10-CM | POA: Diagnosis not present

## 2018-12-19 DIAGNOSIS — F32A Depression, unspecified: Secondary | ICD-10-CM

## 2018-12-19 DIAGNOSIS — M25512 Pain in left shoulder: Secondary | ICD-10-CM | POA: Diagnosis not present

## 2018-12-19 MED ORDER — GABAPENTIN 100 MG PO CAPS: 100.0000 mg | ORAL_CAPSULE | Freq: Three times a day (TID) | ORAL | 1 refills | Status: DC

## 2018-12-19 MED ORDER — CITALOPRAM HYDROBROMIDE 40 MG PO TABS: 40.0000 mg | ORAL_TABLET | Freq: Every day | ORAL | 1 refills | Status: DC

## 2018-12-19 MED ORDER — OXYCODONE HCL 5 MG PO TABS: 5.0000 mg | ORAL_TABLET | Freq: Four times a day (QID) | ORAL | 0 refills | Status: DC | PRN

## 2018-12-19 MED ORDER — ARIPIPRAZOLE 5 MG PO TABS: 5.0000 mg | ORAL_TABLET | Freq: Every day | ORAL | 1 refills | Status: DC

## 2018-12-19 NOTE — Patient Instructions (Signed)
We are stopping Cymbalta. We are restarting citalopram/Celexa at 40 mg which is maximum recommended dose.  I know you were on a higher dose in the past but since we are unable to confirm an EKG, I do not feel comfortable putting you on a higher dose right now.  Let us try the Abilify 5 mg daily in addition to the Celexa.  I have decreased the gabapentin from 300 mg to 100 mg and refilled oxycodone pain medication.

## 2018-12-19 NOTE — Progress Notes (Signed)
Virtual Visit via Phone  I connected with      Veronica [name] on 12/19/18 at 8:56 AM by a telemedicine application and verified that I am speaking with the correct person using two identifiers.  Patient is at home I am in office    I discussed the limitations of evaluation and management by telemedicine and the availability of in person appointments. The patient expressed understanding and agreed to proceed.  History of Present Illness: Veronica [name] is a 78 y.o. female who would like to discuss medications    DVT: leg still swelling after exercises and therapy. DVT was initially diagnosed this spring, has been on Eliquis for probably about 6 months at this point.  Recent ultrasound showed no acute DVT, some chronic DVT with recanalization.  Cymbalta was D/C d/t concern for suicidal ideations, PM&R had suggested this medication initially as well as increasing the gabapentin, patient has not been able to tolerate this.  See below regarding gabapentin.  Patient has previously been on Wellbutrin for quite some time but this did not seem to be helpful for her and she felt kind of manic on this medication.  Previously on Celexa which worked pretty well for her but she was taking 60 mg dose.  Patient reports significant depression due to her medical issues.  Gabapentin 300 mg "I can't really function on this."  Patient would like to decrease the gabapentin      Observations/Objective: BP 114/75 (Patient Position: Sitting, Cuff Size: Normal)   Pulse 78   Temp 97.6 F (36.4 C) (Oral)  BP Readings from Last 3 Encounters:  12/19/18 114/75  07/11/18 112/64  07/03/18 110/70   Exam: Normal Speech.    Lab and Radiology Results No results found for this or any previous visit (from the past 72 hour(s)). No results found.     Assessment and Plan: 78 y.o. female with The primary encounter diagnosis was Impaired mobility and ADLs. Diagnoses of Anxiety and depression, Central cord  syndrome at C4 level of cervical spinal cord, sequela (HCC), and History of DVT of lower extremity were also pertinent to this visit.  Discontinue Cymbalta.  Since unable to get EKG to confirm QT interval, will keep Celexa at current recommended max dose of 40 mg, will add Abilify to augment.  Trial lower dose gabapentin to take as needed.  PDMP reviewed during this encounter. No orders of the defined types were placed in this encounter.  Meds ordered this encounter  Medications  . ARIPiprazole (ABILIFY) 5 MG tablet    Sig: Take 1 tablet (5 mg total) by mouth daily. Start at 1/2 tablet (2.5 mg total) po daily for first week    Dispense:  30 tablet    Refill:  1  . gabapentin (NEURONTIN) 100 MG capsule    Sig: Take 1-3 capsules (100-300 mg total) by mouth 3 (three) times daily.    Dispense:  270 capsule    Refill:  1  . oxyCODONE (OXY IR/ROXICODONE) 5 MG immediate release tablet    Sig: Take 1-2 tablets (5-10 mg total) by mouth every 6 (six) hours as needed for severe pain or breakthrough pain.    Dispense:  120 tablet    Refill:  0  . citalopram (CELEXA) 40 MG tablet    Sig: Take 1 tablet (40 mg total) by mouth daily.    Dispense:  90 tablet    Refill:  1      Follow Up Instructions: Return in about 4  weeks (around 01/16/2019) for Phone/virtual visit to recheck on medication adjustments for depression / pain .    I discussed the assessment and treatment plan with the patient. The patient was provided an opportunity to ask questions and all were answered. The patient agreed with the plan and demonstrated an understanding of the instructions.   The patient was advised to call back or seek an in-person evaluation if any new concerns, if symptoms worsen or if the condition fails to improve as anticipated.  30 minutes of non-face-to-face time was provided during this encounter.                      Historical information moved to improve visibility of  documentation.  Past Medical History:  Diagnosis Date  . Anemia   . Diabetic retinopathy (HCC) 03/23/2015  . GERD (gastroesophageal reflux disease) 01/05/2015  . Hypothyroid   . Osteopenia 03/03/2014  . Type 2 diabetes mellitus with microalbuminuria or microproteinuria 03/03/2014   Actos added January 2016.     Past Surgical History:  Procedure Laterality Date  . APPENDECTOMY  02/28/11  . CESAREAN SECTION    . LAPAROSCOPIC APPENDECTOMY  03/03/2011   Procedure: APPENDECTOMY LAPAROSCOPIC;  Surgeon: Valarie Merino, MD;  Location: [facility];  Service: General;  Laterality: N/A;  . TONSILLECTOMY     Social History   Tobacco Use  . Smoking status: Never Smoker  . Smokeless tobacco: Never Used  Substance Use Topics  . Alcohol use: No   family history includes Cancer in her father and mother; Diabetes in her mother; Hypertension in her mother; Stroke in her mother.  Medications: Current Outpatient Medications  Medication Sig Dispense Refill  . Accu-Chek Softclix Lancets lancets Use as instructed up to qid 200 each 99  . acetaminophen (TYLENOL) 500 MG tablet Take by mouth.    . Alcohol Swabs PADS Per insurance coverage, use as directed w/ glucose testing 120 each 99  . AMBULATORY NON FORMULARY MEDICATION Medication Name: purwick external urinary catheter. Sig: apply externally as directed PRN urination 100 Units 99  . baclofen (LIORESAL) 10 MG tablet TAKE 1/2 TABLET (5 MG) BY MOUTH 3 (THREE) TIMES DAILY AS NEEDED. 90 tablet 1  . bisacodyl (DULCOLAX) 10 MG suppository Place rectally.    [name] doxepin (SINEQUAN) 10 MG capsule Take 1 capsule (10 mg total) by mouth at bedtime. 90 capsule 1  . ELIQUIS 5 MG TABS tablet Take 1 tablet (5 mg total) by mouth 2 (two) times daily. 180 tablet 1  . glucose blood test strip Use as instructed up to qid 200 each 99  . insulin aspart (FIASP FLEXTOUCH) 100 UNIT/ML FlexPen Inject into the skin.    [name] insulin degludec (TRESIBA) 100 UNIT/ML SOPN FlexTouch Pen Inject  into the skin.    [name] insulin NPH Human (HUMULIN N,NOVOLIN N) 100 UNIT/ML injection Inject into the skin.    [name] insulin regular (NOVOLIN R,HUMULIN R) 100 units/mL injection Inject into the skin.    [name] levothyroxine (SYNTHROID, LEVOTHROID) 88 MCG tablet Take 1 tablet (88 mcg total) by mouth daily. FOLLOWING WITH ENDOCRINOLOGY DR LEVY 1 tablet 0  . metFORMIN (GLUCOPHAGE-XR) 500 MG 24 hr tablet Take by mouth. FOLLOWING W/ ENDOCRINOLOGY DR. Shawnee Knapp    . oxyCODONE (OXY IR/ROXICODONE) 5 MG immediate release tablet Take 1-2 tablets (5-10 mg total) by mouth every 6 (six) hours as needed for severe pain or breakthrough pain. 120 tablet 0  . ranitidine (ZANTAC) 300 MG capsule Take 1 capsule (  300 mg total) by mouth every evening. 90 capsule 3  . ARIPiprazole (ABILIFY) 5 MG tablet Take 1 tablet (5 mg total) by mouth daily. Start at 1/2 tablet (2.5 mg total) po daily for first week 30 tablet 1  . citalopram (CELEXA) 40 MG tablet Take 1 tablet (40 mg total) by mouth daily. 90 tablet 1  . clonazePAM (KLONOPIN) 0.5 MG tablet Take 1 tablet (0.5 mg total) by mouth 3 (three) times daily as needed for up to 30 days (#90 for thirty days). for anxiety 90 tablet 2  . gabapentin (NEURONTIN) 100 MG capsule Take 1-3 capsules (100-300 mg total) by mouth 3 (three) times daily. 270 capsule 1   No current facility-administered medications for this visit.    Allergies  Allergen Reactions  . Dulaglutide Palpitations    SOB. N/V  . Cephalexin Other (See Comments)    hives   . Codeine Hives and Nausea And Vomiting  . Glimepiride Hives  . Penicillins Rash  . Sitagliptin Other (See Comments)    Insomnia   . Sulfamethoxazole-Trimethoprim Hives  . Duloxetine Other (See Comments)    Suicidal Thoughts  . Ezetimibe   . Gabapentin Other (See Comments)    Feels out of it  . Guaifenesin   . Metformin Other (See Comments)    Dizzy  . Metformin And Related     diarrhea  . Other Other (See Comments)    All oral diabetic drugs:  nausea, vomiting, dizziness Unknown antibiotic: "starts with a P" - rash  . Penicillin G Itching  . Pseudoephedrine Hcl   . Sulfa Antibiotics Hives and Nausea And Vomiting  . Sulfamethoxazole Itching    PDMP reviewed during this encounter. No orders of the defined types were placed in this encounter.  Meds ordered this encounter  Medications  . ARIPiprazole (ABILIFY) 5 MG tablet    Sig: Take 1 tablet (5 mg total) by mouth daily. Start at 1/2 tablet (2.5 mg total) po daily for first week    Dispense:  30 tablet    Refill:  1  . gabapentin (NEURONTIN) 100 MG capsule    Sig: Take 1-3 capsules (100-300 mg total) by mouth 3 (three) times daily.    Dispense:  270 capsule    Refill:  1  . oxyCODONE (OXY IR/ROXICODONE) 5 MG immediate release tablet    Sig: Take 1-2 tablets (5-10 mg total) by mouth every 6 (six) hours as needed for severe pain or breakthrough pain.    Dispense:  120 tablet    Refill:  0  . citalopram (CELEXA) 40 MG tablet    Sig: Take 1 tablet (40 mg total) by mouth daily.    Dispense:  90 tablet    Refill:  1

## 2018-12-21 ENCOUNTER — Other Ambulatory Visit: Payer: Self-pay | Admitting: Osteopathic Medicine

## 2018-12-21 NOTE — Telephone Encounter (Signed)
Requested medication (s) are due for refill today: yes  Requested medication (s) are on the active medication list: yes  Last refill:  11/07/2018  Future visit scheduled: no  Notes to clinic:  Refill cannot be delegated    Requested Prescriptions  Pending Prescriptions Disp Refills   Baclofen 5 MG TABS [Pharmacy Med Name: BACLOFEN 5 MG Tablet] 270 tablet     Sig: TAKE 1 TABLET THREE TIMES DAILY AS NEEDED     Not Delegated - Analgesics:  Muscle Relaxants Failed - 12/21/2018 10:28 AM      Failed - This refill cannot be delegated      Passed - Valid encounter within last 6 months    Recent Outpatient Visits          2 days ago Impaired mobility and ADLs   [facility], Lanelle Bal, DO   3 months ago Central cord syndrome at C4 level of cervical spinal cord, sequela [facility])   [facility], Lanelle Bal, DO   5 months ago Acute deep vein thrombosis (DVT) of femoral vein of left lower extremity [facility])    [facility], Lanelle Bal, DO   5 months ago Quadriplegia following spinal cord injury [facility])   [place], Lanelle Bal, DO   6 months ago Hypotension, unspecified hypotension type   [facility], [place], [place]

## 2018-12-22 DIAGNOSIS — E1165 Type 2 diabetes mellitus with hyperglycemia: Secondary | ICD-10-CM | POA: Diagnosis not present

## 2018-12-24 DIAGNOSIS — M25512 Pain in left shoulder: Secondary | ICD-10-CM | POA: Diagnosis not present

## 2018-12-24 DIAGNOSIS — M25511 Pain in right shoulder: Secondary | ICD-10-CM | POA: Diagnosis not present

## 2018-12-24 DIAGNOSIS — R2689 Other abnormalities of gait and mobility: Secondary | ICD-10-CM | POA: Diagnosis not present

## 2018-12-24 DIAGNOSIS — M6281 Muscle weakness (generalized): Secondary | ICD-10-CM | POA: Diagnosis not present

## 2018-12-25 ENCOUNTER — Telehealth: Payer: Self-pay

## 2018-12-25 DIAGNOSIS — F329 Major depressive disorder, single episode, unspecified: Secondary | ICD-10-CM

## 2018-12-25 DIAGNOSIS — F32A Depression, unspecified: Secondary | ICD-10-CM

## 2018-12-25 MED ORDER — CLONAZEPAM 0.5 MG PO TABS: 0.5000 mg | ORAL_TABLET | Freq: Three times a day (TID) | ORAL | 2 refills | Status: DC | PRN

## 2018-12-25 NOTE — Telephone Encounter (Signed)
[facility] pharmacy requesting med refill for clonazepam.

## 2018-12-31 DIAGNOSIS — M25512 Pain in left shoulder: Secondary | ICD-10-CM | POA: Diagnosis not present

## 2018-12-31 DIAGNOSIS — G8250 Quadriplegia, unspecified: Secondary | ICD-10-CM | POA: Diagnosis not present

## 2018-12-31 DIAGNOSIS — M25511 Pain in right shoulder: Secondary | ICD-10-CM | POA: Diagnosis not present

## 2018-12-31 DIAGNOSIS — M6281 Muscle weakness (generalized): Secondary | ICD-10-CM | POA: Diagnosis not present

## 2018-12-31 DIAGNOSIS — R2689 Other abnormalities of gait and mobility: Secondary | ICD-10-CM | POA: Diagnosis not present

## 2019-01-02 DIAGNOSIS — R2689 Other abnormalities of gait and mobility: Secondary | ICD-10-CM | POA: Diagnosis not present

## 2019-01-02 DIAGNOSIS — M25511 Pain in right shoulder: Secondary | ICD-10-CM | POA: Diagnosis not present

## 2019-01-02 DIAGNOSIS — M25512 Pain in left shoulder: Secondary | ICD-10-CM | POA: Diagnosis not present

## 2019-01-02 DIAGNOSIS — M6281 Muscle weakness (generalized): Secondary | ICD-10-CM | POA: Diagnosis not present

## 2019-01-07 DIAGNOSIS — M6281 Muscle weakness (generalized): Secondary | ICD-10-CM | POA: Diagnosis not present

## 2019-01-07 DIAGNOSIS — M25511 Pain in right shoulder: Secondary | ICD-10-CM | POA: Diagnosis not present

## 2019-01-07 DIAGNOSIS — R2689 Other abnormalities of gait and mobility: Secondary | ICD-10-CM | POA: Diagnosis not present

## 2019-01-07 DIAGNOSIS — M25512 Pain in left shoulder: Secondary | ICD-10-CM | POA: Diagnosis not present

## 2019-01-09 DIAGNOSIS — S14109D Unspecified injury at unspecified level of cervical spinal cord, subsequent encounter: Secondary | ICD-10-CM | POA: Diagnosis not present

## 2019-01-09 DIAGNOSIS — S14159D Other incomplete lesion at unspecified level of cervical spinal cord, subsequent encounter: Secondary | ICD-10-CM | POA: Diagnosis not present

## 2019-01-10 DIAGNOSIS — R2689 Other abnormalities of gait and mobility: Secondary | ICD-10-CM | POA: Diagnosis not present

## 2019-01-10 DIAGNOSIS — M25511 Pain in right shoulder: Secondary | ICD-10-CM | POA: Diagnosis not present

## 2019-01-10 DIAGNOSIS — M6281 Muscle weakness (generalized): Secondary | ICD-10-CM | POA: Diagnosis not present

## 2019-01-10 DIAGNOSIS — M25512 Pain in left shoulder: Secondary | ICD-10-CM | POA: Diagnosis not present

## 2019-01-14 ENCOUNTER — Telehealth: Payer: Self-pay

## 2019-01-14 DIAGNOSIS — M6281 Muscle weakness (generalized): Secondary | ICD-10-CM | POA: Diagnosis not present

## 2019-01-14 DIAGNOSIS — M25511 Pain in right shoulder: Secondary | ICD-10-CM | POA: Diagnosis not present

## 2019-01-14 DIAGNOSIS — R2689 Other abnormalities of gait and mobility: Secondary | ICD-10-CM | POA: Diagnosis not present

## 2019-01-14 DIAGNOSIS — M25512 Pain in left shoulder: Secondary | ICD-10-CM | POA: Diagnosis not present

## 2019-01-14 MED ORDER — ARIPIPRAZOLE 5 MG PO TABS: 5.0000 mg | ORAL_TABLET | Freq: Every day | ORAL | 1 refills | Status: DC

## 2019-01-14 NOTE — Telephone Encounter (Signed)
Pt left a vm msg with an update regarding aripiprazole rx. As per pt, anti-depressant is working really well. She notices a big difference since taking med. Pt is requesting 90 d/s med refill to be sent to [facility]. Pls advise if sig will remain the same. Thanks.

## 2019-01-14 NOTE — Telephone Encounter (Signed)
Sent 90 days w/ 1 refill

## 2019-01-15 NOTE — Telephone Encounter (Signed)
Left a detailed vm msg for pt regarding med refill sent to local pharmacy. Direct call back info provided.  

## 2019-01-16 DIAGNOSIS — R2689 Other abnormalities of gait and mobility: Secondary | ICD-10-CM | POA: Diagnosis not present

## 2019-01-16 DIAGNOSIS — M25511 Pain in right shoulder: Secondary | ICD-10-CM | POA: Diagnosis not present

## 2019-01-16 DIAGNOSIS — M6281 Muscle weakness (generalized): Secondary | ICD-10-CM | POA: Diagnosis not present

## 2019-01-21 ENCOUNTER — Telehealth: Payer: Self-pay

## 2019-01-21 NOTE — Telephone Encounter (Signed)
Veronica [name] states she has constipation for 3 days. She has tried mineral oil, a Dulcolax stool softner and enema. She would like recommendations on what she can take for constipation.

## 2019-01-21 NOTE — Telephone Encounter (Signed)
I called the patient and she is agreeable to the PCP recommendations. She will call the office if she has any severe abdominal pain. She reports that she had a small bowel movement a little earlier this morning.

## 2019-01-21 NOTE — Telephone Encounter (Signed)
MiraLax 17 g 2-3 times per day until normal BM. If painful abdomen and/or nausea/vomiting, may need urgent evaluation for obstruction

## 2019-01-23 DIAGNOSIS — R2689 Other abnormalities of gait and mobility: Secondary | ICD-10-CM | POA: Diagnosis not present

## 2019-01-23 DIAGNOSIS — M25511 Pain in right shoulder: Secondary | ICD-10-CM | POA: Diagnosis not present

## 2019-01-23 DIAGNOSIS — M25512 Pain in left shoulder: Secondary | ICD-10-CM | POA: Diagnosis not present

## 2019-01-23 DIAGNOSIS — M6281 Muscle weakness (generalized): Secondary | ICD-10-CM | POA: Diagnosis not present

## 2019-01-25 DIAGNOSIS — E039 Hypothyroidism, unspecified: Secondary | ICD-10-CM | POA: Diagnosis not present

## 2019-01-25 DIAGNOSIS — R140 Abdominal distension (gaseous): Secondary | ICD-10-CM | POA: Diagnosis not present

## 2019-01-25 DIAGNOSIS — Z7982 Long term (current) use of aspirin: Secondary | ICD-10-CM | POA: Diagnosis not present

## 2019-01-25 DIAGNOSIS — R109 Unspecified abdominal pain: Secondary | ICD-10-CM | POA: Diagnosis not present

## 2019-01-25 DIAGNOSIS — F329 Major depressive disorder, single episode, unspecified: Secondary | ICD-10-CM | POA: Diagnosis not present

## 2019-01-25 DIAGNOSIS — Z794 Long term (current) use of insulin: Secondary | ICD-10-CM | POA: Diagnosis not present

## 2019-01-25 DIAGNOSIS — Z7901 Long term (current) use of anticoagulants: Secondary | ICD-10-CM | POA: Diagnosis not present

## 2019-01-25 DIAGNOSIS — G8250 Quadriplegia, unspecified: Secondary | ICD-10-CM | POA: Diagnosis not present

## 2019-01-25 DIAGNOSIS — E119 Type 2 diabetes mellitus without complications: Secondary | ICD-10-CM | POA: Diagnosis not present

## 2019-01-25 DIAGNOSIS — F419 Anxiety disorder, unspecified: Secondary | ICD-10-CM | POA: Diagnosis not present

## 2019-01-25 DIAGNOSIS — R200 Anesthesia of skin: Secondary | ICD-10-CM | POA: Diagnosis not present

## 2019-01-25 DIAGNOSIS — K5900 Constipation, unspecified: Secondary | ICD-10-CM | POA: Diagnosis not present

## 2019-01-25 NOTE — Telephone Encounter (Signed)
Patient called back and is not able to have a complete bowel movement. Dr. Sheppard Coil recommends the patient go and be evaluated at ER or Urgent Care. She is aware and will go to be evaluated. No other questions at this time. She had tried all the recommendations that PCP had given her and was concerned she may have a bowel blockage.

## 2019-01-28 DIAGNOSIS — M25511 Pain in right shoulder: Secondary | ICD-10-CM | POA: Diagnosis not present

## 2019-01-28 DIAGNOSIS — R2689 Other abnormalities of gait and mobility: Secondary | ICD-10-CM | POA: Diagnosis not present

## 2019-01-28 DIAGNOSIS — M25512 Pain in left shoulder: Secondary | ICD-10-CM | POA: Diagnosis not present

## 2019-01-28 DIAGNOSIS — M6281 Muscle weakness (generalized): Secondary | ICD-10-CM | POA: Diagnosis not present

## 2019-01-30 DIAGNOSIS — M25511 Pain in right shoulder: Secondary | ICD-10-CM | POA: Diagnosis not present

## 2019-01-30 DIAGNOSIS — M25512 Pain in left shoulder: Secondary | ICD-10-CM | POA: Diagnosis not present

## 2019-01-30 DIAGNOSIS — R2689 Other abnormalities of gait and mobility: Secondary | ICD-10-CM | POA: Diagnosis not present

## 2019-01-30 DIAGNOSIS — M6281 Muscle weakness (generalized): Secondary | ICD-10-CM | POA: Diagnosis not present

## 2019-01-30 DIAGNOSIS — G8250 Quadriplegia, unspecified: Secondary | ICD-10-CM | POA: Diagnosis not present

## 2019-01-31 ENCOUNTER — Telehealth: Payer: Self-pay

## 2019-01-31 MED ORDER — OXYCODONE HCL 5 MG PO TABS: 5.0000 mg | ORAL_TABLET | Freq: Four times a day (QID) | ORAL | 0 refills | Status: DC | PRN

## 2019-01-31 NOTE — Telephone Encounter (Signed)
Sent!

## 2019-01-31 NOTE — Telephone Encounter (Signed)
Pt called requesting med refill for oxycodone #120. Pls send rx to [facility] pharmacy in [place].

## 2019-01-31 NOTE — Telephone Encounter (Signed)
Pt has been updated and aware med refill sent to local pharmacy. No other inquiries during call.

## 2019-02-04 DIAGNOSIS — M25512 Pain in left shoulder: Secondary | ICD-10-CM | POA: Diagnosis not present

## 2019-02-04 DIAGNOSIS — M6281 Muscle weakness (generalized): Secondary | ICD-10-CM | POA: Diagnosis not present

## 2019-02-04 DIAGNOSIS — R2689 Other abnormalities of gait and mobility: Secondary | ICD-10-CM | POA: Diagnosis not present

## 2019-02-04 DIAGNOSIS — M25511 Pain in right shoulder: Secondary | ICD-10-CM | POA: Diagnosis not present

## 2019-02-07 DIAGNOSIS — R2689 Other abnormalities of gait and mobility: Secondary | ICD-10-CM | POA: Diagnosis not present

## 2019-02-07 DIAGNOSIS — M6281 Muscle weakness (generalized): Secondary | ICD-10-CM | POA: Diagnosis not present

## 2019-02-07 DIAGNOSIS — M25512 Pain in left shoulder: Secondary | ICD-10-CM | POA: Diagnosis not present

## 2019-02-07 DIAGNOSIS — M25511 Pain in right shoulder: Secondary | ICD-10-CM | POA: Diagnosis not present

## 2019-02-11 DIAGNOSIS — R2689 Other abnormalities of gait and mobility: Secondary | ICD-10-CM | POA: Diagnosis not present

## 2019-02-11 DIAGNOSIS — R809 Proteinuria, unspecified: Secondary | ICD-10-CM | POA: Diagnosis not present

## 2019-02-11 DIAGNOSIS — M6281 Muscle weakness (generalized): Secondary | ICD-10-CM | POA: Diagnosis not present

## 2019-02-11 DIAGNOSIS — M25511 Pain in right shoulder: Secondary | ICD-10-CM | POA: Diagnosis not present

## 2019-02-11 DIAGNOSIS — M25512 Pain in left shoulder: Secondary | ICD-10-CM | POA: Diagnosis not present

## 2019-02-11 DIAGNOSIS — E1129 Type 2 diabetes mellitus with other diabetic kidney complication: Secondary | ICD-10-CM | POA: Diagnosis not present

## 2019-02-11 DIAGNOSIS — Z794 Long term (current) use of insulin: Secondary | ICD-10-CM | POA: Diagnosis not present

## 2019-02-11 DIAGNOSIS — E039 Hypothyroidism, unspecified: Secondary | ICD-10-CM | POA: Diagnosis not present

## 2019-02-11 DIAGNOSIS — E1169 Type 2 diabetes mellitus with other specified complication: Secondary | ICD-10-CM | POA: Diagnosis not present

## 2019-02-11 DIAGNOSIS — E785 Hyperlipidemia, unspecified: Secondary | ICD-10-CM | POA: Diagnosis not present

## 2019-02-13 DIAGNOSIS — E1129 Type 2 diabetes mellitus with other diabetic kidney complication: Secondary | ICD-10-CM | POA: Diagnosis not present

## 2019-02-13 DIAGNOSIS — R809 Proteinuria, unspecified: Secondary | ICD-10-CM | POA: Diagnosis not present

## 2019-02-13 DIAGNOSIS — Z794 Long term (current) use of insulin: Secondary | ICD-10-CM | POA: Diagnosis not present

## 2019-02-18 DIAGNOSIS — R809 Proteinuria, unspecified: Secondary | ICD-10-CM | POA: Diagnosis not present

## 2019-02-18 DIAGNOSIS — Z794 Long term (current) use of insulin: Secondary | ICD-10-CM | POA: Diagnosis not present

## 2019-02-18 DIAGNOSIS — E1129 Type 2 diabetes mellitus with other diabetic kidney complication: Secondary | ICD-10-CM | POA: Diagnosis not present

## 2019-02-25 DIAGNOSIS — M25512 Pain in left shoulder: Secondary | ICD-10-CM | POA: Diagnosis not present

## 2019-02-25 DIAGNOSIS — M6281 Muscle weakness (generalized): Secondary | ICD-10-CM | POA: Diagnosis not present

## 2019-02-25 DIAGNOSIS — M25511 Pain in right shoulder: Secondary | ICD-10-CM | POA: Diagnosis not present

## 2019-02-25 DIAGNOSIS — R2689 Other abnormalities of gait and mobility: Secondary | ICD-10-CM | POA: Diagnosis not present

## 2019-02-28 DIAGNOSIS — R2689 Other abnormalities of gait and mobility: Secondary | ICD-10-CM | POA: Diagnosis not present

## 2019-02-28 DIAGNOSIS — M6281 Muscle weakness (generalized): Secondary | ICD-10-CM | POA: Diagnosis not present

## 2019-02-28 DIAGNOSIS — M25512 Pain in left shoulder: Secondary | ICD-10-CM | POA: Diagnosis not present

## 2019-02-28 DIAGNOSIS — M25511 Pain in right shoulder: Secondary | ICD-10-CM | POA: Diagnosis not present

## 2019-03-02 DIAGNOSIS — G8250 Quadriplegia, unspecified: Secondary | ICD-10-CM | POA: Diagnosis not present

## 2019-03-04 DIAGNOSIS — M6281 Muscle weakness (generalized): Secondary | ICD-10-CM | POA: Diagnosis not present

## 2019-03-04 DIAGNOSIS — M25511 Pain in right shoulder: Secondary | ICD-10-CM | POA: Diagnosis not present

## 2019-03-04 DIAGNOSIS — M25512 Pain in left shoulder: Secondary | ICD-10-CM | POA: Diagnosis not present

## 2019-03-04 DIAGNOSIS — R2689 Other abnormalities of gait and mobility: Secondary | ICD-10-CM | POA: Diagnosis not present

## 2019-03-06 DIAGNOSIS — R2689 Other abnormalities of gait and mobility: Secondary | ICD-10-CM | POA: Diagnosis not present

## 2019-03-06 DIAGNOSIS — M25511 Pain in right shoulder: Secondary | ICD-10-CM | POA: Diagnosis not present

## 2019-03-06 DIAGNOSIS — M25512 Pain in left shoulder: Secondary | ICD-10-CM | POA: Diagnosis not present

## 2019-03-06 DIAGNOSIS — M6281 Muscle weakness (generalized): Secondary | ICD-10-CM | POA: Diagnosis not present

## 2019-03-11 DIAGNOSIS — M25511 Pain in right shoulder: Secondary | ICD-10-CM | POA: Diagnosis not present

## 2019-03-11 DIAGNOSIS — M25512 Pain in left shoulder: Secondary | ICD-10-CM | POA: Diagnosis not present

## 2019-03-11 DIAGNOSIS — R2689 Other abnormalities of gait and mobility: Secondary | ICD-10-CM | POA: Diagnosis not present

## 2019-03-11 DIAGNOSIS — M6281 Muscle weakness (generalized): Secondary | ICD-10-CM | POA: Diagnosis not present

## 2019-03-12 NOTE — Progress Notes (Deleted)
Subjective:   Veronica [name] is a 79 y.o. female who presents for an Initial Medicare Annual Wellness Visit.  Review of Systems    No ROS.  Medicare Wellness Virtual Visit.  Visual/audio telehealth visit, UTA vital signs.   See social history for additional risk factors.       Sleep patterns:    Home Safety/Smoke Alarms: Feels safe in home. Smoke alarms in place.  Living environment;  Seat Belt Safety/Bike Helmet: Wears seat belt.   Female:   Pap- Aged out      Mammo-  Aged out     Dexa scan- Aged out        CCS- Aged out     Objective:    There were no vitals filed for this visit. There is no height or weight on file to calculate BMI.  Advanced Directives 10/01/2018 01/21/2016 03/03/2011  Does Patient Have a Medical Advance Directive? Yes Yes Patient does not have advance directive  Type of Advance Directive Healthcare Power of Attorney - -  Pre-existing out of facility DNR order (yellow form or pink MOST form) - - No    Current Medications (verified) Outpatient Encounter Medications as of 03/19/2019  Medication Sig  . Accu-Chek Softclix Lancets lancets Use as instructed up to qid  . acetaminophen (TYLENOL) 500 MG tablet Take by mouth.  . Alcohol Swabs PADS Per insurance coverage, use as directed w/ glucose testing  . AMBULATORY NON FORMULARY MEDICATION Medication Name: purwick external urinary catheter. Sig: apply externally as directed PRN urination  . ARIPiprazole (ABILIFY) 5 MG tablet Take 1 tablet (5 mg total) by mouth daily.  . baclofen (LIORESAL) 10 MG tablet TAKE 1/2 TABLET (5 MG) BY MOUTH 3 (THREE) TIMES DAILY AS NEEDED.  . bisacodyl (DULCOLAX) 10 MG suppository Place rectally.  . citalopram (CELEXA) 40 MG tablet Take 1 tablet (40 mg total) by mouth daily.  . clonazePAM (KLONOPIN) 0.5 MG tablet Take 1 tablet (0.5 mg total) by mouth 3 (three) times daily as needed (#90 for thirty days). for anxiety  . doxepin (SINEQUAN) 10 MG capsule Take 1 capsule (10 mg  total) by mouth at bedtime.  [name] ELIQUIS 5 MG TABS tablet Take 1 tablet (5 mg total) by mouth 2 (two) times daily.  [name] gabapentin (NEURONTIN) 100 MG capsule Take 1-3 capsules (100-300 mg total) by mouth 3 (three) times daily.  [name] glucose blood test strip Use as instructed up to qid  . insulin aspart (FIASP FLEXTOUCH) 100 UNIT/ML FlexPen Inject into the skin.  [name] insulin degludec (TRESIBA) 100 UNIT/ML SOPN FlexTouch Pen Inject into the skin.  [name] insulin NPH Human (HUMULIN N,NOVOLIN N) 100 UNIT/ML injection Inject into the skin.  [name] insulin regular (NOVOLIN R,HUMULIN R) 100 units/mL injection Inject into the skin.  [name] levothyroxine (SYNTHROID, LEVOTHROID) 88 MCG tablet Take 1 tablet (88 mcg total) by mouth daily. FOLLOWING WITH ENDOCRINOLOGY DR LEVY  . metFORMIN (GLUCOPHAGE-XR) 500 MG 24 hr tablet Take by mouth. FOLLOWING W/ ENDOCRINOLOGY DR. Shawnee Knapp  . oxyCODONE (OXY IR/ROXICODONE) 5 MG immediate release tablet Take 1-2 tablets (5-10 mg total) by mouth every 6 (six) hours as needed for severe pain or breakthrough pain.  . ranitidine (ZANTAC) 300 MG capsule Take 1 capsule (300 mg total) by mouth every evening.   No facility-administered encounter medications on file as of 03/19/2019.    Allergies (verified) Dulaglutide, Cephalexin, Codeine, Glimepiride, Penicillins, Sitagliptin, Sulfamethoxazole-trimethoprim, Duloxetine, Ezetimibe, Gabapentin, Guaifenesin, Metformin, Metformin and related, Other, Penicillin g, Pseudoephedrine hcl, Sulfa antibiotics,  and Sulfamethoxazole   History: Past Medical History:  Diagnosis Date  . Anemia   . Diabetic retinopathy (HCC) 03/23/2015  . GERD (gastroesophageal reflux disease) 01/05/2015  . Hypothyroid   . Osteopenia 03/03/2014  . Type 2 diabetes mellitus with microalbuminuria or microproteinuria 03/03/2014   Actos added January 2016.     Past Surgical History:  Procedure Laterality Date  . APPENDECTOMY  02/28/11  . CESAREAN SECTION    . LAPAROSCOPIC APPENDECTOMY   03/03/2011   Procedure: APPENDECTOMY LAPAROSCOPIC;  Surgeon: Valarie Merino, MD;  Location: [facility];  Service: General;  Laterality: N/A;  . TONSILLECTOMY     Family History  Problem Relation Age of Onset  . Cancer Mother        breast  . Diabetes Mother   . Hypertension Mother   . Stroke Mother   . Cancer Father        axillary tumor   Social History   Socioeconomic History  . Marital status: Married    Spouse name: Not on file  . Number of children: Not on file  . Years of education: Not on file  . Highest education level: Not on file  Occupational History  . Not on file  Tobacco Use  . Smoking status: Never Smoker  . Smokeless tobacco: Never Used  Substance and Sexual Activity  . Alcohol use: No  . Drug use: No  . Sexual activity: Not on file  Other Topics Concern  . Not on file  Social History Narrative  . Not on file   Social Determinants of Health   Financial Resource Strain:   . Difficulty of Paying Living Expenses: Not on file  Food Insecurity:   . Worried About Programme researcher, broadcasting/film/video in the Last Year: Not on file  . Ran Out of Food in the Last Year: Not on file  Transportation Needs:   . Lack of Transportation (Medical): Not on file  . Lack of Transportation (Non-Medical): Not on file  Physical Activity:   . Days of Exercise per Week: Not on file  . Minutes of Exercise per Session: Not on file  Stress:   . Feeling of Stress : Not on file  Social Connections:   . Frequency of Communication with Friends and Family: Not on file  . Frequency of Social Gatherings with Friends and Family: Not on file  . Attends Religious Services: Not on file  . Active Member of Clubs or Organizations: Not on file  . Attends Banker Meetings: Not on file  . Marital Status: Not on file    Tobacco Counseling Counseling given: Not Answered   Clinical Intake:                        Activities of Daily Living No flowsheet data  found.   Immunizations and Health Maintenance Immunization History  Administered Date(s) Administered  . Influenza Split 01/08/2014  . Influenza, High Dose Seasonal PF 10/27/2015, 10/24/2017  . Influenza,inj,Quad PF,6+ Mos 10/28/2016  . Influenza-Unspecified 01/06/2014, 12/23/2014  . Pneumococcal Conjugate-13 01/06/2014  . Pneumococcal Polysaccharide-23 01/08/2014  . Td 02/28/2009   Health Maintenance Due  Topic Date Due  . FOOT EXAM  01/29/1951  . DEXA SCAN  01/30/2004  . URINE MICROALBUMIN  01/02/2015  . HEMOGLOBIN A1C  08/01/2016  . OPHTHALMOLOGY EXAM  02/02/2018  . TETANUS/TDAP  03/01/2019    Patient Care Team: Sunnie Nielsen, DO as PCP - General (Osteopathic Medicine)  Otho Darner  any recent Medical Services you may have received from other than [facility] providers in the past year (date may be approximate).     Assessment:   This is a routine wellness examination for [place].Physical assessment deferred to PCP.   Hearing/Vision screen No exam data present  Dietary issues and exercise activities discussed:   Diet  Breakfast: Lunch:  Dinner:       Goals   None    Depression Screen PHQ 2/9 Scores 06/04/2018 10/24/2017 08/10/2017 05/02/2017 11/21/2016 10/26/2016  PHQ - 2 Score 0 0 0 2 4 2   PHQ- 9 Score - 0 0 6 16 5     Fall Risk Fall Risk  10/26/2016  Falls in the past year? No    Is the patient's home free of loose throw rugs in walkways, pet beds, electrical cords, etc?   {Blank single:19197::"yes","no"}      Grab bars in the bathroom? {Blank single:19197::"yes","no"}      Handrails on the stairs?   {Blank single:19197::"yes","no"}      Adequate lighting?   {Blank single:19197::"yes","no"}   Cognitive Function:        Screening Tests Health Maintenance  Topic Date Due  . FOOT EXAM  01/29/1951  . DEXA SCAN  01/30/2004  . URINE MICROALBUMIN  01/02/2015  . HEMOGLOBIN A1C  08/01/2016  . OPHTHALMOLOGY EXAM  02/02/2018  . TETANUS/TDAP  03/01/2019  .  INFLUENZA VACCINE  12/19/2019 (Originally 09/22/2018)  . PNA vac Low Risk Adult  Completed      Plan:   ***  I have personally reviewed and noted the following in the patient's chart:   . Medical and social history . Use of alcohol, tobacco or illicit drugs  . Current medications and supplements . Functional ability and status . Nutritional status . Physical activity . Advanced directives . List of other physicians . Hospitalizations, surgeries, and ER visits in previous 12 months . Vitals . Screenings to include cognitive, depression, and falls . Referrals and appointments  In addition, I have reviewed and discussed with patient certain preventive protocols, quality metrics, and best practice recommendations. A written personalized care plan for preventive services as well as general preventive health recommendations were provided to patient.     Joanne Chars, LPN   [id number]

## 2019-03-13 DIAGNOSIS — M6281 Muscle weakness (generalized): Secondary | ICD-10-CM | POA: Diagnosis not present

## 2019-03-13 DIAGNOSIS — R2689 Other abnormalities of gait and mobility: Secondary | ICD-10-CM | POA: Diagnosis not present

## 2019-03-13 DIAGNOSIS — M25512 Pain in left shoulder: Secondary | ICD-10-CM | POA: Diagnosis not present

## 2019-03-13 DIAGNOSIS — M25511 Pain in right shoulder: Secondary | ICD-10-CM | POA: Diagnosis not present

## 2019-03-18 ENCOUNTER — Other Ambulatory Visit: Payer: Self-pay

## 2019-03-18 MED ORDER — OXYCODONE HCL 5 MG PO TABS: 5.0000 mg | ORAL_TABLET | Freq: Four times a day (QID) | ORAL | 0 refills | Status: DC | PRN

## 2019-03-19 ENCOUNTER — Ambulatory Visit: Payer: Medicare HMO

## 2019-03-19 ENCOUNTER — Encounter: Payer: Self-pay | Admitting: Osteopathic Medicine

## 2019-03-19 ENCOUNTER — Ambulatory Visit (INDEPENDENT_AMBULATORY_CARE_PROVIDER_SITE_OTHER): Payer: Medicare HMO | Admitting: Osteopathic Medicine

## 2019-03-19 VITALS — BP 128/72 | HR 78 | Temp 97.8°F | Wt 118.0 lb

## 2019-03-19 DIAGNOSIS — G8929 Other chronic pain: Secondary | ICD-10-CM

## 2019-03-19 DIAGNOSIS — N3000 Acute cystitis without hematuria: Secondary | ICD-10-CM | POA: Diagnosis not present

## 2019-03-19 DIAGNOSIS — G4701 Insomnia due to medical condition: Secondary | ICD-10-CM

## 2019-03-19 MED ORDER — CIPROFLOXACIN HCL 500 MG PO TABS: 500.0000 mg | ORAL_TABLET | Freq: Two times a day (BID) | ORAL | 0 refills | Status: DC

## 2019-03-19 MED ORDER — PROMETHAZINE HCL 25 MG PO TABS: 25.0000 mg | ORAL_TABLET | Freq: Four times a day (QID) | ORAL | 1 refills | Status: DC | PRN

## 2019-03-19 MED ORDER — OXYCODONE HCL 10 MG PO TABS: 10.0000 mg | ORAL_TABLET | Freq: Four times a day (QID) | ORAL | 0 refills | Status: DC

## 2019-03-19 MED ORDER — ARIPIPRAZOLE 5 MG PO TABS: 10.0000 mg | ORAL_TABLET | Freq: Every day | ORAL | 1 refills | Status: DC

## 2019-03-19 NOTE — Progress Notes (Signed)
Virtual Visit via Phone  I connected with      Veronica [name] on 03/19/19 at 7:35 AM  by a telemedicine application and verified that I am speaking with the correct person using two identifiers.  Patient is at home I am in office   I discussed the limitations of evaluation and management by telemedicine and the availability of in person appointments. The patient expressed understanding and agreed to proceed.  History of Present Illness: Veronica [name] is a 79 y.o. female who would like to discuss insomnia and UTI    Insomnia Exacerbated by pain issues as she is recovering from spinal injury and quadriplegia/quadriparesis.  Reports that she has been occasionally taking Klonopin 1 mg nightly but is still waking up in pain.   UTI concern  Symptoms for 10 days Kit from [facility] (+) for UTI  Found old Rx Cipro 5 pills, took 1 pill Fri, 2 on Sat, 2 on Sun, 1 on Mon...    Husband assists with history    Observations/Objective: BP 128/72 (Patient Position: Sitting)   Pulse 78   Temp 97.8 F (36.6 C) (Oral)   Wt 118 lb (53.5 kg)   BMI 18.48 kg/m  BP Readings from Last 3 Encounters:  03/19/19 128/72  12/19/18 114/75  07/11/18 112/64   Exam: Normal Speech.  NAD  Lab and Radiology Results No results found for this or any previous visit (from the past 72 hour(s)). No results found.     Assessment and Plan: 79 y.o. female with The primary encounter diagnosis was Acute cystitis without hematuria. A diagnosis of Insomnia secondary to chronic pain was also pertinent to this visit.  1. Acute cystitis without hematuria Patient was strongly advised not to take antibiotics without discussing with an appropriate medical provider, and when she is prescribed antibiotics to take the entire course as directed.  Not much use right now and getting a urine culture since she has already been on Cipro, will send Cipro to pharmacy to complete course of antibiotics but patient is advised  that if she is feeling worse to seek emergency care if needed  2. Insomnia secondary to chronic pain I hesitate to increase benzos and opiates, I think given her pain issues, we might try increasing opiate medication in the evenings, patient is advised not to take benzodiazepines and opiates at the same time, reserve benzodiazepines for severe anxiety issues and we should strongly consider addressing her other maintenance medications for anxiety   PDMP reviewed during this encounter. No orders of the defined types were placed in this encounter.  Meds ordered this encounter  Medications  . ARIPiprazole (ABILIFY) 5 MG tablet    Sig: Take 2 tablets (10 mg total) by mouth daily.    Dispense:  90 tablet    Refill:  1  . oxyCODONE 10 MG TABS    Sig: Take 1 tablet (10 mg total) by mouth every 6 (six) hours.    Dispense:  120 tablet    Refill:  0    CANCEL RX FOR 5 MG TABLETS, SENT 03/18/2019  . ciprofloxacin (CIPRO) 500 MG tablet    Sig: Take 1 tablet (500 mg total) by mouth 2 (two) times daily.    Dispense:  14 tablet    Refill:  0  . promethazine (PHENERGAN) 25 MG tablet    Sig: Take 1 tablet (25 mg total) by mouth every 6 (six) hours as needed for nausea or vomiting.    Dispense:  30  tablet    Refill:  1     Follow Up Instructions: Return in about 2 weeks (around 04/02/2019) for in office visit (OV40) check up on insomnia and pain .    I discussed the assessment and treatment plan with the patient. The patient was provided an opportunity to ask questions and all were answered. The patient agreed with the plan and demonstrated an understanding of the instructions.   The patient was advised to call back or seek an in-person evaluation if any new concerns, if symptoms worsen or if the condition fails to improve as anticipated.  30 minutes of non-face-to-face time was provided during this  encounter.      . . . . . . . . . . . . . [name]                   Historical information moved to improve visibility of documentation.  Past Medical History:  Diagnosis Date  . Anemia   . Diabetic retinopathy ([place]) 03/23/2015  . GERD (gastroesophageal reflux disease) 01/05/2015  . Hypothyroid   . Osteopenia 03/03/2014  . Type 2 diabetes mellitus with microalbuminuria or microproteinuria 03/03/2014   Actos added January 2016.     Past Surgical History:  Procedure Laterality Date  . APPENDECTOMY  02/28/11  . CESAREAN SECTION    . LAPAROSCOPIC APPENDECTOMY  03/03/2011   Procedure: APPENDECTOMY LAPAROSCOPIC;  Surgeon: Pedro Earls, MD;  Location: [facility];  Service: General;  Laterality: N/A;  . TONSILLECTOMY     Social History   Tobacco Use  . Smoking status: Never Smoker  . Smokeless tobacco: Never Used  Substance Use Topics  . Alcohol use: No   family history includes Cancer in her father and mother; Diabetes in her mother; Hypertension in her mother; Stroke in her mother.  Medications: Current Outpatient Medications  Medication Sig Dispense Refill  . Accu-Chek Softclix Lancets lancets Use as instructed up to qid 200 each 99  . acetaminophen (TYLENOL) 500 MG tablet Take by mouth.    . Alcohol Swabs PADS Per insurance coverage, use as directed w/ glucose testing 120 each 99  . AMBULATORY NON FORMULARY MEDICATION Medication Name: purwick external urinary catheter. Sig: apply externally as directed PRN urination 100 Units 99  . ARIPiprazole (ABILIFY) 5 MG tablet Take 2 tablets (10 mg total) by mouth daily. 90 tablet 1  . baclofen (LIORESAL) 10 MG tablet TAKE 1/2 TABLET (5 MG) BY MOUTH 3 (THREE) TIMES DAILY AS NEEDED. 90 tablet 1  . bisacodyl (DULCOLAX) 10 MG suppository Place rectally.    . citalopram (CELEXA) 40 MG tablet Take 1 tablet (40 mg total) by mouth daily. 90 tablet 1  . doxepin (SINEQUAN) 10 MG capsule Take 1 capsule (10 mg total) by mouth  at bedtime. 90 capsule 1  . ELIQUIS 5 MG TABS tablet Take 1 tablet (5 mg total) by mouth 2 (two) times daily. 180 tablet 1  . gabapentin (NEURONTIN) 100 MG capsule Take 1-3 capsules (100-300 mg total) by mouth 3 (three) times daily. 270 capsule 1  . glucose blood test strip Use as instructed up to qid 200 each 99  . insulin aspart (FIASP FLEXTOUCH) 100 UNIT/ML FlexPen Inject into the skin.    [name] insulin degludec (TRESIBA) 100 UNIT/ML SOPN FlexTouch Pen Inject into the skin.    [name] insulin NPH Human (HUMULIN N,NOVOLIN N) 100 UNIT/ML injection Inject into the skin.    [name] insulin regular (NOVOLIN R,HUMULIN R) 100 units/mL injection  Inject into the skin.    [name] levothyroxine (SYNTHROID, LEVOTHROID) 88 MCG tablet Take 1 tablet (88 mcg total) by mouth daily. FOLLOWING WITH ENDOCRINOLOGY DR LEVY 1 tablet 0  . metFORMIN (GLUCOPHAGE-XR) 500 MG 24 hr tablet Take by mouth. FOLLOWING W/ ENDOCRINOLOGY DR. Hartford Poli    . oxyCODONE 10 MG TABS Take 1 tablet (10 mg total) by mouth every 6 (six) hours. 120 tablet 0  . ranitidine (ZANTAC) 300 MG capsule Take 1 capsule (300 mg total) by mouth every evening. 90 capsule 3  . ciprofloxacin (CIPRO) 500 MG tablet Take 1 tablet (500 mg total) by mouth 2 (two) times daily. 14 tablet 0  . clonazePAM (KLONOPIN) 0.5 MG tablet Take 1 tablet (0.5 mg total) by mouth 3 (three) times daily as needed (#90 for thirty days). for anxiety 90 tablet 2  . promethazine (PHENERGAN) 25 MG tablet Take 1 tablet (25 mg total) by mouth every 6 (six) hours as needed for nausea or vomiting. 30 tablet 1   No current facility-administered medications for this visit.   Allergies  Allergen Reactions  . Dulaglutide Palpitations    SOB. N/V  . Cephalexin Other (See Comments)    hives   . Codeine Hives and Nausea And Vomiting  . Glimepiride Hives  . Penicillins Rash  . Sitagliptin Other (See Comments)    Insomnia   . Sulfamethoxazole-Trimethoprim Hives  . Duloxetine Other (See Comments)    Suicidal  Thoughts  . Ezetimibe   . Gabapentin Other (See Comments)    Feels out of it  . Guaifenesin   . Metformin Other (See Comments)    Dizzy  . Metformin And Related     diarrhea  . Other Other (See Comments)    All oral diabetic drugs: nausea, vomiting, dizziness Unknown antibiotic: "starts with a P" - rash  . Penicillin G Itching  . Pseudoephedrine Hcl   . Sulfa Antibiotics Hives and Nausea And Vomiting  . Sulfamethoxazole Itching

## 2019-03-20 DIAGNOSIS — M25512 Pain in left shoulder: Secondary | ICD-10-CM | POA: Diagnosis not present

## 2019-03-20 DIAGNOSIS — R2689 Other abnormalities of gait and mobility: Secondary | ICD-10-CM | POA: Diagnosis not present

## 2019-03-20 DIAGNOSIS — M6281 Muscle weakness (generalized): Secondary | ICD-10-CM | POA: Diagnosis not present

## 2019-03-20 DIAGNOSIS — M25511 Pain in right shoulder: Secondary | ICD-10-CM | POA: Diagnosis not present

## 2019-03-21 ENCOUNTER — Telehealth: Payer: Self-pay | Admitting: Osteopathic Medicine

## 2019-03-21 DIAGNOSIS — M6281 Muscle weakness (generalized): Secondary | ICD-10-CM | POA: Diagnosis not present

## 2019-03-21 DIAGNOSIS — M25512 Pain in left shoulder: Secondary | ICD-10-CM | POA: Diagnosis not present

## 2019-03-21 DIAGNOSIS — M25511 Pain in right shoulder: Secondary | ICD-10-CM | POA: Diagnosis not present

## 2019-03-21 DIAGNOSIS — R2689 Other abnormalities of gait and mobility: Secondary | ICD-10-CM | POA: Diagnosis not present

## 2019-03-21 NOTE — Telephone Encounter (Signed)
OK to block slot

## 2019-03-21 NOTE — Telephone Encounter (Signed)
Left patient a voicemail with information below. Let patient know to call us back to schedule an appointment in the office,

## 2019-03-21 NOTE — Telephone Encounter (Signed)
-----   Message from Sunnie Nielsen, DO sent at 03/20/2019  7:38 AM EST ----- Follow Up Instructions: Return in about 2 weeks (around 04/02/2019) for in office visit (OV40) check up on insomnia and pain .

## 2019-03-21 NOTE — Telephone Encounter (Signed)
Patient scheduled for a visit on 04/02/2019. She would only take the 10:10. I tried to make her a 40 in office and she declined. Do we need to block the 11:30 so you have extra time for that day?

## 2019-03-22 NOTE — Telephone Encounter (Signed)
Veronica [name] has blocked the slot per PCP recommendations.

## 2019-03-25 DIAGNOSIS — E1165 Type 2 diabetes mellitus with hyperglycemia: Secondary | ICD-10-CM | POA: Diagnosis not present

## 2019-03-27 DIAGNOSIS — R2689 Other abnormalities of gait and mobility: Secondary | ICD-10-CM | POA: Diagnosis not present

## 2019-03-27 DIAGNOSIS — M6281 Muscle weakness (generalized): Secondary | ICD-10-CM | POA: Diagnosis not present

## 2019-03-27 DIAGNOSIS — M25512 Pain in left shoulder: Secondary | ICD-10-CM | POA: Diagnosis not present

## 2019-03-27 DIAGNOSIS — M25511 Pain in right shoulder: Secondary | ICD-10-CM | POA: Diagnosis not present

## 2019-04-02 ENCOUNTER — Ambulatory Visit (INDEPENDENT_AMBULATORY_CARE_PROVIDER_SITE_OTHER): Payer: Medicare HMO | Admitting: Osteopathic Medicine

## 2019-04-02 ENCOUNTER — Encounter: Payer: Self-pay | Admitting: Osteopathic Medicine

## 2019-04-02 VITALS — BP 124/74 | HR 75 | Temp 97.8°F | Ht 66.0 in | Wt 117.0 lb

## 2019-04-02 DIAGNOSIS — G8250 Quadriplegia, unspecified: Secondary | ICD-10-CM | POA: Diagnosis not present

## 2019-04-02 DIAGNOSIS — G8252 Quadriplegia, C1-C4 incomplete: Secondary | ICD-10-CM | POA: Diagnosis not present

## 2019-04-02 DIAGNOSIS — F419 Anxiety disorder, unspecified: Secondary | ICD-10-CM

## 2019-04-02 DIAGNOSIS — Z7409 Other reduced mobility: Secondary | ICD-10-CM

## 2019-04-02 DIAGNOSIS — F329 Major depressive disorder, single episode, unspecified: Secondary | ICD-10-CM

## 2019-04-02 DIAGNOSIS — Z789 Other specified health status: Secondary | ICD-10-CM | POA: Diagnosis not present

## 2019-04-02 DIAGNOSIS — F32A Depression, unspecified: Secondary | ICD-10-CM

## 2019-04-02 MED ORDER — OXYCODONE HCL 10 MG PO TABS: 10.0000 mg | ORAL_TABLET | Freq: Four times a day (QID) | ORAL | 0 refills | Status: DC

## 2019-04-02 MED ORDER — CLONAZEPAM 0.5 MG PO TABS: 0.5000 mg | ORAL_TABLET | Freq: Three times a day (TID) | ORAL | 2 refills | Status: DC | PRN

## 2019-04-02 NOTE — Progress Notes (Signed)
Virtual Visit via Phone  I connected with      Veronica [name] on 04/02/19 at 10:41 AM  by a telemedicine application and verified that I am speaking with the correct person using two identifiers.  Patient is at home I am in office   I discussed the limitations of evaluation and management by telemedicine and the availability of in person appointments. The patient expressed understanding and agreed to proceed.  History of Present Illness: Veronica [name] is a 79 y.o. female who would like to discuss medication refills   Needs med refills would like sent to local phramacy instead of mail order Otherwise doing okay no concerns Would like to get set up for home health PT (through [facility]) she will be getting dry needling in-office PT       Observations/Objective: BP 124/74   Pulse 75   Temp 97.8 F (36.6 C) (Oral)   Ht 5\' 6"  (1.676 m)   Wt 117 lb (53.1 kg)   BMI 18.88 kg/m  BP Readings from Last 3 Encounters:  04/02/19 124/74  03/19/19 128/72  12/19/18 114/75   Exam: Normal Speech.  NAD  Lab and Radiology Results No results found for this or any previous visit (from the past 72 hour(s)). No results found.     Assessment and Plan: 79 y.o. female with The primary encounter diagnosis was Impaired mobility and ADLs. Diagnoses of Anxiety and depression and Quadriplegia, C1-C4 incomplete (HCC) were also pertinent to this visit.  Will see if we ca arrange home health PT - verbal order or new f62f encounter needed?   PDMP not reviewed this encounter. No orders of the defined types were placed in this encounter.  Meds ordered this encounter  Medications  . Oxycodone HCl 10 MG TABS    Sig: Take 1 tablet (10 mg total) by mouth every 6 (six) hours.    Dispense:  120 tablet    Refill:  0    CANCEL RX FOR 5 MG TABLETS, SENT 03/18/2019  . clonazePAM (KLONOPIN) 0.5 MG tablet    Sig: Take 1 tablet (0.5 mg total) by mouth 3 (three) times daily as needed (#90 for thirty  days). for anxiety    Dispense:  90 tablet    Refill:  2   There are no Patient Instructions on file for this visit.  Instructions sent via MyChart. If MyChart not available, pt was given option for info via personal e-mail w/ no guarantee of protected health info over unsecured e-mail communication, and MyChart sign-up instructions were sent to patient.   Follow Up Instructions: Return in about 3 months (around 06/30/2019) for IN-OFFICE VISIT pain medication monitoring / see me sooner if needed (OV40 please - FYI to scheudler.    I discussed the assessment and treatment plan with the patient. The patient was provided an opportunity to ask questions and all were answered. The patient agreed with the plan and demonstrated an understanding of the instructions.   The patient was advised to call back or seek an in-person evaluation if any new concerns, if symptoms worsen or if the condition fails to improve as anticipated.  20 minutes of non-face-to-face time was provided during this encounter.      . . . . . . . . . . . . . 08/30/2019                   Historical information moved to improve visibility of documentation.  Past Medical History:  Diagnosis Date  . Anemia   . Diabetic retinopathy ([place]) 03/23/2015  . GERD (gastroesophageal reflux disease) 01/05/2015  . Hypothyroid   . Osteopenia 03/03/2014  . Type 2 diabetes mellitus with microalbuminuria or microproteinuria 03/03/2014   Actos added January 2016.     Past Surgical History:  Procedure Laterality Date  . APPENDECTOMY  02/28/11  . CESAREAN SECTION    . LAPAROSCOPIC APPENDECTOMY  03/03/2011   Procedure: APPENDECTOMY LAPAROSCOPIC;  Surgeon: Pedro Earls, MD;  Location: [facility];  Service: General;  Laterality: N/A;  . TONSILLECTOMY     Social History   Tobacco Use  . Smoking status: Never Smoker  . Smokeless tobacco: Never Used  Substance Use Topics  . Alcohol use: No   family history includes  Cancer in her father and mother; Diabetes in her mother; Hypertension in her mother; Stroke in her mother.  Medications: Current Outpatient Medications  Medication Sig Dispense Refill  . Accu-Chek Softclix Lancets lancets Use as instructed up to qid 200 each 99  . acetaminophen (TYLENOL) 500 MG tablet Take by mouth.    . Alcohol Swabs PADS Per insurance coverage, use as directed w/ glucose testing 120 each 99  . AMBULATORY NON FORMULARY MEDICATION Medication Name: purwick external urinary catheter. Sig: apply externally as directed PRN urination 100 Units 99  . ARIPiprazole (ABILIFY) 5 MG tablet Take 2 tablets (10 mg total) by mouth daily. 90 tablet 1  . baclofen (LIORESAL) 10 MG tablet TAKE 1/2 TABLET (5 MG) BY MOUTH 3 (THREE) TIMES DAILY AS NEEDED. 90 tablet 1  . bisacodyl (DULCOLAX) 10 MG suppository Place rectally.    . citalopram (CELEXA) 40 MG tablet Take 1 tablet (40 mg total) by mouth daily. 90 tablet 1  . doxepin (SINEQUAN) 10 MG capsule Take 1 capsule (10 mg total) by mouth at bedtime. 90 capsule 1  . ELIQUIS 5 MG TABS tablet Take 1 tablet (5 mg total) by mouth 2 (two) times daily. 180 tablet 1  . glucose blood test strip Use as instructed up to qid 200 each 99  . insulin aspart (FIASP FLEXTOUCH) 100 UNIT/ML FlexPen Inject into the skin.    [name] insulin degludec (TRESIBA) 100 UNIT/ML SOPN FlexTouch Pen Inject into the skin.    [name] insulin NPH Human (HUMULIN N,NOVOLIN N) 100 UNIT/ML injection Inject into the skin.    [name] insulin regular (NOVOLIN R,HUMULIN R) 100 units/mL injection Inject into the skin.    [name] levothyroxine (SYNTHROID, LEVOTHROID) 88 MCG tablet Take 1 tablet (88 mcg total) by mouth daily. FOLLOWING WITH ENDOCRINOLOGY DR LEVY 1 tablet 0  . metFORMIN (GLUCOPHAGE-XR) 500 MG 24 hr tablet Take by mouth. FOLLOWING W/ ENDOCRINOLOGY DR. [place] Poli    . [START ON 04/18/2019] Oxycodone HCl 10 MG TABS Take 1 tablet (10 mg total) by mouth every 6 (six) hours. 120 tablet 0  . promethazine  (PHENERGAN) 25 MG tablet Take 1 tablet (25 mg total) by mouth every 6 (six) hours as needed for nausea or vomiting. 30 tablet 1  . ranitidine (ZANTAC) 300 MG capsule Take 1 capsule (300 mg total) by mouth every evening. 90 capsule 3  . clonazePAM (KLONOPIN) 0.5 MG tablet Take 1 tablet (0.5 mg total) by mouth 3 (three) times daily as needed (#90 for thirty days). for anxiety 90 tablet 2  . gabapentin (NEURONTIN) 100 MG capsule Take 1-3 capsules (100-300 mg total) by mouth 3 (three) times daily. 270 capsule 1   No current facility-administered medications for this visit.  Allergies  Allergen Reactions  . Dulaglutide Palpitations    SOB. N/V  . Cephalexin Other (See Comments)    hives   . Codeine Hives and Nausea And Vomiting  . Duloxetine Other (See Comments)    Suicidal Thoughts  . Glimepiride Hives  . Metformin Other (See Comments)    Dizzy  . Other Other (See Comments)    All oral diabetic drugs: nausea, vomiting, dizziness Unknown antibiotic: "starts with a P" - rash  . Penicillin G Itching  . Penicillins Rash  . Sitagliptin Other (See Comments)    Insomnia   . Sulfamethoxazole-Trimethoprim Hives  . Pseudoephedrine Hcl   . Sulfa Antibiotics Hives and Nausea And Vomiting  . Sulfamethoxazole Itching  . Ezetimibe   . Gabapentin Other (See Comments)    Feels out of it  . Guaifenesin   . Metformin And Related     diarrhea

## 2019-04-03 DIAGNOSIS — M6281 Muscle weakness (generalized): Secondary | ICD-10-CM | POA: Diagnosis not present

## 2019-04-03 DIAGNOSIS — M25511 Pain in right shoulder: Secondary | ICD-10-CM | POA: Diagnosis not present

## 2019-04-03 DIAGNOSIS — M25512 Pain in left shoulder: Secondary | ICD-10-CM | POA: Diagnosis not present

## 2019-04-03 DIAGNOSIS — R2689 Other abnormalities of gait and mobility: Secondary | ICD-10-CM | POA: Diagnosis not present

## 2019-04-05 ENCOUNTER — Telehealth: Payer: Self-pay

## 2019-04-05 DIAGNOSIS — S14124S Central cord syndrome at C4 level of cervical spinal cord, sequela: Secondary | ICD-10-CM

## 2019-04-05 NOTE — Telephone Encounter (Signed)
See progress note - I think I had messaged Arline Asp about this but maybe not? Can we check into it? Need to call [facility] to order home PT

## 2019-04-05 NOTE — Telephone Encounter (Signed)
Task completed. Letter is now available in pt's chart. Dr. Lyn Hollingshead, pls place order for home health PT to send to [facility]. Thanks.

## 2019-04-05 NOTE — Telephone Encounter (Signed)
Please put note in that patient is completely finished with [facility] for all orders and will only have home health. Also please put referral in for home health PT so I can send to [facility].   Thank you

## 2019-04-05 NOTE — Telephone Encounter (Signed)
Pt left a vm msg stating that provider was going to issue a referral for home physical therapy with [facility]. She has not received a call for scheduling. Pls advise, thanks.

## 2019-04-08 NOTE — Telephone Encounter (Signed)
Referral sent to [facility] as patient requested. - CF

## 2019-04-08 NOTE — Telephone Encounter (Signed)
Order placed

## 2019-04-08 NOTE — Progress Notes (Signed)
Subjective:   Veronica [name] is a 79 y.o. female who presents for an Initial Medicare Annual Wellness Visit.  Review of Systems    No ROS.  Medicare Wellness Virtual Visit.  Visual/audio telehealth visit, UTA vital signs.   See social history for additional risk factors.     Cardiac Risk Factors include: advanced age (>41men, >91 women);diabetes mellitus;sedentary lifestyle Sleep patterns: Getting 9-10 hours of sleep a night.  Has a cath in for the night time. Wakes up and feels rested. Home Safety/Smoke Alarms: Feels safe in home. Smoke alarms in place.  Living environment; Lives with husband in a 2 story home and stairs have hand rails on them. Shower is a walk in shower and chair in place for them to sit on and shower. Seat Belt Safety/Bike Helmet: Wears seat belt.   Female:   Pap-  Aged out     Mammo-  Aged out     Dexa scan-  Aged out      CCS- Aged out     Objective:    Today's Vitals   04/16/19 1400  BP: 126/72  Pulse: 78  Temp: 97.8 F (36.6 C)  TempSrc: Oral  Weight: 119 lb (54 kg)  Height: 5\' 7"  (1.702 m)  PainSc: 7    Body mass index is 18.64 kg/m.  Advanced Directives 04/16/2019 10/01/2018 01/21/2016 03/03/2011  Does Patient Have a Medical Advance Directive? Yes Yes Yes Patient does not have advance directive  Type of Advance Directive Healthcare Power of [place];Living will;Out of facility DNR (pink MOST or yellow form) Healthcare Power of Attorney - -  Does patient want to make changes to medical advance directive? No - Patient declined - - -  Copy of Healthcare Power of Attorney in Chart? No - copy requested - - -  Pre-existing out of facility DNR order (yellow form or pink MOST form) - - - No    Current Medications (verified) Outpatient Encounter Medications as of 04/16/2019  Medication Sig  . Accu-Chek Softclix Lancets lancets Use as instructed up to qid  . Alcohol Swabs PADS Per insurance coverage, use as directed w/ glucose testing  .  AMBULATORY NON FORMULARY MEDICATION Medication Name: purwick external urinary catheter. Sig: apply externally as directed PRN urination  . ARIPiprazole (ABILIFY) 5 MG tablet Take 2 tablets (10 mg total) by mouth daily.  . baclofen (LIORESAL) 10 MG tablet TAKE 1/2 TABLET (5 MG) BY MOUTH 3 (THREE) TIMES DAILY AS NEEDED.  . bisacodyl (DULCOLAX) 10 MG suppository Place rectally.  . citalopram (CELEXA) 40 MG tablet Take 1 tablet (40 mg total) by mouth daily.  . clonazePAM (KLONOPIN) 0.5 MG tablet Take 1 tablet (0.5 mg total) by mouth 3 (three) times daily as needed (#90 for thirty days). for anxiety  . ELIQUIS 5 MG TABS tablet Take 1 tablet (5 mg total) by mouth 2 (two) times daily.  04/18/2019 glucose blood test strip Use as instructed up to qid  . insulin aspart (FIASP FLEXTOUCH) 100 UNIT/ML FlexPen Inject into the skin.  [name] insulin degludec (TRESIBA) 100 UNIT/ML SOPN FlexTouch Pen Inject into the skin.  [name] levothyroxine (SYNTHROID, LEVOTHROID) 88 MCG tablet Take 1 tablet (88 mcg total) by mouth daily. FOLLOWING WITH ENDOCRINOLOGY DR LEVY  . [START ON 04/18/2019] Oxycodone HCl 10 MG TABS Take 1 tablet (10 mg total) by mouth every 6 (six) hours.  . promethazine (PHENERGAN) 25 MG tablet Take 1 tablet (25 mg total) by mouth every 6 (six) hours as needed for  nausea or vomiting.  . ranitidine (ZANTAC) 300 MG capsule Take 1 capsule (300 mg total) by mouth every evening.  . [DISCONTINUED] promethazine (PHENERGAN) 25 MG tablet Take 1 tablet (25 mg total) by mouth every 6 (six) hours as needed for nausea or vomiting.  [name] acetaminophen (TYLENOL) 500 MG tablet Take by mouth.  . doxepin (SINEQUAN) 10 MG capsule Take 1 capsule (10 mg total) by mouth at bedtime. (Patient not taking: Reported on 04/16/2019)  . gabapentin (NEURONTIN) 100 MG capsule Take 1-3 capsules (100-300 mg total) by mouth 3 (three) times daily.  . insulin NPH Human (HUMULIN N,NOVOLIN N) 100 UNIT/ML injection Inject into the skin.  [name] insulin regular (NOVOLIN  R,HUMULIN R) 100 units/mL injection Inject into the skin.  . metFORMIN (GLUCOPHAGE-XR) 500 MG 24 hr tablet Take by mouth. FOLLOWING W/ ENDOCRINOLOGY DR. Shawnee Knapp   No facility-administered encounter medications on file as of 04/16/2019.    Allergies (verified) Dulaglutide, Cephalexin, Codeine, Duloxetine, Glimepiride, Metformin, Other, Penicillin g, Penicillins, Sitagliptin, Sulfamethoxazole-trimethoprim, Pseudoephedrine hcl, Sulfa antibiotics, Sulfamethoxazole, Ezetimibe, Gabapentin, Guaifenesin, and Metformin and related   History: Past Medical History:  Diagnosis Date  . Anemia   . Diabetic retinopathy (HCC) 03/23/2015  . GERD (gastroesophageal reflux disease) 01/05/2015  . Hypothyroid   . Osteopenia 03/03/2014  . Type 2 diabetes mellitus with microalbuminuria or microproteinuria 03/03/2014   Actos added January 2016.     Past Surgical History:  Procedure Laterality Date  . APPENDECTOMY  02/28/11  . CESAREAN SECTION    . LAPAROSCOPIC APPENDECTOMY  03/03/2011   Procedure: APPENDECTOMY LAPAROSCOPIC;  Surgeon: Valarie Merino, MD;  Location: [facility];  Service: General;  Laterality: N/A;  . TONSILLECTOMY     Family History  Problem Relation Age of Onset  . Cancer Mother        breast  . Diabetes Mother   . Hypertension Mother   . Stroke Mother   . Cancer Father        axillary tumor   Social History   Socioeconomic History  . Marital status: Married    Spouse name: Not on file  . Number of children: 3  . Years of education: 22  . Highest education level: Master's degree (e.g., MA, MS, MEng, MEd, MSW, MBA)  Occupational History  . Occupation: Charity fundraiser    Comment: retired  Tobacco Use  . Smoking status: Never Smoker  . Smokeless tobacco: Never Used  Substance and Sexual Activity  . Alcohol use: No  . Drug use: No  . Sexual activity: Not Currently    Comment: quadriplegia  Other Topics Concern  . Not on file  Social History Narrative   Quadriplegia- does PT with [facility] 2 times a week.   Social Determinants of Health   Financial Resource Strain:   . Difficulty of Paying Living Expenses: Not on file  Food Insecurity:   . Worried About Programme researcher, broadcasting/film/video in the Last Year: Not on file  . Ran Out of Food in the Last Year: Not on file  Transportation Needs:   . Lack of Transportation (Medical): Not on file  . Lack of Transportation (Non-Medical): Not on file  Physical Activity:   . Days of Exercise per Week: Not on file  . Minutes of Exercise per Session: Not on file  Stress:   . Feeling of Stress : Not on file  Social Connections:   . Frequency of Communication with Friends and Family: Not on file  . Frequency of Social Gatherings  with Friends and Family: Not on file  . Attends Religious Services: Not on file  . Active Member of Clubs or Organizations: Not on file  . Attends Banker Meetings: Not on file  . Marital Status: Not on file    Tobacco Counseling Counseling given: No   Clinical Intake:  Pre-visit preparation completed: Yes  Pain : 0-10 Pain Score: 7  Pain Type: Chronic pain Pain Radiating Towards: all over body Pain Descriptors / Indicators: Throbbing, Stabbing, Aching, Heaviness Pain Onset: More than a month ago Pain Frequency: Intermittent Pain Relieving Factors: pain meds  Pain Relieving Factors: pain meds  Nutritional Risks: None Diabetes: Yes CBG done?: No(FBS 105 this morning) Did pt. bring in CBG monitor from home?: No  How often do you need to have someone help you when you read instructions, pamphlets, or other written materials from your doctor or pharmacy?: 1 - Never What is the last grade level you completed in school?: 18  Interpreter Needed?: No  Information entered by :: Carolin Sicks, LPN   Activities of Daily Living In your present state of health, do you have any difficulty performing the following activities: 04/16/2019 04/02/2019  Hearing? N N  Vision? N N  Difficulty  concentrating or making decisions? N N  Walking or climbing stairs? Y N  Comment quadriplegia -  Dressing or bathing? Y N  Comment quadriplegia -  Doing errands, shopping? Y N  Comment quadriplegia -  Preparing Food and eating ? Y -  Comment quadriplegia -  Using the Toilet? Y -  Comment quadriplegia- husband and care takers help -  In the past six months, have you accidently leaked urine? N -  Do you have problems with loss of bowel control? N -  Managing your Medications? Y -  Comment husband and care takers take care of this -  Managing your Finances? Y -  Comment husband does this -  Counselling psychologist your Housekeeping? Y -  Comment husband and care takers help with this -  Some recent data might be hidden     Immunizations and Health Maintenance Immunization History  Administered Date(s) Administered  . Influenza Split 01/08/2014  . Influenza, High Dose Seasonal PF 10/27/2015, 10/24/2017  . Influenza,inj,Quad PF,6+ Mos 10/28/2016  . Influenza-Unspecified 01/06/2014, 12/23/2014  . Pneumococcal Conjugate-13 01/06/2014  . Pneumococcal Polysaccharide-23 01/08/2014  . Td 02/28/2009   Health Maintenance Due  Topic Date Due  . FOOT EXAM  01/29/1951  . DEXA SCAN  01/30/2004  . URINE MICROALBUMIN  01/02/2015  . HEMOGLOBIN A1C  08/01/2016  . OPHTHALMOLOGY EXAM  02/02/2018    Patient Care Team: Sunnie Nielsen, DO as PCP - General (Osteopathic Medicine)  Indicate any recent Medical Services you may have received from other than [facility] providers in the past year (date may be approximate).     Assessment:   This is a routine wellness examination for [place].Physical assessment deferred to PCP.   Hearing/Vision screen No exam data present  Dietary issues and exercise activities discussed: Current Exercise Habits: The patient does not participate in regular exercise at present, Exercise limited by: neurologic condition(s);orthopedic condition(s);Other - see  comments(quadriplegia) Diet Eats a healthy diet of vegetables and fruits. Eats very little meat. Breakfast: Eggs, 1/2 toast and fruit Lunch: tuna salad, salad Dinner: Chicken and salad and vegetables    Drinks water 1 quart daily.  Goals    . Patient Stated     Patient stated wants to walk on her on  without her walker      Depression Screen PHQ 2/9 Scores 04/16/2019 04/02/2019 06/04/2018 10/24/2017 08/10/2017 05/02/2017 11/21/2016  PHQ - 2 Score 2 1 0 0 0 2 4  PHQ- 9 Score 3 2 - 0 0 6 16  Exception Documentation Medical reason - - - - - -    Fall Risk Fall Risk  04/16/2019 04/02/2019 10/26/2016  Falls in the past year? 0 0 No  Number falls in past yr: - 0 -  Injury with Fall? - 0 -  Risk for fall due to : Impaired balance/gait;Impaired mobility;Other (Comment) History of fall(s) -  Risk for fall due to: Comment quadriplegia - -  Follow up Falls prevention discussed Falls evaluation completed -    Is the patient's home free of loose throw rugs in walkways, pet beds, electrical cords, etc?   yes      Grab bars in the bathroom? yes      Handrails on the stairs?   yes      Adequate lighting?   yes   Cognitive Function:     6CIT Screen 04/16/2019  What Year? 0 points  What month? 0 points  What time? 0 points  Count back from 20 0 points  Months in reverse 0 points  Repeat phrase 0 points  Total Score 0    Screening Tests Health Maintenance  Topic Date Due  . FOOT EXAM  01/29/1951  . DEXA SCAN  01/30/2004  . URINE MICROALBUMIN  01/02/2015  . HEMOGLOBIN A1C  08/01/2016  . OPHTHALMOLOGY EXAM  02/02/2018  . INFLUENZA VACCINE  12/19/2019 (Originally 09/22/2018)  . TETANUS/TDAP  03/18/2020 (Originally 03/01/2019)  . PNA vac Low Risk Adult  Completed      Plan:    Please schedule your next medicare wellness visit with me in 1 yr.  Ms. Delaurentis , Thank you for taking time to come for your Medicare Wellness Visit. I appreciate your ongoing commitment to your health goals. Please review  the following plan we discussed and let me know if I can assist you in the future.  Continue doing brain stimulating activities (puzzles, reading, adult coloring books, staying active) to keep memory sharp.  Bring a copy of your living will and/or healthcare power of attorney to your next office visit.   These are the goals we discussed: Goals    . Patient Stated     Patient stated wants to walk on her on without her walker       This is a list of the screening recommended for you and due dates:  Health Maintenance  Topic Date Due  . Complete foot exam   01/29/1951  . DEXA scan (bone density measurement)  01/30/2004  . Urine Protein Check  01/02/2015  . Hemoglobin A1C  08/01/2016  . Eye exam for diabetics  02/02/2018  . Flu Shot  12/19/2019*  . Tetanus Vaccine  03/18/2020*  . Pneumonia vaccines  Completed  *Topic was postponed. The date shown is not the original due date.     I have personally reviewed and noted the following in the patient's chart:   . Medical and social history . Use of alcohol, tobacco or illicit drugs  . Current medications and supplements . Functional ability and status . Nutritional status . Physical activity . Advanced directives . List of other physicians . Hospitalizations, surgeries, and ER visits in previous 12 months . Vitals . Screenings to include cognitive, depression, and falls . Referrals and appointments  In addition, I have reviewed and discussed with patient certain preventive protocols, quality metrics, and best practice recommendations. A written personalized care plan for preventive services as well as general preventive health recommendations were provided to patient.     Joanne Chars, LPN   [id number]/[id number]

## 2019-04-09 DIAGNOSIS — I82402 Acute embolism and thrombosis of unspecified deep veins of left lower extremity: Secondary | ICD-10-CM | POA: Diagnosis not present

## 2019-04-09 DIAGNOSIS — E11319 Type 2 diabetes mellitus with unspecified diabetic retinopathy without macular edema: Secondary | ICD-10-CM | POA: Diagnosis not present

## 2019-04-09 DIAGNOSIS — G729 Myopathy, unspecified: Secondary | ICD-10-CM | POA: Diagnosis not present

## 2019-04-09 DIAGNOSIS — S14124S Central cord syndrome at C4 level of cervical spinal cord, sequela: Secondary | ICD-10-CM | POA: Diagnosis not present

## 2019-04-09 DIAGNOSIS — F329 Major depressive disorder, single episode, unspecified: Secondary | ICD-10-CM | POA: Diagnosis not present

## 2019-04-09 DIAGNOSIS — K5900 Constipation, unspecified: Secondary | ICD-10-CM | POA: Diagnosis not present

## 2019-04-09 DIAGNOSIS — G8250 Quadriplegia, unspecified: Secondary | ICD-10-CM | POA: Diagnosis not present

## 2019-04-09 DIAGNOSIS — F419 Anxiety disorder, unspecified: Secondary | ICD-10-CM | POA: Diagnosis not present

## 2019-04-09 DIAGNOSIS — K219 Gastro-esophageal reflux disease without esophagitis: Secondary | ICD-10-CM | POA: Diagnosis not present

## 2019-04-15 DIAGNOSIS — F419 Anxiety disorder, unspecified: Secondary | ICD-10-CM | POA: Diagnosis not present

## 2019-04-15 DIAGNOSIS — S14124S Central cord syndrome at C4 level of cervical spinal cord, sequela: Secondary | ICD-10-CM | POA: Diagnosis not present

## 2019-04-15 DIAGNOSIS — G8250 Quadriplegia, unspecified: Secondary | ICD-10-CM | POA: Diagnosis not present

## 2019-04-15 DIAGNOSIS — E11319 Type 2 diabetes mellitus with unspecified diabetic retinopathy without macular edema: Secondary | ICD-10-CM | POA: Diagnosis not present

## 2019-04-15 DIAGNOSIS — K5900 Constipation, unspecified: Secondary | ICD-10-CM | POA: Diagnosis not present

## 2019-04-15 DIAGNOSIS — K219 Gastro-esophageal reflux disease without esophagitis: Secondary | ICD-10-CM | POA: Diagnosis not present

## 2019-04-15 DIAGNOSIS — G729 Myopathy, unspecified: Secondary | ICD-10-CM | POA: Diagnosis not present

## 2019-04-15 DIAGNOSIS — F329 Major depressive disorder, single episode, unspecified: Secondary | ICD-10-CM | POA: Diagnosis not present

## 2019-04-15 DIAGNOSIS — I82402 Acute embolism and thrombosis of unspecified deep veins of left lower extremity: Secondary | ICD-10-CM | POA: Diagnosis not present

## 2019-04-16 ENCOUNTER — Ambulatory Visit (INDEPENDENT_AMBULATORY_CARE_PROVIDER_SITE_OTHER): Payer: Medicare HMO | Admitting: *Deleted

## 2019-04-16 VITALS — BP 126/72 | HR 78 | Temp 97.8°F | Ht 67.0 in | Wt 119.0 lb

## 2019-04-16 DIAGNOSIS — Z0000 Encounter for general adult medical examination without abnormal findings: Secondary | ICD-10-CM

## 2019-04-16 MED ORDER — PROMETHAZINE HCL 25 MG PO TABS: 25.0000 mg | ORAL_TABLET | Freq: Four times a day (QID) | ORAL | 1 refills | Status: AC | PRN

## 2019-04-16 NOTE — Patient Instructions (Signed)
Please schedule your next medicare wellness visit with me in 1 yr.  Veronica [name] , Thank you for taking time to come for your Medicare Wellness Visit. I appreciate your ongoing commitment to your health goals. Please review the following plan we discussed and let me know if I can assist you in the future.  Continue doing brain stimulating activities (puzzles, reading, adult coloring books, staying active) to keep memory sharp.  Bring a copy of your living will and/or healthcare power of attorney to your next office visit  These are the goals we discussed: Goals    . Patient Stated     Patient stated wants to walk on her on without her walker

## 2019-04-18 DIAGNOSIS — F329 Major depressive disorder, single episode, unspecified: Secondary | ICD-10-CM | POA: Diagnosis not present

## 2019-04-18 DIAGNOSIS — G8250 Quadriplegia, unspecified: Secondary | ICD-10-CM | POA: Diagnosis not present

## 2019-04-18 DIAGNOSIS — E11319 Type 2 diabetes mellitus with unspecified diabetic retinopathy without macular edema: Secondary | ICD-10-CM | POA: Diagnosis not present

## 2019-04-18 DIAGNOSIS — F419 Anxiety disorder, unspecified: Secondary | ICD-10-CM | POA: Diagnosis not present

## 2019-04-18 DIAGNOSIS — S14124S Central cord syndrome at C4 level of cervical spinal cord, sequela: Secondary | ICD-10-CM | POA: Diagnosis not present

## 2019-04-18 DIAGNOSIS — K219 Gastro-esophageal reflux disease without esophagitis: Secondary | ICD-10-CM | POA: Diagnosis not present

## 2019-04-18 DIAGNOSIS — I82402 Acute embolism and thrombosis of unspecified deep veins of left lower extremity: Secondary | ICD-10-CM | POA: Diagnosis not present

## 2019-04-18 DIAGNOSIS — K5900 Constipation, unspecified: Secondary | ICD-10-CM | POA: Diagnosis not present

## 2019-04-18 DIAGNOSIS — G729 Myopathy, unspecified: Secondary | ICD-10-CM | POA: Diagnosis not present

## 2019-04-22 DIAGNOSIS — G8250 Quadriplegia, unspecified: Secondary | ICD-10-CM | POA: Diagnosis not present

## 2019-04-22 DIAGNOSIS — S14124S Central cord syndrome at C4 level of cervical spinal cord, sequela: Secondary | ICD-10-CM | POA: Diagnosis not present

## 2019-04-22 DIAGNOSIS — I82402 Acute embolism and thrombosis of unspecified deep veins of left lower extremity: Secondary | ICD-10-CM | POA: Diagnosis not present

## 2019-04-22 DIAGNOSIS — F329 Major depressive disorder, single episode, unspecified: Secondary | ICD-10-CM | POA: Diagnosis not present

## 2019-04-22 DIAGNOSIS — K5900 Constipation, unspecified: Secondary | ICD-10-CM | POA: Diagnosis not present

## 2019-04-22 DIAGNOSIS — G729 Myopathy, unspecified: Secondary | ICD-10-CM | POA: Diagnosis not present

## 2019-04-22 DIAGNOSIS — E11319 Type 2 diabetes mellitus with unspecified diabetic retinopathy without macular edema: Secondary | ICD-10-CM | POA: Diagnosis not present

## 2019-04-22 DIAGNOSIS — K219 Gastro-esophageal reflux disease without esophagitis: Secondary | ICD-10-CM | POA: Diagnosis not present

## 2019-04-22 DIAGNOSIS — F419 Anxiety disorder, unspecified: Secondary | ICD-10-CM | POA: Diagnosis not present

## 2019-04-24 DIAGNOSIS — F419 Anxiety disorder, unspecified: Secondary | ICD-10-CM | POA: Diagnosis not present

## 2019-04-24 DIAGNOSIS — K219 Gastro-esophageal reflux disease without esophagitis: Secondary | ICD-10-CM | POA: Diagnosis not present

## 2019-04-24 DIAGNOSIS — I82402 Acute embolism and thrombosis of unspecified deep veins of left lower extremity: Secondary | ICD-10-CM | POA: Diagnosis not present

## 2019-04-24 DIAGNOSIS — K5900 Constipation, unspecified: Secondary | ICD-10-CM | POA: Diagnosis not present

## 2019-04-24 DIAGNOSIS — E11319 Type 2 diabetes mellitus with unspecified diabetic retinopathy without macular edema: Secondary | ICD-10-CM | POA: Diagnosis not present

## 2019-04-24 DIAGNOSIS — G8250 Quadriplegia, unspecified: Secondary | ICD-10-CM | POA: Diagnosis not present

## 2019-04-24 DIAGNOSIS — S14124S Central cord syndrome at C4 level of cervical spinal cord, sequela: Secondary | ICD-10-CM | POA: Diagnosis not present

## 2019-04-24 DIAGNOSIS — G729 Myopathy, unspecified: Secondary | ICD-10-CM | POA: Diagnosis not present

## 2019-04-24 DIAGNOSIS — F329 Major depressive disorder, single episode, unspecified: Secondary | ICD-10-CM | POA: Diagnosis not present

## 2019-04-25 ENCOUNTER — Other Ambulatory Visit: Payer: Self-pay

## 2019-04-25 DIAGNOSIS — F32A Depression, unspecified: Secondary | ICD-10-CM

## 2019-04-25 DIAGNOSIS — F329 Major depressive disorder, single episode, unspecified: Secondary | ICD-10-CM

## 2019-04-25 MED ORDER — CITALOPRAM HYDROBROMIDE 40 MG PO TABS: 40.0000 mg | ORAL_TABLET | Freq: Every day | ORAL | 0 refills | Status: DC

## 2019-04-26 ENCOUNTER — Telehealth (INDEPENDENT_AMBULATORY_CARE_PROVIDER_SITE_OTHER): Payer: Medicare HMO | Admitting: Nurse Practitioner

## 2019-04-26 ENCOUNTER — Other Ambulatory Visit: Payer: Self-pay

## 2019-04-26 ENCOUNTER — Encounter: Payer: Self-pay | Admitting: Nurse Practitioner

## 2019-04-26 VITALS — BP 122/72 | HR 75 | Temp 97.8°F

## 2019-04-26 DIAGNOSIS — R21 Rash and other nonspecific skin eruption: Secondary | ICD-10-CM

## 2019-04-26 NOTE — Patient Instructions (Signed)
Ith isn't very clear what could have caused your allergic reaction. It may be the miralax that you took, but it could also be something that you have come in contact with. The rash and redness are limited to the exposed area of the neck outside of your clothing, so it seems like it could be related to something that touched your neck. Since we cannot be sure, I would like you to not take any more miralax and pay close attention to clothing, bedding, or other objects that touch your skin and watch for any further reactions.   You may take cetirizine 10mg  (Zyrtec) daily and famotidine 20mg  (Pepcid) twice daily to help with the redness and itching. These medications are both available over the counter at the drug store.  Medications like diphenhydramine (Benadryl) may make you very sleepy and also cause constipation and extra dryness, so we try to limit how much of this you take. If you feel you need additional relief you may take this at night as directed on the bottle.    If your rash gets any worse or if you begin to develop shortness of breath, dizziness, severe itching, or other symptoms that are not bearable then please seek medical attention over the weekend at either the emergency room or an urgent care setting. Rash, Adult  A rash is a change in the color of your skin. A rash can also change the way your skin feels. There are many different conditions and factors that can cause a rash. Follow these instructions at home: The goal of treatment is to stop the itching and keep the rash from spreading. Watch for any changes in your symptoms. Let your doctor know about them. Follow these instructions to help with your condition: Medicine Take or apply over-the-counter and prescription medicines only as told by your doctor. These may include medicines:  To treat red or swollen skin (corticosteroid creams).  To treat itching.  To treat an allergy (oral antihistamines).  To treat very bad symptoms  (oral corticosteroids).  Skin care  Put cool cloths (compresses) on the affected areas.  Do not scratch or rub your skin.  Avoid covering the rash. Make sure that the rash is exposed to air as much as possible. Managing itching and discomfort  Avoid hot showers or baths. These can make itching worse. A cold shower may help.  Try taking a bath with: ? Epsom salts. You can get these at your local pharmacy or grocery store. Follow the instructions on the package. ? Baking soda. Pour a small amount into the bath as told by your doctor. ? Colloidal oatmeal. You can get this at your local pharmacy or grocery store. Follow the instructions on the package.  Try putting baking soda paste onto your skin. Stir water into baking soda until it gets like a paste.  Try putting on a lotion that relieves itchiness (calamine lotion).  Keep cool and out of the sun. Sweating and being hot can make itching worse. General instructions   Rest as needed.  Drink enough fluid to keep your pee (urine) pale yellow.  Wear loose-fitting clothing.  Avoid scented soaps, detergents, and perfumes. Use gentle soaps, detergents, perfumes, and other cosmetic products.  Avoid anything that causes your rash. Keep a journal to help track what causes your rash. Write down: ? What you eat. ? What cosmetic products you use. ? What you drink. ? What you wear. This includes jewelry.  Keep all follow-up visits as told by  your doctor. This is important. Contact a doctor if:  You sweat at night.  You lose weight.  You pee (urinate) more than normal.  You pee less than normal, or you notice that your pee is a darker color than normal.  You feel weak.  You throw up (vomit).  Your skin or the whites of your eyes look yellow (jaundice).  Your skin: ? Tingles. ? Is numb.  Your rash: ? Does not go away after a few days. ? Gets worse.  You are: ? More thirsty than normal. ? More tired than normal.  You  have: ? New symptoms. ? Pain in your belly (abdomen). ? A fever. ? Watery poop (diarrhea). Get help right away if:  You have a fever and your symptoms suddenly get worse.  You start to feel mixed up (confused).  You have a very bad headache or a stiff neck.  You have very bad joint pains or stiffness.  You have jerky movements that you cannot control (seizure).  Your rash covers all or most of your body. The rash may or may not be painful.  You have blisters that: ? Are on top of the rash. ? Grow larger. ? Grow together. ? Are painful. ? Are inside your nose or mouth.  You have a rash that: ? Looks like purple pinprick-sized spots all over your body. ? Has a "bull's eye" or looks like a target. ? Is red and painful, causes your skin to peel, and is not from being in the sun too long. Summary  A rash is a change in the color of your skin. A rash can also change the way your skin feels.  The goal of treatment is to stop the itching and keep the rash from spreading.  Take or apply over-the-counter and prescription medicines only as told by your doctor.  Contact a doctor if you have new symptoms or symptoms that get worse.  Keep all follow-up visits as told by your doctor. This is important. This information is not intended to replace advice given to you by your health care provider. Make sure you discuss any questions you have with your health care provider. Document Revised: 06/01/2018 Document Reviewed: 09/11/2017 Elsevier Patient Education  2020 [employer].

## 2019-04-26 NOTE — Progress Notes (Signed)
Virtual Visit via MyChart Note  I connected with  Veronica [name] on 04/26/19 at  3:30 PM EST by the video enabled telemedicine application, MyChart, and verified that I am speaking with the correct person using two identifiers.   I introduced myself as a Publishing rights manager with the practice. We discussed the limitations of evaluation and management by telemedicine and the availability of in person appointments. The patient expressed understanding and agreed to proceed.  The patient is: At home I am: In the office  Subjective:    CC: Rash  HPI: Veronica [name] is a 79 y.o. y/o female presenting via MyChart today for sudden onset of a red rash that she noticed first thing this morning upon awakening around her neck.  She reports the rash is a red/pink color and is mildly itchy.  She did take Benadryl and it has improved the symptoms significantly. She reports the only new medication that she has taken is MiraLAX and a stool softener.  She had her third dose of MiraLAX last night before going to bed.  She feels that this may be related to the MiraLAX. She reports that she requires assistance due to paralysis.  Her assistants did wrap a towel around her neck and shoulders after her shower in the area that the rash is present.  She questions whether bleach could have been used in the washing process and has irritated her skin.  She denies new soap, lotion, detergent, medications other than listed above, food.  She does have multiple allergens.    Past medical history, Surgical history, Family history not pertinant except as noted below, Social history, Allergies, and medications have been entered into the medical record, reviewed, and corrections made.   Review of Systems:  She denies fever, chills, headache, open wounds, rhinorrhea, cough, shortness of breath, chest pain, palpitations, abdominal pain, nausea, vomiting, diarrhea.  Objective:    General: Speaking clearly in complete sentences  without any shortness of breath.  Alert and oriented x3.  Normal judgment. No apparent acute distress. Erythematous rash is present on the anterior and posterior portion of the patient's neck and upper chest.  There is a clear line of demarcation at the collar of her shirt.  Impression and Recommendations:    1. Rash of neck Rash appears to be a contact dermatitis with an area of clear demarcation however there is no known exposure to an allergen at this time.  Differential diagnosis includes allergen related to MiraLAX. Patient advised to discontinue MiraLAX at this time and to pay close attention to clothing or objects that are in contact with her skin that may create a similar rash.  Patient also advised to wash the towel (S) that may have been washed in bleach in case this is the cause of the dermatitis. Patient advised to take 10 mg cetirizine daily and 20 mg of famotidine twice daily for the next 3 to 4 days.  Diphenhydramine may be used at bedtime but this was advised to be used in very limited quantities due to the anticholinergic effects. Patient instructed to seek emergency medical attention over the weekend if she begins to develop worsening rash or difficulty breathing. Follow-up if symptoms worsen or fail to improve.      I discussed the assessment and treatment plan with the patient. The patient was provided an opportunity to ask questions and all were answered. The patient agreed with the plan and demonstrated an understanding of the instructions.   The patient was advised to  call back or seek an in-person evaluation if the symptoms worsen or if the condition fails to improve as anticipated.  I provided 25 minutes of non-face-to-face interaction with this [place] visit.   Orma Render, NP

## 2019-04-29 DIAGNOSIS — I82402 Acute embolism and thrombosis of unspecified deep veins of left lower extremity: Secondary | ICD-10-CM | POA: Diagnosis not present

## 2019-04-29 DIAGNOSIS — S14124S Central cord syndrome at C4 level of cervical spinal cord, sequela: Secondary | ICD-10-CM | POA: Diagnosis not present

## 2019-04-29 DIAGNOSIS — E11319 Type 2 diabetes mellitus with unspecified diabetic retinopathy without macular edema: Secondary | ICD-10-CM | POA: Diagnosis not present

## 2019-04-29 DIAGNOSIS — F329 Major depressive disorder, single episode, unspecified: Secondary | ICD-10-CM | POA: Diagnosis not present

## 2019-04-29 DIAGNOSIS — K5900 Constipation, unspecified: Secondary | ICD-10-CM | POA: Diagnosis not present

## 2019-04-29 DIAGNOSIS — F419 Anxiety disorder, unspecified: Secondary | ICD-10-CM | POA: Diagnosis not present

## 2019-04-29 DIAGNOSIS — G8250 Quadriplegia, unspecified: Secondary | ICD-10-CM | POA: Diagnosis not present

## 2019-04-29 DIAGNOSIS — K219 Gastro-esophageal reflux disease without esophagitis: Secondary | ICD-10-CM | POA: Diagnosis not present

## 2019-04-29 DIAGNOSIS — G729 Myopathy, unspecified: Secondary | ICD-10-CM | POA: Diagnosis not present

## 2019-04-30 DIAGNOSIS — G8250 Quadriplegia, unspecified: Secondary | ICD-10-CM | POA: Diagnosis not present

## 2019-05-02 DIAGNOSIS — I82402 Acute embolism and thrombosis of unspecified deep veins of left lower extremity: Secondary | ICD-10-CM | POA: Diagnosis not present

## 2019-05-02 DIAGNOSIS — F419 Anxiety disorder, unspecified: Secondary | ICD-10-CM | POA: Diagnosis not present

## 2019-05-02 DIAGNOSIS — E11319 Type 2 diabetes mellitus with unspecified diabetic retinopathy without macular edema: Secondary | ICD-10-CM | POA: Diagnosis not present

## 2019-05-02 DIAGNOSIS — G8250 Quadriplegia, unspecified: Secondary | ICD-10-CM | POA: Diagnosis not present

## 2019-05-02 DIAGNOSIS — G729 Myopathy, unspecified: Secondary | ICD-10-CM | POA: Diagnosis not present

## 2019-05-02 DIAGNOSIS — K219 Gastro-esophageal reflux disease without esophagitis: Secondary | ICD-10-CM | POA: Diagnosis not present

## 2019-05-02 DIAGNOSIS — S14124S Central cord syndrome at C4 level of cervical spinal cord, sequela: Secondary | ICD-10-CM | POA: Diagnosis not present

## 2019-05-02 DIAGNOSIS — F329 Major depressive disorder, single episode, unspecified: Secondary | ICD-10-CM | POA: Diagnosis not present

## 2019-05-02 DIAGNOSIS — K5900 Constipation, unspecified: Secondary | ICD-10-CM | POA: Diagnosis not present

## 2019-05-06 DIAGNOSIS — F329 Major depressive disorder, single episode, unspecified: Secondary | ICD-10-CM | POA: Diagnosis not present

## 2019-05-06 DIAGNOSIS — I82402 Acute embolism and thrombosis of unspecified deep veins of left lower extremity: Secondary | ICD-10-CM | POA: Diagnosis not present

## 2019-05-06 DIAGNOSIS — K5900 Constipation, unspecified: Secondary | ICD-10-CM | POA: Diagnosis not present

## 2019-05-06 DIAGNOSIS — S14124S Central cord syndrome at C4 level of cervical spinal cord, sequela: Secondary | ICD-10-CM | POA: Diagnosis not present

## 2019-05-06 DIAGNOSIS — G729 Myopathy, unspecified: Secondary | ICD-10-CM | POA: Diagnosis not present

## 2019-05-06 DIAGNOSIS — F419 Anxiety disorder, unspecified: Secondary | ICD-10-CM | POA: Diagnosis not present

## 2019-05-06 DIAGNOSIS — E11319 Type 2 diabetes mellitus with unspecified diabetic retinopathy without macular edema: Secondary | ICD-10-CM | POA: Diagnosis not present

## 2019-05-06 DIAGNOSIS — K219 Gastro-esophageal reflux disease without esophagitis: Secondary | ICD-10-CM | POA: Diagnosis not present

## 2019-05-06 DIAGNOSIS — G8250 Quadriplegia, unspecified: Secondary | ICD-10-CM | POA: Diagnosis not present

## 2019-05-08 DIAGNOSIS — G8250 Quadriplegia, unspecified: Secondary | ICD-10-CM | POA: Diagnosis not present

## 2019-05-08 DIAGNOSIS — G729 Myopathy, unspecified: Secondary | ICD-10-CM | POA: Diagnosis not present

## 2019-05-08 DIAGNOSIS — K219 Gastro-esophageal reflux disease without esophagitis: Secondary | ICD-10-CM | POA: Diagnosis not present

## 2019-05-08 DIAGNOSIS — S14124S Central cord syndrome at C4 level of cervical spinal cord, sequela: Secondary | ICD-10-CM | POA: Diagnosis not present

## 2019-05-08 DIAGNOSIS — E11319 Type 2 diabetes mellitus with unspecified diabetic retinopathy without macular edema: Secondary | ICD-10-CM | POA: Diagnosis not present

## 2019-05-08 DIAGNOSIS — I82402 Acute embolism and thrombosis of unspecified deep veins of left lower extremity: Secondary | ICD-10-CM | POA: Diagnosis not present

## 2019-05-08 DIAGNOSIS — F329 Major depressive disorder, single episode, unspecified: Secondary | ICD-10-CM | POA: Diagnosis not present

## 2019-05-08 DIAGNOSIS — K5900 Constipation, unspecified: Secondary | ICD-10-CM | POA: Diagnosis not present

## 2019-05-08 DIAGNOSIS — F419 Anxiety disorder, unspecified: Secondary | ICD-10-CM | POA: Diagnosis not present

## 2019-05-13 DIAGNOSIS — F329 Major depressive disorder, single episode, unspecified: Secondary | ICD-10-CM | POA: Diagnosis not present

## 2019-05-13 DIAGNOSIS — S14124S Central cord syndrome at C4 level of cervical spinal cord, sequela: Secondary | ICD-10-CM | POA: Diagnosis not present

## 2019-05-13 DIAGNOSIS — G729 Myopathy, unspecified: Secondary | ICD-10-CM | POA: Diagnosis not present

## 2019-05-13 DIAGNOSIS — I82402 Acute embolism and thrombosis of unspecified deep veins of left lower extremity: Secondary | ICD-10-CM | POA: Diagnosis not present

## 2019-05-13 DIAGNOSIS — K219 Gastro-esophageal reflux disease without esophagitis: Secondary | ICD-10-CM | POA: Diagnosis not present

## 2019-05-13 DIAGNOSIS — F419 Anxiety disorder, unspecified: Secondary | ICD-10-CM | POA: Diagnosis not present

## 2019-05-13 DIAGNOSIS — K5900 Constipation, unspecified: Secondary | ICD-10-CM | POA: Diagnosis not present

## 2019-05-13 DIAGNOSIS — G8250 Quadriplegia, unspecified: Secondary | ICD-10-CM | POA: Diagnosis not present

## 2019-05-13 DIAGNOSIS — E11319 Type 2 diabetes mellitus with unspecified diabetic retinopathy without macular edema: Secondary | ICD-10-CM | POA: Diagnosis not present

## 2019-05-16 DIAGNOSIS — G8250 Quadriplegia, unspecified: Secondary | ICD-10-CM | POA: Diagnosis not present

## 2019-05-16 DIAGNOSIS — G729 Myopathy, unspecified: Secondary | ICD-10-CM | POA: Diagnosis not present

## 2019-05-16 DIAGNOSIS — E11319 Type 2 diabetes mellitus with unspecified diabetic retinopathy without macular edema: Secondary | ICD-10-CM | POA: Diagnosis not present

## 2019-05-16 DIAGNOSIS — K219 Gastro-esophageal reflux disease without esophagitis: Secondary | ICD-10-CM | POA: Diagnosis not present

## 2019-05-16 DIAGNOSIS — Z794 Long term (current) use of insulin: Secondary | ICD-10-CM | POA: Diagnosis not present

## 2019-05-16 DIAGNOSIS — S14124S Central cord syndrome at C4 level of cervical spinal cord, sequela: Secondary | ICD-10-CM | POA: Diagnosis not present

## 2019-05-16 DIAGNOSIS — F329 Major depressive disorder, single episode, unspecified: Secondary | ICD-10-CM | POA: Diagnosis not present

## 2019-05-16 DIAGNOSIS — E1129 Type 2 diabetes mellitus with other diabetic kidney complication: Secondary | ICD-10-CM | POA: Diagnosis not present

## 2019-05-16 DIAGNOSIS — I82402 Acute embolism and thrombosis of unspecified deep veins of left lower extremity: Secondary | ICD-10-CM | POA: Diagnosis not present

## 2019-05-16 DIAGNOSIS — R809 Proteinuria, unspecified: Secondary | ICD-10-CM | POA: Diagnosis not present

## 2019-05-16 DIAGNOSIS — F419 Anxiety disorder, unspecified: Secondary | ICD-10-CM | POA: Diagnosis not present

## 2019-05-16 DIAGNOSIS — K5900 Constipation, unspecified: Secondary | ICD-10-CM | POA: Diagnosis not present

## 2019-05-20 DIAGNOSIS — R809 Proteinuria, unspecified: Secondary | ICD-10-CM | POA: Diagnosis not present

## 2019-05-20 DIAGNOSIS — E039 Hypothyroidism, unspecified: Secondary | ICD-10-CM | POA: Diagnosis not present

## 2019-05-20 DIAGNOSIS — E1169 Type 2 diabetes mellitus with other specified complication: Secondary | ICD-10-CM | POA: Diagnosis not present

## 2019-05-20 DIAGNOSIS — Z794 Long term (current) use of insulin: Secondary | ICD-10-CM | POA: Diagnosis not present

## 2019-05-20 DIAGNOSIS — E1129 Type 2 diabetes mellitus with other diabetic kidney complication: Secondary | ICD-10-CM | POA: Diagnosis not present

## 2019-05-20 DIAGNOSIS — E785 Hyperlipidemia, unspecified: Secondary | ICD-10-CM | POA: Diagnosis not present

## 2019-05-21 ENCOUNTER — Telehealth: Payer: Self-pay | Admitting: Osteopathic Medicine

## 2019-05-21 DIAGNOSIS — I82402 Acute embolism and thrombosis of unspecified deep veins of left lower extremity: Secondary | ICD-10-CM | POA: Diagnosis not present

## 2019-05-21 DIAGNOSIS — K5900 Constipation, unspecified: Secondary | ICD-10-CM | POA: Diagnosis not present

## 2019-05-21 DIAGNOSIS — F419 Anxiety disorder, unspecified: Secondary | ICD-10-CM | POA: Diagnosis not present

## 2019-05-21 DIAGNOSIS — E11319 Type 2 diabetes mellitus with unspecified diabetic retinopathy without macular edema: Secondary | ICD-10-CM | POA: Diagnosis not present

## 2019-05-21 DIAGNOSIS — G8250 Quadriplegia, unspecified: Secondary | ICD-10-CM | POA: Diagnosis not present

## 2019-05-21 DIAGNOSIS — G729 Myopathy, unspecified: Secondary | ICD-10-CM | POA: Diagnosis not present

## 2019-05-21 DIAGNOSIS — S14124S Central cord syndrome at C4 level of cervical spinal cord, sequela: Secondary | ICD-10-CM | POA: Diagnosis not present

## 2019-05-21 DIAGNOSIS — K219 Gastro-esophageal reflux disease without esophagitis: Secondary | ICD-10-CM | POA: Diagnosis not present

## 2019-05-21 DIAGNOSIS — F329 Major depressive disorder, single episode, unspecified: Secondary | ICD-10-CM | POA: Diagnosis not present

## 2019-05-21 NOTE — Telephone Encounter (Signed)
Patient requested a refeall to a orthotist. She needs a prescription for a AFO. Preferably in the [place] area.   Please advice.

## 2019-05-21 NOTE — Telephone Encounter (Signed)
Faxed back orders from [facility].

## 2019-05-23 DIAGNOSIS — F329 Major depressive disorder, single episode, unspecified: Secondary | ICD-10-CM | POA: Diagnosis not present

## 2019-05-23 DIAGNOSIS — S14124S Central cord syndrome at C4 level of cervical spinal cord, sequela: Secondary | ICD-10-CM | POA: Diagnosis not present

## 2019-05-23 DIAGNOSIS — G8250 Quadriplegia, unspecified: Secondary | ICD-10-CM | POA: Diagnosis not present

## 2019-05-23 DIAGNOSIS — G729 Myopathy, unspecified: Secondary | ICD-10-CM | POA: Diagnosis not present

## 2019-05-23 DIAGNOSIS — K219 Gastro-esophageal reflux disease without esophagitis: Secondary | ICD-10-CM | POA: Diagnosis not present

## 2019-05-23 DIAGNOSIS — E11319 Type 2 diabetes mellitus with unspecified diabetic retinopathy without macular edema: Secondary | ICD-10-CM | POA: Diagnosis not present

## 2019-05-23 DIAGNOSIS — I82402 Acute embolism and thrombosis of unspecified deep veins of left lower extremity: Secondary | ICD-10-CM | POA: Diagnosis not present

## 2019-05-23 DIAGNOSIS — F419 Anxiety disorder, unspecified: Secondary | ICD-10-CM | POA: Diagnosis not present

## 2019-05-23 DIAGNOSIS — K5900 Constipation, unspecified: Secondary | ICD-10-CM | POA: Diagnosis not present

## 2019-05-28 DIAGNOSIS — K5900 Constipation, unspecified: Secondary | ICD-10-CM | POA: Diagnosis not present

## 2019-05-28 DIAGNOSIS — I82402 Acute embolism and thrombosis of unspecified deep veins of left lower extremity: Secondary | ICD-10-CM | POA: Diagnosis not present

## 2019-05-28 DIAGNOSIS — S14124S Central cord syndrome at C4 level of cervical spinal cord, sequela: Secondary | ICD-10-CM | POA: Diagnosis not present

## 2019-05-28 DIAGNOSIS — K219 Gastro-esophageal reflux disease without esophagitis: Secondary | ICD-10-CM | POA: Diagnosis not present

## 2019-05-28 DIAGNOSIS — G729 Myopathy, unspecified: Secondary | ICD-10-CM | POA: Diagnosis not present

## 2019-05-28 DIAGNOSIS — F329 Major depressive disorder, single episode, unspecified: Secondary | ICD-10-CM | POA: Diagnosis not present

## 2019-05-28 DIAGNOSIS — F419 Anxiety disorder, unspecified: Secondary | ICD-10-CM | POA: Diagnosis not present

## 2019-05-28 DIAGNOSIS — E11319 Type 2 diabetes mellitus with unspecified diabetic retinopathy without macular edema: Secondary | ICD-10-CM | POA: Diagnosis not present

## 2019-05-28 DIAGNOSIS — G8250 Quadriplegia, unspecified: Secondary | ICD-10-CM | POA: Diagnosis not present

## 2019-05-30 ENCOUNTER — Telehealth: Payer: Self-pay

## 2019-05-30 DIAGNOSIS — G8250 Quadriplegia, unspecified: Secondary | ICD-10-CM | POA: Diagnosis not present

## 2019-05-30 DIAGNOSIS — S14124S Central cord syndrome at C4 level of cervical spinal cord, sequela: Secondary | ICD-10-CM | POA: Diagnosis not present

## 2019-05-30 DIAGNOSIS — E11319 Type 2 diabetes mellitus with unspecified diabetic retinopathy without macular edema: Secondary | ICD-10-CM | POA: Diagnosis not present

## 2019-05-30 DIAGNOSIS — K219 Gastro-esophageal reflux disease without esophagitis: Secondary | ICD-10-CM | POA: Diagnosis not present

## 2019-05-30 DIAGNOSIS — F419 Anxiety disorder, unspecified: Secondary | ICD-10-CM | POA: Diagnosis not present

## 2019-05-30 DIAGNOSIS — K5900 Constipation, unspecified: Secondary | ICD-10-CM | POA: Diagnosis not present

## 2019-05-30 DIAGNOSIS — G729 Myopathy, unspecified: Secondary | ICD-10-CM | POA: Diagnosis not present

## 2019-05-30 DIAGNOSIS — I82402 Acute embolism and thrombosis of unspecified deep veins of left lower extremity: Secondary | ICD-10-CM | POA: Diagnosis not present

## 2019-05-30 DIAGNOSIS — F329 Major depressive disorder, single episode, unspecified: Secondary | ICD-10-CM | POA: Diagnosis not present

## 2019-05-30 NOTE — Telephone Encounter (Signed)
[facility] can send me an order to sign? Not sure what [facility] is - is this a facility or  A home service? What is specialty - sports med? Podiatry? Need details.

## 2019-05-30 NOTE — Telephone Encounter (Signed)
Home Health is requesting a referral be placed to [facility] for ASO evaluation for orthotics.

## 2019-05-31 ENCOUNTER — Other Ambulatory Visit: Payer: Self-pay

## 2019-05-31 DIAGNOSIS — G8250 Quadriplegia, unspecified: Secondary | ICD-10-CM | POA: Diagnosis not present

## 2019-05-31 NOTE — Telephone Encounter (Signed)
Fax order to [facility]:  [phone] Phone Number: [phone]

## 2019-05-31 NOTE — Telephone Encounter (Signed)
[facility] was contacted.  Orthotic Rx required with dx codes. Fax to [phone].  Order has been placed and sent to Dr. Lyn Hollingshead for dx codes and sign-off.

## 2019-06-03 DIAGNOSIS — I82402 Acute embolism and thrombosis of unspecified deep veins of left lower extremity: Secondary | ICD-10-CM | POA: Diagnosis not present

## 2019-06-03 DIAGNOSIS — K219 Gastro-esophageal reflux disease without esophagitis: Secondary | ICD-10-CM | POA: Diagnosis not present

## 2019-06-03 DIAGNOSIS — F329 Major depressive disorder, single episode, unspecified: Secondary | ICD-10-CM | POA: Diagnosis not present

## 2019-06-03 DIAGNOSIS — F419 Anxiety disorder, unspecified: Secondary | ICD-10-CM | POA: Diagnosis not present

## 2019-06-03 DIAGNOSIS — G729 Myopathy, unspecified: Secondary | ICD-10-CM | POA: Diagnosis not present

## 2019-06-03 DIAGNOSIS — G8250 Quadriplegia, unspecified: Secondary | ICD-10-CM | POA: Diagnosis not present

## 2019-06-03 DIAGNOSIS — K5900 Constipation, unspecified: Secondary | ICD-10-CM | POA: Diagnosis not present

## 2019-06-03 DIAGNOSIS — S14124S Central cord syndrome at C4 level of cervical spinal cord, sequela: Secondary | ICD-10-CM | POA: Diagnosis not present

## 2019-06-03 DIAGNOSIS — E11319 Type 2 diabetes mellitus with unspecified diabetic retinopathy without macular edema: Secondary | ICD-10-CM | POA: Diagnosis not present

## 2019-06-03 MED ORDER — AMBULATORY NON FORMULARY MEDICATION: 0 refills | Status: AC

## 2019-06-04 ENCOUNTER — Telehealth: Payer: Self-pay

## 2019-06-04 ENCOUNTER — Other Ambulatory Visit: Payer: Self-pay

## 2019-06-04 MED ORDER — OXYCODONE HCL 10 MG PO TABS: 10.0000 mg | ORAL_TABLET | Freq: Four times a day (QID) | ORAL | 0 refills | Status: DC

## 2019-06-04 NOTE — Telephone Encounter (Signed)
Pt called for oxycodone refill to be sent to [facility].  No active Rx showing for med.

## 2019-06-06 ENCOUNTER — Telehealth: Payer: Self-pay

## 2019-06-06 DIAGNOSIS — K219 Gastro-esophageal reflux disease without esophagitis: Secondary | ICD-10-CM | POA: Diagnosis not present

## 2019-06-06 DIAGNOSIS — S14124S Central cord syndrome at C4 level of cervical spinal cord, sequela: Secondary | ICD-10-CM | POA: Diagnosis not present

## 2019-06-06 DIAGNOSIS — I82402 Acute embolism and thrombosis of unspecified deep veins of left lower extremity: Secondary | ICD-10-CM | POA: Diagnosis not present

## 2019-06-06 DIAGNOSIS — E11319 Type 2 diabetes mellitus with unspecified diabetic retinopathy without macular edema: Secondary | ICD-10-CM | POA: Diagnosis not present

## 2019-06-06 DIAGNOSIS — K5900 Constipation, unspecified: Secondary | ICD-10-CM | POA: Diagnosis not present

## 2019-06-06 DIAGNOSIS — G8250 Quadriplegia, unspecified: Secondary | ICD-10-CM | POA: Diagnosis not present

## 2019-06-06 DIAGNOSIS — F329 Major depressive disorder, single episode, unspecified: Secondary | ICD-10-CM | POA: Diagnosis not present

## 2019-06-06 DIAGNOSIS — F419 Anxiety disorder, unspecified: Secondary | ICD-10-CM | POA: Diagnosis not present

## 2019-06-06 DIAGNOSIS — G729 Myopathy, unspecified: Secondary | ICD-10-CM | POA: Diagnosis not present

## 2019-06-06 NOTE — Telephone Encounter (Signed)
Pt called stating her legs and arms are becoming increasingly stiff and numb.   Question: Should she increase the baclofen or gabapentin?  Also she's having a reaction to the pollen.  Question: Is there a medicine she can take to help?  All meds should be sent to [facility].

## 2019-06-10 DIAGNOSIS — I82402 Acute embolism and thrombosis of unspecified deep veins of left lower extremity: Secondary | ICD-10-CM | POA: Diagnosis not present

## 2019-06-10 DIAGNOSIS — K219 Gastro-esophageal reflux disease without esophagitis: Secondary | ICD-10-CM | POA: Diagnosis not present

## 2019-06-10 DIAGNOSIS — F419 Anxiety disorder, unspecified: Secondary | ICD-10-CM | POA: Diagnosis not present

## 2019-06-10 DIAGNOSIS — E11319 Type 2 diabetes mellitus with unspecified diabetic retinopathy without macular edema: Secondary | ICD-10-CM | POA: Diagnosis not present

## 2019-06-10 DIAGNOSIS — G729 Myopathy, unspecified: Secondary | ICD-10-CM | POA: Diagnosis not present

## 2019-06-10 DIAGNOSIS — G8250 Quadriplegia, unspecified: Secondary | ICD-10-CM | POA: Diagnosis not present

## 2019-06-10 DIAGNOSIS — K5900 Constipation, unspecified: Secondary | ICD-10-CM | POA: Diagnosis not present

## 2019-06-10 DIAGNOSIS — S14124S Central cord syndrome at C4 level of cervical spinal cord, sequela: Secondary | ICD-10-CM | POA: Diagnosis not present

## 2019-06-10 DIAGNOSIS — F329 Major depressive disorder, single episode, unspecified: Secondary | ICD-10-CM | POA: Diagnosis not present

## 2019-06-12 DIAGNOSIS — F329 Major depressive disorder, single episode, unspecified: Secondary | ICD-10-CM | POA: Diagnosis not present

## 2019-06-12 DIAGNOSIS — K5900 Constipation, unspecified: Secondary | ICD-10-CM | POA: Diagnosis not present

## 2019-06-12 DIAGNOSIS — F419 Anxiety disorder, unspecified: Secondary | ICD-10-CM | POA: Diagnosis not present

## 2019-06-12 DIAGNOSIS — I82402 Acute embolism and thrombosis of unspecified deep veins of left lower extremity: Secondary | ICD-10-CM | POA: Diagnosis not present

## 2019-06-12 DIAGNOSIS — G8250 Quadriplegia, unspecified: Secondary | ICD-10-CM | POA: Diagnosis not present

## 2019-06-12 DIAGNOSIS — E11319 Type 2 diabetes mellitus with unspecified diabetic retinopathy without macular edema: Secondary | ICD-10-CM | POA: Diagnosis not present

## 2019-06-12 DIAGNOSIS — G729 Myopathy, unspecified: Secondary | ICD-10-CM | POA: Diagnosis not present

## 2019-06-12 DIAGNOSIS — S14124S Central cord syndrome at C4 level of cervical spinal cord, sequela: Secondary | ICD-10-CM | POA: Diagnosis not present

## 2019-06-12 DIAGNOSIS — K219 Gastro-esophageal reflux disease without esophagitis: Secondary | ICD-10-CM | POA: Diagnosis not present

## 2019-06-17 DIAGNOSIS — I82402 Acute embolism and thrombosis of unspecified deep veins of left lower extremity: Secondary | ICD-10-CM | POA: Diagnosis not present

## 2019-06-17 DIAGNOSIS — F419 Anxiety disorder, unspecified: Secondary | ICD-10-CM | POA: Diagnosis not present

## 2019-06-17 DIAGNOSIS — K219 Gastro-esophageal reflux disease without esophagitis: Secondary | ICD-10-CM | POA: Diagnosis not present

## 2019-06-17 DIAGNOSIS — K5900 Constipation, unspecified: Secondary | ICD-10-CM | POA: Diagnosis not present

## 2019-06-17 DIAGNOSIS — G729 Myopathy, unspecified: Secondary | ICD-10-CM | POA: Diagnosis not present

## 2019-06-17 DIAGNOSIS — E11319 Type 2 diabetes mellitus with unspecified diabetic retinopathy without macular edema: Secondary | ICD-10-CM | POA: Diagnosis not present

## 2019-06-17 DIAGNOSIS — S14124S Central cord syndrome at C4 level of cervical spinal cord, sequela: Secondary | ICD-10-CM | POA: Diagnosis not present

## 2019-06-17 DIAGNOSIS — F329 Major depressive disorder, single episode, unspecified: Secondary | ICD-10-CM | POA: Diagnosis not present

## 2019-06-17 DIAGNOSIS — G8250 Quadriplegia, unspecified: Secondary | ICD-10-CM | POA: Diagnosis not present

## 2019-06-19 DIAGNOSIS — I82402 Acute embolism and thrombosis of unspecified deep veins of left lower extremity: Secondary | ICD-10-CM | POA: Diagnosis not present

## 2019-06-19 DIAGNOSIS — S14124S Central cord syndrome at C4 level of cervical spinal cord, sequela: Secondary | ICD-10-CM | POA: Diagnosis not present

## 2019-06-19 DIAGNOSIS — G8250 Quadriplegia, unspecified: Secondary | ICD-10-CM | POA: Diagnosis not present

## 2019-06-19 DIAGNOSIS — F329 Major depressive disorder, single episode, unspecified: Secondary | ICD-10-CM | POA: Diagnosis not present

## 2019-06-19 DIAGNOSIS — E11319 Type 2 diabetes mellitus with unspecified diabetic retinopathy without macular edema: Secondary | ICD-10-CM | POA: Diagnosis not present

## 2019-06-19 DIAGNOSIS — K5900 Constipation, unspecified: Secondary | ICD-10-CM | POA: Diagnosis not present

## 2019-06-19 DIAGNOSIS — G729 Myopathy, unspecified: Secondary | ICD-10-CM | POA: Diagnosis not present

## 2019-06-19 DIAGNOSIS — K219 Gastro-esophageal reflux disease without esophagitis: Secondary | ICD-10-CM | POA: Diagnosis not present

## 2019-06-19 DIAGNOSIS — F419 Anxiety disorder, unspecified: Secondary | ICD-10-CM | POA: Diagnosis not present

## 2019-06-20 ENCOUNTER — Telehealth: Payer: Self-pay

## 2019-06-20 MED ORDER — AMBULATORY NON FORMULARY MEDICATION: 99 refills | Status: DC

## 2019-06-20 NOTE — Telephone Encounter (Signed)
Printed

## 2019-06-20 NOTE — Telephone Encounter (Signed)
Veronica [name] called and left a message stating she needs a prescription for resting hand splints sent to [facility].   Fax - [phone]

## 2019-06-21 DIAGNOSIS — E1165 Type 2 diabetes mellitus with hyperglycemia: Secondary | ICD-10-CM | POA: Diagnosis not present

## 2019-06-21 NOTE — Telephone Encounter (Signed)
I am unable to find the prescription. I am sure it has been faxed by [place].

## 2019-06-25 DIAGNOSIS — K219 Gastro-esophageal reflux disease without esophagitis: Secondary | ICD-10-CM | POA: Diagnosis not present

## 2019-06-25 DIAGNOSIS — G8250 Quadriplegia, unspecified: Secondary | ICD-10-CM | POA: Diagnosis not present

## 2019-06-25 DIAGNOSIS — E11319 Type 2 diabetes mellitus with unspecified diabetic retinopathy without macular edema: Secondary | ICD-10-CM | POA: Diagnosis not present

## 2019-06-25 DIAGNOSIS — G729 Myopathy, unspecified: Secondary | ICD-10-CM | POA: Diagnosis not present

## 2019-06-25 DIAGNOSIS — S14124S Central cord syndrome at C4 level of cervical spinal cord, sequela: Secondary | ICD-10-CM | POA: Diagnosis not present

## 2019-06-25 DIAGNOSIS — K5900 Constipation, unspecified: Secondary | ICD-10-CM | POA: Diagnosis not present

## 2019-06-25 DIAGNOSIS — F329 Major depressive disorder, single episode, unspecified: Secondary | ICD-10-CM | POA: Diagnosis not present

## 2019-06-25 DIAGNOSIS — I82402 Acute embolism and thrombosis of unspecified deep veins of left lower extremity: Secondary | ICD-10-CM | POA: Diagnosis not present

## 2019-06-25 DIAGNOSIS — F419 Anxiety disorder, unspecified: Secondary | ICD-10-CM | POA: Diagnosis not present

## 2019-06-25 NOTE — Telephone Encounter (Signed)
Dorene Sorrow, [name]'s husband, called and states the splint prescription has not been received by [facility]. Please print and fax.   See note below.

## 2019-06-26 ENCOUNTER — Other Ambulatory Visit: Payer: Self-pay | Admitting: Medical-Surgical

## 2019-06-26 MED ORDER — AMBULATORY NON FORMULARY MEDICATION: 99 refills | Status: DC

## 2019-06-26 MED ORDER — AMBULATORY NON FORMULARY MEDICATION: 99 refills | Status: AC

## 2019-06-27 DIAGNOSIS — K219 Gastro-esophageal reflux disease without esophagitis: Secondary | ICD-10-CM | POA: Diagnosis not present

## 2019-06-27 DIAGNOSIS — F419 Anxiety disorder, unspecified: Secondary | ICD-10-CM | POA: Diagnosis not present

## 2019-06-27 DIAGNOSIS — I82402 Acute embolism and thrombosis of unspecified deep veins of left lower extremity: Secondary | ICD-10-CM | POA: Diagnosis not present

## 2019-06-27 DIAGNOSIS — S14124S Central cord syndrome at C4 level of cervical spinal cord, sequela: Secondary | ICD-10-CM | POA: Diagnosis not present

## 2019-06-27 DIAGNOSIS — G729 Myopathy, unspecified: Secondary | ICD-10-CM | POA: Diagnosis not present

## 2019-06-27 DIAGNOSIS — K5900 Constipation, unspecified: Secondary | ICD-10-CM | POA: Diagnosis not present

## 2019-06-27 DIAGNOSIS — E11319 Type 2 diabetes mellitus with unspecified diabetic retinopathy without macular edema: Secondary | ICD-10-CM | POA: Diagnosis not present

## 2019-06-27 DIAGNOSIS — G8250 Quadriplegia, unspecified: Secondary | ICD-10-CM | POA: Diagnosis not present

## 2019-06-27 DIAGNOSIS — F329 Major depressive disorder, single episode, unspecified: Secondary | ICD-10-CM | POA: Diagnosis not present

## 2019-06-30 DIAGNOSIS — G8250 Quadriplegia, unspecified: Secondary | ICD-10-CM | POA: Diagnosis not present

## 2019-07-01 ENCOUNTER — Other Ambulatory Visit: Payer: Self-pay

## 2019-07-01 ENCOUNTER — Telehealth: Payer: Self-pay | Admitting: Osteopathic Medicine

## 2019-07-01 DIAGNOSIS — G729 Myopathy, unspecified: Secondary | ICD-10-CM | POA: Diagnosis not present

## 2019-07-01 DIAGNOSIS — F419 Anxiety disorder, unspecified: Secondary | ICD-10-CM | POA: Diagnosis not present

## 2019-07-01 DIAGNOSIS — K5900 Constipation, unspecified: Secondary | ICD-10-CM | POA: Diagnosis not present

## 2019-07-01 DIAGNOSIS — I82402 Acute embolism and thrombosis of unspecified deep veins of left lower extremity: Secondary | ICD-10-CM | POA: Diagnosis not present

## 2019-07-01 DIAGNOSIS — G8250 Quadriplegia, unspecified: Secondary | ICD-10-CM | POA: Diagnosis not present

## 2019-07-01 DIAGNOSIS — F329 Major depressive disorder, single episode, unspecified: Secondary | ICD-10-CM | POA: Diagnosis not present

## 2019-07-01 DIAGNOSIS — S14124S Central cord syndrome at C4 level of cervical spinal cord, sequela: Secondary | ICD-10-CM | POA: Diagnosis not present

## 2019-07-01 DIAGNOSIS — F32A Depression, unspecified: Secondary | ICD-10-CM

## 2019-07-01 DIAGNOSIS — K219 Gastro-esophageal reflux disease without esophagitis: Secondary | ICD-10-CM | POA: Diagnosis not present

## 2019-07-01 DIAGNOSIS — E11319 Type 2 diabetes mellitus with unspecified diabetic retinopathy without macular edema: Secondary | ICD-10-CM | POA: Diagnosis not present

## 2019-07-01 NOTE — Telephone Encounter (Signed)
Pt called requesting med refills for oxycodone (last written 06/04/19) & clonazepam (last written 03/25/19). [facility] Rxs pended.

## 2019-07-01 NOTE — Telephone Encounter (Signed)
Patient would like to know if her visit can be virtual. States she can not come in. This is for the visit on 05/13. Please advise.

## 2019-07-01 NOTE — Telephone Encounter (Signed)
Appointment has been switched. No further questions at this time.

## 2019-07-01 NOTE — Telephone Encounter (Signed)
That's fine

## 2019-07-02 MED ORDER — OXYCODONE HCL 10 MG PO TABS: 10.0000 mg | ORAL_TABLET | Freq: Four times a day (QID) | ORAL | 0 refills | Status: DC

## 2019-07-02 MED ORDER — CLONAZEPAM 0.5 MG PO TABS: 0.5000 mg | ORAL_TABLET | Freq: Three times a day (TID) | ORAL | 2 refills | Status: DC | PRN

## 2019-07-02 NOTE — Telephone Encounter (Signed)
Will address at upcoming visit.

## 2019-07-02 NOTE — Telephone Encounter (Signed)
Veronica [name] from [employer] [phone]) called regarding rx sent to [facility] pharmacy for bilateral ankle braces. Rx is under evaluation. Veronica [name] is requesting visit summary including provider's note why the need for the braces to be faxed to [phone]. Pls advise, thanks.

## 2019-07-04 ENCOUNTER — Other Ambulatory Visit: Payer: Self-pay

## 2019-07-04 ENCOUNTER — Encounter: Payer: Self-pay | Admitting: Osteopathic Medicine

## 2019-07-04 ENCOUNTER — Telehealth (INDEPENDENT_AMBULATORY_CARE_PROVIDER_SITE_OTHER): Payer: Medicare HMO | Admitting: Osteopathic Medicine

## 2019-07-04 VITALS — BP 110/72 | HR 78 | Temp 96.7°F

## 2019-07-04 DIAGNOSIS — G8250 Quadriplegia, unspecified: Secondary | ICD-10-CM | POA: Diagnosis not present

## 2019-07-04 DIAGNOSIS — S14124S Central cord syndrome at C4 level of cervical spinal cord, sequela: Secondary | ICD-10-CM | POA: Diagnosis not present

## 2019-07-04 DIAGNOSIS — G729 Myopathy, unspecified: Secondary | ICD-10-CM | POA: Diagnosis not present

## 2019-07-04 DIAGNOSIS — F32A Depression, unspecified: Secondary | ICD-10-CM

## 2019-07-04 DIAGNOSIS — Z86718 Personal history of other venous thrombosis and embolism: Secondary | ICD-10-CM

## 2019-07-04 DIAGNOSIS — F419 Anxiety disorder, unspecified: Secondary | ICD-10-CM

## 2019-07-04 DIAGNOSIS — G5603 Carpal tunnel syndrome, bilateral upper limbs: Secondary | ICD-10-CM

## 2019-07-04 DIAGNOSIS — M62838 Other muscle spasm: Secondary | ICD-10-CM | POA: Diagnosis not present

## 2019-07-04 DIAGNOSIS — K219 Gastro-esophageal reflux disease without esophagitis: Secondary | ICD-10-CM | POA: Diagnosis not present

## 2019-07-04 DIAGNOSIS — I82402 Acute embolism and thrombosis of unspecified deep veins of left lower extremity: Secondary | ICD-10-CM | POA: Diagnosis not present

## 2019-07-04 DIAGNOSIS — K5900 Constipation, unspecified: Secondary | ICD-10-CM | POA: Diagnosis not present

## 2019-07-04 DIAGNOSIS — F329 Major depressive disorder, single episode, unspecified: Secondary | ICD-10-CM

## 2019-07-04 DIAGNOSIS — E11319 Type 2 diabetes mellitus with unspecified diabetic retinopathy without macular edema: Secondary | ICD-10-CM | POA: Diagnosis not present

## 2019-07-04 MED ORDER — ELIQUIS 5 MG PO TABS: 5.0000 mg | ORAL_TABLET | Freq: Two times a day (BID) | ORAL | 3 refills | Status: AC

## 2019-07-04 MED ORDER — ARIPIPRAZOLE 10 MG PO TABS: 10.0000 mg | ORAL_TABLET | Freq: Every day | ORAL | 3 refills | Status: DC

## 2019-07-04 NOTE — Progress Notes (Signed)
Veronica [name] is a 79 y.o. female who presents to  [facility] at [facility]  today, 07/04/19, seeking care for the following: . Routine check-up pain Rx, other refills. Doing well. Visit is over the phone, pt not comfortable coming in to office d/t pandemic      ASSESSMENT & PLAN with other pertinent history/findings:  The primary encounter diagnosis was Muscle spasticity. Diagnoses of Bilateral carpal tunnel syndrome, Central cord syndrome at C4 level of cervical spinal cord, sequela (HCC), Anxiety and depression, and History of DVT of lower extremity were also pertinent to this visit.  Chronic issues stable Refilled medications  Meds ordered this encounter  Medications  . ARIPiprazole (ABILIFY) 10 MG tablet    Sig: Take 1 tablet (10 mg total) by mouth daily.    Dispense:  90 tablet    Refill:  3  . ELIQUIS 5 MG TABS tablet    Sig: Take 1 tablet (5 mg total) by mouth 2 (two) times daily.    Dispense:  180 tablet    Refill:  3       Follow-up instructions: Return in about 3 months (around 10/04/2019) for routien refills, in office visit if poss (haven't physcially seen patient since COVID started, OV40).                                         BP 110/72 (BP Location: Left Arm, Patient Position: Sitting, Cuff Size: Normal)   Pulse 78   Temp (!) 96.7 F (35.9 C) (Oral)   Current Meds  Medication Sig  . Accu-Chek Softclix Lancets lancets Use as instructed up to qid  . acetaminophen (TYLENOL) 500 MG tablet Take by mouth.  . Alcohol Swabs PADS Per insurance coverage, use as directed w/ glucose testing  . AMBULATORY NON FORMULARY MEDICATION Medication Name: purwick external urinary catheter. Sig: apply externally as directed PRN urination  . AMBULATORY NON FORMULARY MEDICATION Medication Name: AFO evaluation Patient's phone number: [phone]  . AMBULATORY NON FORMULARY MEDICATION Medication  Name: resting hand splint bilateral Dx quadriplegia w/ spasticity, wrist pain, carpal tunnel  . ARIPiprazole (ABILIFY) 10 MG tablet Take 1 tablet (10 mg total) by mouth daily.  . baclofen (LIORESAL) 10 MG tablet TAKE 1/2 TABLET (5 MG) BY MOUTH 3 (THREE) TIMES DAILY AS NEEDED.  . bisacodyl (DULCOLAX) 10 MG suppository Place rectally.  . citalopram (CELEXA) 40 MG tablet Take 1 tablet (40 mg total) by mouth daily.  . clonazePAM (KLONOPIN) 0.5 MG tablet Take 1 tablet (0.5 mg total) by mouth 3 (three) times daily as needed (#90 for thirty days). for anxiety  . ELIQUIS 5 MG TABS tablet Take 1 tablet (5 mg total) by mouth 2 (two) times daily.  [name] FIASP FLEXTOUCH 100 UNIT/ML FlexTouch Pen   . glucose blood test strip Use as instructed up to qid  . insulin aspart (FIASP FLEXTOUCH) 100 UNIT/ML FlexPen Inject into the skin.  [name] insulin aspart (FIASP FLEXTOUCH) 100 UNIT/ML FlexTouch Pen Inject into the skin.  [name] insulin degludec (TRESIBA) 100 UNIT/ML SOPN FlexTouch Pen Inject into the skin.  [name] levothyroxine (SYNTHROID, LEVOTHROID) 88 MCG tablet Take 1 tablet (88 mcg total) by mouth daily. FOLLOWING WITH ENDOCRINOLOGY DR LEVY  . Oxycodone HCl 10 MG TABS Take 1 tablet (10 mg total) by mouth every 6 (six) hours.  . promethazine (PHENERGAN) 25 MG tablet Take 1  tablet (25 mg total) by mouth every 6 (six) hours as needed for nausea or vomiting.  . [DISCONTINUED] ARIPiprazole (ABILIFY) 5 MG tablet Take 2 tablets (10 mg total) by mouth daily.  . [DISCONTINUED] doxepin (SINEQUAN) 10 MG capsule Take 1 capsule (10 mg total) by mouth at bedtime.  . [DISCONTINUED] ELIQUIS 5 MG TABS tablet Take 1 tablet (5 mg total) by mouth 2 (two) times daily.  . [DISCONTINUED] insulin NPH Human (HUMULIN N,NOVOLIN N) 100 UNIT/ML injection Inject into the skin.  . [DISCONTINUED] insulin regular (NOVOLIN R,HUMULIN R) 100 units/mL injection Inject into the skin.  . [DISCONTINUED] metFORMIN (GLUCOPHAGE-XR) 500 MG 24 hr tablet Take by mouth.  FOLLOWING W/ ENDOCRINOLOGY DR. Hartford Poli  . [DISCONTINUED] ranitidine (ZANTAC) 300 MG capsule Take 1 capsule (300 mg total) by mouth every evening.    No results found for this or any previous visit (from the past 72 hour(s)).  No results found.  Depression screen [facility] 2/9 04/16/2019 04/02/2019 06/04/2018  Decreased Interest 1 0 0  Down, Depressed, Hopeless 1 1 0  PHQ - 2 Score 2 1 0  Altered sleeping 0 0 -  Tired, decreased energy 1 0 -  Change in appetite 0 0 -  Feeling bad or failure about yourself  0 1 -  Trouble concentrating 0 0 -  Moving slowly or fidgety/restless 0 0 -  Suicidal thoughts 0 0 -  PHQ-9 Score 3 2 -  Difficult doing work/chores Not difficult at all Not difficult at all -    GAD 7 : Generalized Anxiety Score 04/02/2019 06/04/2018 10/24/2017 08/10/2017  Nervous, Anxious, on Edge 0 3 0 2  Control/stop worrying 0 0 0 1  Worry too much - different things 0 0 0 1  Trouble relaxing 0 1 0 2  Restless 0 0 0 0  Easily annoyed or irritable 0 1 0 1  Afraid - awful might happen 0 1 0 2  Total GAD 7 Score 0 6 0 9  Anxiety Difficulty Not difficult at all Not difficult at all Not difficult at all Not difficult at all      All questions at time of visit were answered - patient instructed to contact office with any additional concerns or updates.  ER/RTC precautions were reviewed with the patient.  Please note: voice recognition software was used to produce this document, and typos may escape review. Please contact Dr. Sheppard Coil for any needed clarifications.   Total encounter time: 30 minutes.

## 2019-07-09 ENCOUNTER — Telehealth: Payer: Self-pay

## 2019-07-09 NOTE — Telephone Encounter (Signed)
Britta Mccreedy from [employer] called requesting last visit summary notes for the pt. She is working on getting prior authorization from [employer]. Unable to print out most recent visit summary - incomplete. She is requesting for notes to be faxed to her at [phone].

## 2019-07-10 DIAGNOSIS — I82402 Acute embolism and thrombosis of unspecified deep veins of left lower extremity: Secondary | ICD-10-CM | POA: Diagnosis not present

## 2019-07-10 DIAGNOSIS — S14109D Unspecified injury at unspecified level of cervical spinal cord, subsequent encounter: Secondary | ICD-10-CM | POA: Diagnosis not present

## 2019-07-10 DIAGNOSIS — K5900 Constipation, unspecified: Secondary | ICD-10-CM | POA: Diagnosis not present

## 2019-07-10 DIAGNOSIS — E11319 Type 2 diabetes mellitus with unspecified diabetic retinopathy without macular edema: Secondary | ICD-10-CM | POA: Diagnosis not present

## 2019-07-10 DIAGNOSIS — G8250 Quadriplegia, unspecified: Secondary | ICD-10-CM | POA: Diagnosis not present

## 2019-07-10 DIAGNOSIS — F329 Major depressive disorder, single episode, unspecified: Secondary | ICD-10-CM | POA: Diagnosis not present

## 2019-07-10 DIAGNOSIS — G729 Myopathy, unspecified: Secondary | ICD-10-CM | POA: Diagnosis not present

## 2019-07-10 DIAGNOSIS — F419 Anxiety disorder, unspecified: Secondary | ICD-10-CM | POA: Diagnosis not present

## 2019-07-10 DIAGNOSIS — K219 Gastro-esophageal reflux disease without esophagitis: Secondary | ICD-10-CM | POA: Diagnosis not present

## 2019-07-10 DIAGNOSIS — S14124S Central cord syndrome at C4 level of cervical spinal cord, sequela: Secondary | ICD-10-CM | POA: Diagnosis not present

## 2019-07-10 DIAGNOSIS — S14159D Other incomplete lesion at unspecified level of cervical spinal cord, subsequent encounter: Secondary | ICD-10-CM | POA: Diagnosis not present

## 2019-07-11 DIAGNOSIS — I82402 Acute embolism and thrombosis of unspecified deep veins of left lower extremity: Secondary | ICD-10-CM | POA: Diagnosis not present

## 2019-07-11 DIAGNOSIS — G8250 Quadriplegia, unspecified: Secondary | ICD-10-CM | POA: Diagnosis not present

## 2019-07-11 DIAGNOSIS — K5900 Constipation, unspecified: Secondary | ICD-10-CM | POA: Diagnosis not present

## 2019-07-11 DIAGNOSIS — K219 Gastro-esophageal reflux disease without esophagitis: Secondary | ICD-10-CM | POA: Diagnosis not present

## 2019-07-11 DIAGNOSIS — G729 Myopathy, unspecified: Secondary | ICD-10-CM | POA: Diagnosis not present

## 2019-07-11 DIAGNOSIS — F419 Anxiety disorder, unspecified: Secondary | ICD-10-CM | POA: Diagnosis not present

## 2019-07-11 DIAGNOSIS — F329 Major depressive disorder, single episode, unspecified: Secondary | ICD-10-CM | POA: Diagnosis not present

## 2019-07-11 DIAGNOSIS — E11319 Type 2 diabetes mellitus with unspecified diabetic retinopathy without macular edema: Secondary | ICD-10-CM | POA: Diagnosis not present

## 2019-07-11 DIAGNOSIS — S14124S Central cord syndrome at C4 level of cervical spinal cord, sequela: Secondary | ICD-10-CM | POA: Diagnosis not present

## 2019-07-11 NOTE — Telephone Encounter (Signed)
Notes done should eb able to sent whatver they need

## 2019-07-15 NOTE — Telephone Encounter (Signed)
Task completed. Last visit summary notes faxed to [phone], attn: Britta Mccreedy. Confirmation rec'd.

## 2019-07-16 DIAGNOSIS — F329 Major depressive disorder, single episode, unspecified: Secondary | ICD-10-CM | POA: Diagnosis not present

## 2019-07-16 DIAGNOSIS — S14124S Central cord syndrome at C4 level of cervical spinal cord, sequela: Secondary | ICD-10-CM | POA: Diagnosis not present

## 2019-07-16 DIAGNOSIS — K5900 Constipation, unspecified: Secondary | ICD-10-CM | POA: Diagnosis not present

## 2019-07-16 DIAGNOSIS — G729 Myopathy, unspecified: Secondary | ICD-10-CM | POA: Diagnosis not present

## 2019-07-16 DIAGNOSIS — E11319 Type 2 diabetes mellitus with unspecified diabetic retinopathy without macular edema: Secondary | ICD-10-CM | POA: Diagnosis not present

## 2019-07-16 DIAGNOSIS — K219 Gastro-esophageal reflux disease without esophagitis: Secondary | ICD-10-CM | POA: Diagnosis not present

## 2019-07-16 DIAGNOSIS — I82402 Acute embolism and thrombosis of unspecified deep veins of left lower extremity: Secondary | ICD-10-CM | POA: Diagnosis not present

## 2019-07-16 DIAGNOSIS — G8250 Quadriplegia, unspecified: Secondary | ICD-10-CM | POA: Diagnosis not present

## 2019-07-16 DIAGNOSIS — F419 Anxiety disorder, unspecified: Secondary | ICD-10-CM | POA: Diagnosis not present

## 2019-07-19 DIAGNOSIS — K5900 Constipation, unspecified: Secondary | ICD-10-CM | POA: Diagnosis not present

## 2019-07-19 DIAGNOSIS — E11319 Type 2 diabetes mellitus with unspecified diabetic retinopathy without macular edema: Secondary | ICD-10-CM | POA: Diagnosis not present

## 2019-07-19 DIAGNOSIS — G729 Myopathy, unspecified: Secondary | ICD-10-CM | POA: Diagnosis not present

## 2019-07-19 DIAGNOSIS — F329 Major depressive disorder, single episode, unspecified: Secondary | ICD-10-CM | POA: Diagnosis not present

## 2019-07-19 DIAGNOSIS — K219 Gastro-esophageal reflux disease without esophagitis: Secondary | ICD-10-CM | POA: Diagnosis not present

## 2019-07-19 DIAGNOSIS — I82402 Acute embolism and thrombosis of unspecified deep veins of left lower extremity: Secondary | ICD-10-CM | POA: Diagnosis not present

## 2019-07-19 DIAGNOSIS — G8250 Quadriplegia, unspecified: Secondary | ICD-10-CM | POA: Diagnosis not present

## 2019-07-19 DIAGNOSIS — S14124S Central cord syndrome at C4 level of cervical spinal cord, sequela: Secondary | ICD-10-CM | POA: Diagnosis not present

## 2019-07-19 DIAGNOSIS — F419 Anxiety disorder, unspecified: Secondary | ICD-10-CM | POA: Diagnosis not present

## 2019-07-24 DIAGNOSIS — G729 Myopathy, unspecified: Secondary | ICD-10-CM | POA: Diagnosis not present

## 2019-07-24 DIAGNOSIS — S14124S Central cord syndrome at C4 level of cervical spinal cord, sequela: Secondary | ICD-10-CM | POA: Diagnosis not present

## 2019-07-24 DIAGNOSIS — G8250 Quadriplegia, unspecified: Secondary | ICD-10-CM | POA: Diagnosis not present

## 2019-07-24 DIAGNOSIS — F329 Major depressive disorder, single episode, unspecified: Secondary | ICD-10-CM | POA: Diagnosis not present

## 2019-07-24 DIAGNOSIS — F419 Anxiety disorder, unspecified: Secondary | ICD-10-CM | POA: Diagnosis not present

## 2019-07-24 DIAGNOSIS — K5900 Constipation, unspecified: Secondary | ICD-10-CM | POA: Diagnosis not present

## 2019-07-24 DIAGNOSIS — I82402 Acute embolism and thrombosis of unspecified deep veins of left lower extremity: Secondary | ICD-10-CM | POA: Diagnosis not present

## 2019-07-24 DIAGNOSIS — K219 Gastro-esophageal reflux disease without esophagitis: Secondary | ICD-10-CM | POA: Diagnosis not present

## 2019-07-24 DIAGNOSIS — E11319 Type 2 diabetes mellitus with unspecified diabetic retinopathy without macular edema: Secondary | ICD-10-CM | POA: Diagnosis not present

## 2019-07-25 DIAGNOSIS — E11319 Type 2 diabetes mellitus with unspecified diabetic retinopathy without macular edema: Secondary | ICD-10-CM | POA: Diagnosis not present

## 2019-07-25 DIAGNOSIS — G8250 Quadriplegia, unspecified: Secondary | ICD-10-CM | POA: Diagnosis not present

## 2019-07-25 DIAGNOSIS — F419 Anxiety disorder, unspecified: Secondary | ICD-10-CM | POA: Diagnosis not present

## 2019-07-25 DIAGNOSIS — K5900 Constipation, unspecified: Secondary | ICD-10-CM | POA: Diagnosis not present

## 2019-07-25 DIAGNOSIS — F329 Major depressive disorder, single episode, unspecified: Secondary | ICD-10-CM | POA: Diagnosis not present

## 2019-07-25 DIAGNOSIS — G729 Myopathy, unspecified: Secondary | ICD-10-CM | POA: Diagnosis not present

## 2019-07-25 DIAGNOSIS — S14124S Central cord syndrome at C4 level of cervical spinal cord, sequela: Secondary | ICD-10-CM | POA: Diagnosis not present

## 2019-07-25 DIAGNOSIS — K219 Gastro-esophageal reflux disease without esophagitis: Secondary | ICD-10-CM | POA: Diagnosis not present

## 2019-07-25 DIAGNOSIS — I82402 Acute embolism and thrombosis of unspecified deep veins of left lower extremity: Secondary | ICD-10-CM | POA: Diagnosis not present

## 2019-07-29 DIAGNOSIS — G729 Myopathy, unspecified: Secondary | ICD-10-CM | POA: Diagnosis not present

## 2019-07-29 DIAGNOSIS — F329 Major depressive disorder, single episode, unspecified: Secondary | ICD-10-CM | POA: Diagnosis not present

## 2019-07-29 DIAGNOSIS — S14124S Central cord syndrome at C4 level of cervical spinal cord, sequela: Secondary | ICD-10-CM | POA: Diagnosis not present

## 2019-07-29 DIAGNOSIS — K5900 Constipation, unspecified: Secondary | ICD-10-CM | POA: Diagnosis not present

## 2019-07-29 DIAGNOSIS — K219 Gastro-esophageal reflux disease without esophagitis: Secondary | ICD-10-CM | POA: Diagnosis not present

## 2019-07-29 DIAGNOSIS — I82402 Acute embolism and thrombosis of unspecified deep veins of left lower extremity: Secondary | ICD-10-CM | POA: Diagnosis not present

## 2019-07-29 DIAGNOSIS — F419 Anxiety disorder, unspecified: Secondary | ICD-10-CM | POA: Diagnosis not present

## 2019-07-29 DIAGNOSIS — E11319 Type 2 diabetes mellitus with unspecified diabetic retinopathy without macular edema: Secondary | ICD-10-CM | POA: Diagnosis not present

## 2019-07-29 DIAGNOSIS — G8250 Quadriplegia, unspecified: Secondary | ICD-10-CM | POA: Diagnosis not present

## 2019-07-31 DIAGNOSIS — I82402 Acute embolism and thrombosis of unspecified deep veins of left lower extremity: Secondary | ICD-10-CM | POA: Diagnosis not present

## 2019-07-31 DIAGNOSIS — K5900 Constipation, unspecified: Secondary | ICD-10-CM | POA: Diagnosis not present

## 2019-07-31 DIAGNOSIS — F419 Anxiety disorder, unspecified: Secondary | ICD-10-CM | POA: Diagnosis not present

## 2019-07-31 DIAGNOSIS — G729 Myopathy, unspecified: Secondary | ICD-10-CM | POA: Diagnosis not present

## 2019-07-31 DIAGNOSIS — K219 Gastro-esophageal reflux disease without esophagitis: Secondary | ICD-10-CM | POA: Diagnosis not present

## 2019-07-31 DIAGNOSIS — F329 Major depressive disorder, single episode, unspecified: Secondary | ICD-10-CM | POA: Diagnosis not present

## 2019-07-31 DIAGNOSIS — S14124S Central cord syndrome at C4 level of cervical spinal cord, sequela: Secondary | ICD-10-CM | POA: Diagnosis not present

## 2019-07-31 DIAGNOSIS — E11319 Type 2 diabetes mellitus with unspecified diabetic retinopathy without macular edema: Secondary | ICD-10-CM | POA: Diagnosis not present

## 2019-07-31 DIAGNOSIS — G8250 Quadriplegia, unspecified: Secondary | ICD-10-CM | POA: Diagnosis not present

## 2019-08-05 ENCOUNTER — Other Ambulatory Visit: Payer: Self-pay

## 2019-08-05 DIAGNOSIS — R531 Weakness: Secondary | ICD-10-CM | POA: Diagnosis not present

## 2019-08-05 DIAGNOSIS — S14105D Unspecified injury at C5 level of cervical spinal cord, subsequent encounter: Secondary | ICD-10-CM | POA: Diagnosis not present

## 2019-08-05 DIAGNOSIS — S14103D Unspecified injury at C3 level of cervical spinal cord, subsequent encounter: Secondary | ICD-10-CM | POA: Diagnosis not present

## 2019-08-05 DIAGNOSIS — M21371 Foot drop, right foot: Secondary | ICD-10-CM | POA: Diagnosis not present

## 2019-08-05 DIAGNOSIS — M5431 Sciatica, right side: Secondary | ICD-10-CM

## 2019-08-05 DIAGNOSIS — M21372 Foot drop, left foot: Secondary | ICD-10-CM | POA: Diagnosis not present

## 2019-08-05 DIAGNOSIS — S14104D Unspecified injury at C4 level of cervical spinal cord, subsequent encounter: Secondary | ICD-10-CM | POA: Diagnosis not present

## 2019-08-05 MED ORDER — OXYCODONE HCL 10 MG PO TABS: 10.0000 mg | ORAL_TABLET | Freq: Four times a day (QID) | ORAL | 0 refills | Status: DC

## 2019-08-05 NOTE — Telephone Encounter (Signed)
Patient calling for medication refill Last appointment- VV 07/04/2019 Last refill- 07/02/2019

## 2019-08-06 DIAGNOSIS — K5900 Constipation, unspecified: Secondary | ICD-10-CM | POA: Diagnosis not present

## 2019-08-06 DIAGNOSIS — G8250 Quadriplegia, unspecified: Secondary | ICD-10-CM | POA: Diagnosis not present

## 2019-08-06 DIAGNOSIS — G729 Myopathy, unspecified: Secondary | ICD-10-CM | POA: Diagnosis not present

## 2019-08-06 DIAGNOSIS — S14124S Central cord syndrome at C4 level of cervical spinal cord, sequela: Secondary | ICD-10-CM | POA: Diagnosis not present

## 2019-08-06 DIAGNOSIS — K219 Gastro-esophageal reflux disease without esophagitis: Secondary | ICD-10-CM | POA: Diagnosis not present

## 2019-08-06 DIAGNOSIS — E11319 Type 2 diabetes mellitus with unspecified diabetic retinopathy without macular edema: Secondary | ICD-10-CM | POA: Diagnosis not present

## 2019-08-06 DIAGNOSIS — F329 Major depressive disorder, single episode, unspecified: Secondary | ICD-10-CM | POA: Diagnosis not present

## 2019-08-06 DIAGNOSIS — I82402 Acute embolism and thrombosis of unspecified deep veins of left lower extremity: Secondary | ICD-10-CM | POA: Diagnosis not present

## 2019-08-06 DIAGNOSIS — F419 Anxiety disorder, unspecified: Secondary | ICD-10-CM | POA: Diagnosis not present

## 2019-08-07 DIAGNOSIS — S14109D Unspecified injury at unspecified level of cervical spinal cord, subsequent encounter: Secondary | ICD-10-CM | POA: Diagnosis not present

## 2019-08-08 ENCOUNTER — Other Ambulatory Visit: Payer: Self-pay

## 2019-08-08 DIAGNOSIS — F32A Depression, unspecified: Secondary | ICD-10-CM

## 2019-08-08 DIAGNOSIS — F419 Anxiety disorder, unspecified: Secondary | ICD-10-CM

## 2019-08-08 MED ORDER — CITALOPRAM HYDROBROMIDE 40 MG PO TABS: 40.0000 mg | ORAL_TABLET | Freq: Every day | ORAL | 1 refills | Status: DC

## 2019-08-14 DIAGNOSIS — K219 Gastro-esophageal reflux disease without esophagitis: Secondary | ICD-10-CM | POA: Diagnosis not present

## 2019-08-14 DIAGNOSIS — F329 Major depressive disorder, single episode, unspecified: Secondary | ICD-10-CM | POA: Diagnosis not present

## 2019-08-14 DIAGNOSIS — F419 Anxiety disorder, unspecified: Secondary | ICD-10-CM | POA: Diagnosis not present

## 2019-08-14 DIAGNOSIS — G729 Myopathy, unspecified: Secondary | ICD-10-CM | POA: Diagnosis not present

## 2019-08-14 DIAGNOSIS — S14124S Central cord syndrome at C4 level of cervical spinal cord, sequela: Secondary | ICD-10-CM | POA: Diagnosis not present

## 2019-08-14 DIAGNOSIS — E11319 Type 2 diabetes mellitus with unspecified diabetic retinopathy without macular edema: Secondary | ICD-10-CM | POA: Diagnosis not present

## 2019-08-14 DIAGNOSIS — I82402 Acute embolism and thrombosis of unspecified deep veins of left lower extremity: Secondary | ICD-10-CM | POA: Diagnosis not present

## 2019-08-14 DIAGNOSIS — G8250 Quadriplegia, unspecified: Secondary | ICD-10-CM | POA: Diagnosis not present

## 2019-08-14 DIAGNOSIS — K5900 Constipation, unspecified: Secondary | ICD-10-CM | POA: Diagnosis not present

## 2019-08-16 DIAGNOSIS — G729 Myopathy, unspecified: Secondary | ICD-10-CM | POA: Diagnosis not present

## 2019-08-16 DIAGNOSIS — K5900 Constipation, unspecified: Secondary | ICD-10-CM | POA: Diagnosis not present

## 2019-08-16 DIAGNOSIS — S14124S Central cord syndrome at C4 level of cervical spinal cord, sequela: Secondary | ICD-10-CM | POA: Diagnosis not present

## 2019-08-16 DIAGNOSIS — G8250 Quadriplegia, unspecified: Secondary | ICD-10-CM | POA: Diagnosis not present

## 2019-08-16 DIAGNOSIS — I82402 Acute embolism and thrombosis of unspecified deep veins of left lower extremity: Secondary | ICD-10-CM | POA: Diagnosis not present

## 2019-08-16 DIAGNOSIS — K219 Gastro-esophageal reflux disease without esophagitis: Secondary | ICD-10-CM | POA: Diagnosis not present

## 2019-08-16 DIAGNOSIS — F419 Anxiety disorder, unspecified: Secondary | ICD-10-CM | POA: Diagnosis not present

## 2019-08-16 DIAGNOSIS — F329 Major depressive disorder, single episode, unspecified: Secondary | ICD-10-CM | POA: Diagnosis not present

## 2019-08-16 DIAGNOSIS — E11319 Type 2 diabetes mellitus with unspecified diabetic retinopathy without macular edema: Secondary | ICD-10-CM | POA: Diagnosis not present

## 2019-08-20 DIAGNOSIS — G8250 Quadriplegia, unspecified: Secondary | ICD-10-CM | POA: Diagnosis not present

## 2019-08-20 DIAGNOSIS — S14124S Central cord syndrome at C4 level of cervical spinal cord, sequela: Secondary | ICD-10-CM | POA: Diagnosis not present

## 2019-08-20 DIAGNOSIS — K5900 Constipation, unspecified: Secondary | ICD-10-CM | POA: Diagnosis not present

## 2019-08-20 DIAGNOSIS — F419 Anxiety disorder, unspecified: Secondary | ICD-10-CM | POA: Diagnosis not present

## 2019-08-20 DIAGNOSIS — K219 Gastro-esophageal reflux disease without esophagitis: Secondary | ICD-10-CM | POA: Diagnosis not present

## 2019-08-20 DIAGNOSIS — E11319 Type 2 diabetes mellitus with unspecified diabetic retinopathy without macular edema: Secondary | ICD-10-CM | POA: Diagnosis not present

## 2019-08-20 DIAGNOSIS — I82402 Acute embolism and thrombosis of unspecified deep veins of left lower extremity: Secondary | ICD-10-CM | POA: Diagnosis not present

## 2019-08-20 DIAGNOSIS — F329 Major depressive disorder, single episode, unspecified: Secondary | ICD-10-CM | POA: Diagnosis not present

## 2019-08-20 DIAGNOSIS — G729 Myopathy, unspecified: Secondary | ICD-10-CM | POA: Diagnosis not present

## 2019-08-22 DIAGNOSIS — G8250 Quadriplegia, unspecified: Secondary | ICD-10-CM | POA: Diagnosis not present

## 2019-08-22 DIAGNOSIS — E11319 Type 2 diabetes mellitus with unspecified diabetic retinopathy without macular edema: Secondary | ICD-10-CM | POA: Diagnosis not present

## 2019-08-22 DIAGNOSIS — F419 Anxiety disorder, unspecified: Secondary | ICD-10-CM | POA: Diagnosis not present

## 2019-08-22 DIAGNOSIS — I82402 Acute embolism and thrombosis of unspecified deep veins of left lower extremity: Secondary | ICD-10-CM | POA: Diagnosis not present

## 2019-08-22 DIAGNOSIS — K219 Gastro-esophageal reflux disease without esophagitis: Secondary | ICD-10-CM | POA: Diagnosis not present

## 2019-08-22 DIAGNOSIS — F329 Major depressive disorder, single episode, unspecified: Secondary | ICD-10-CM | POA: Diagnosis not present

## 2019-08-22 DIAGNOSIS — S14124S Central cord syndrome at C4 level of cervical spinal cord, sequela: Secondary | ICD-10-CM | POA: Diagnosis not present

## 2019-08-22 DIAGNOSIS — G729 Myopathy, unspecified: Secondary | ICD-10-CM | POA: Diagnosis not present

## 2019-08-22 DIAGNOSIS — K5900 Constipation, unspecified: Secondary | ICD-10-CM | POA: Diagnosis not present

## 2019-08-29 DIAGNOSIS — S14124S Central cord syndrome at C4 level of cervical spinal cord, sequela: Secondary | ICD-10-CM | POA: Diagnosis not present

## 2019-08-29 DIAGNOSIS — G729 Myopathy, unspecified: Secondary | ICD-10-CM | POA: Diagnosis not present

## 2019-08-29 DIAGNOSIS — F329 Major depressive disorder, single episode, unspecified: Secondary | ICD-10-CM | POA: Diagnosis not present

## 2019-08-29 DIAGNOSIS — K219 Gastro-esophageal reflux disease without esophagitis: Secondary | ICD-10-CM | POA: Diagnosis not present

## 2019-08-29 DIAGNOSIS — K5900 Constipation, unspecified: Secondary | ICD-10-CM | POA: Diagnosis not present

## 2019-08-29 DIAGNOSIS — I82402 Acute embolism and thrombosis of unspecified deep veins of left lower extremity: Secondary | ICD-10-CM | POA: Diagnosis not present

## 2019-08-29 DIAGNOSIS — G8250 Quadriplegia, unspecified: Secondary | ICD-10-CM | POA: Diagnosis not present

## 2019-08-29 DIAGNOSIS — F419 Anxiety disorder, unspecified: Secondary | ICD-10-CM | POA: Diagnosis not present

## 2019-08-29 DIAGNOSIS — E11319 Type 2 diabetes mellitus with unspecified diabetic retinopathy without macular edema: Secondary | ICD-10-CM | POA: Diagnosis not present

## 2019-09-03 ENCOUNTER — Other Ambulatory Visit: Payer: Self-pay

## 2019-09-03 DIAGNOSIS — M5431 Sciatica, right side: Secondary | ICD-10-CM

## 2019-09-03 DIAGNOSIS — F329 Major depressive disorder, single episode, unspecified: Secondary | ICD-10-CM | POA: Diagnosis not present

## 2019-09-03 DIAGNOSIS — I82402 Acute embolism and thrombosis of unspecified deep veins of left lower extremity: Secondary | ICD-10-CM | POA: Diagnosis not present

## 2019-09-03 DIAGNOSIS — S14124S Central cord syndrome at C4 level of cervical spinal cord, sequela: Secondary | ICD-10-CM | POA: Diagnosis not present

## 2019-09-03 DIAGNOSIS — F419 Anxiety disorder, unspecified: Secondary | ICD-10-CM | POA: Diagnosis not present

## 2019-09-03 DIAGNOSIS — G729 Myopathy, unspecified: Secondary | ICD-10-CM | POA: Diagnosis not present

## 2019-09-03 DIAGNOSIS — K5900 Constipation, unspecified: Secondary | ICD-10-CM | POA: Diagnosis not present

## 2019-09-03 DIAGNOSIS — K219 Gastro-esophageal reflux disease without esophagitis: Secondary | ICD-10-CM | POA: Diagnosis not present

## 2019-09-03 DIAGNOSIS — E11319 Type 2 diabetes mellitus with unspecified diabetic retinopathy without macular edema: Secondary | ICD-10-CM | POA: Diagnosis not present

## 2019-09-03 DIAGNOSIS — G8250 Quadriplegia, unspecified: Secondary | ICD-10-CM | POA: Diagnosis not present

## 2019-09-03 MED ORDER — OXYCODONE HCL 10 MG PO TABS: 10.0000 mg | ORAL_TABLET | Freq: Four times a day (QID) | ORAL | 0 refills | Status: DC

## 2019-09-03 NOTE — Telephone Encounter (Signed)
Routing to covering provider. Pt called requesting med refill for oxycodone. Rx pended.   Last OV - 07/04/19 Last written - 0614/21

## 2019-09-05 NOTE — Telephone Encounter (Signed)
Task completed. Left a detailed vm msg for pt regarding med refill sent to the pharmacy. Direct call back info provided.  °

## 2019-09-09 DIAGNOSIS — F419 Anxiety disorder, unspecified: Secondary | ICD-10-CM | POA: Diagnosis not present

## 2019-09-09 DIAGNOSIS — G729 Myopathy, unspecified: Secondary | ICD-10-CM | POA: Diagnosis not present

## 2019-09-09 DIAGNOSIS — E1165 Type 2 diabetes mellitus with hyperglycemia: Secondary | ICD-10-CM | POA: Diagnosis not present

## 2019-09-09 DIAGNOSIS — G8250 Quadriplegia, unspecified: Secondary | ICD-10-CM | POA: Diagnosis not present

## 2019-09-09 DIAGNOSIS — E11319 Type 2 diabetes mellitus with unspecified diabetic retinopathy without macular edema: Secondary | ICD-10-CM | POA: Diagnosis not present

## 2019-09-09 DIAGNOSIS — K5900 Constipation, unspecified: Secondary | ICD-10-CM | POA: Diagnosis not present

## 2019-09-09 DIAGNOSIS — F329 Major depressive disorder, single episode, unspecified: Secondary | ICD-10-CM | POA: Diagnosis not present

## 2019-09-09 DIAGNOSIS — K219 Gastro-esophageal reflux disease without esophagitis: Secondary | ICD-10-CM | POA: Diagnosis not present

## 2019-09-09 DIAGNOSIS — S14124S Central cord syndrome at C4 level of cervical spinal cord, sequela: Secondary | ICD-10-CM | POA: Diagnosis not present

## 2019-09-09 DIAGNOSIS — I82402 Acute embolism and thrombosis of unspecified deep veins of left lower extremity: Secondary | ICD-10-CM | POA: Diagnosis not present

## 2019-09-17 ENCOUNTER — Other Ambulatory Visit: Payer: Self-pay | Admitting: Osteopathic Medicine

## 2019-09-17 ENCOUNTER — Telehealth: Payer: Self-pay

## 2019-09-17 DIAGNOSIS — Z7409 Other reduced mobility: Secondary | ICD-10-CM

## 2019-09-17 DIAGNOSIS — Z789 Other specified health status: Secondary | ICD-10-CM

## 2019-09-17 NOTE — Telephone Encounter (Signed)
[facility] Kathryne Sharper   needs referral for patient. Patient has recently self pay but is now using insurance. Referral pended unable to put office name referral.

## 2019-09-20 ENCOUNTER — Other Ambulatory Visit: Payer: Self-pay | Admitting: Osteopathic Medicine

## 2019-09-26 ENCOUNTER — Other Ambulatory Visit: Payer: Self-pay | Admitting: Osteopathic Medicine

## 2019-09-30 ENCOUNTER — Ambulatory Visit: Payer: Medicare HMO | Admitting: Osteopathic Medicine

## 2019-09-30 DIAGNOSIS — M25511 Pain in right shoulder: Secondary | ICD-10-CM | POA: Diagnosis not present

## 2019-09-30 DIAGNOSIS — Z789 Other specified health status: Secondary | ICD-10-CM | POA: Diagnosis not present

## 2019-09-30 DIAGNOSIS — M25512 Pain in left shoulder: Secondary | ICD-10-CM | POA: Diagnosis not present

## 2019-09-30 DIAGNOSIS — Z7409 Other reduced mobility: Secondary | ICD-10-CM | POA: Diagnosis not present

## 2019-09-30 DIAGNOSIS — R262 Difficulty in walking, not elsewhere classified: Secondary | ICD-10-CM | POA: Diagnosis not present

## 2019-09-30 DIAGNOSIS — M6281 Muscle weakness (generalized): Secondary | ICD-10-CM | POA: Diagnosis not present

## 2019-10-01 ENCOUNTER — Ambulatory Visit (INDEPENDENT_AMBULATORY_CARE_PROVIDER_SITE_OTHER): Payer: Medicare HMO | Admitting: Osteopathic Medicine

## 2019-10-01 ENCOUNTER — Encounter: Payer: Self-pay | Admitting: Osteopathic Medicine

## 2019-10-01 VITALS — BP 156/89 | HR 83

## 2019-10-01 DIAGNOSIS — M958 Other specified acquired deformities of musculoskeletal system: Secondary | ICD-10-CM

## 2019-10-01 DIAGNOSIS — S14124S Central cord syndrome at C4 level of cervical spinal cord, sequela: Secondary | ICD-10-CM

## 2019-10-01 DIAGNOSIS — Z7409 Other reduced mobility: Secondary | ICD-10-CM | POA: Diagnosis not present

## 2019-10-01 DIAGNOSIS — M62838 Other muscle spasm: Secondary | ICD-10-CM | POA: Diagnosis not present

## 2019-10-01 DIAGNOSIS — Z789 Other specified health status: Secondary | ICD-10-CM

## 2019-10-01 DIAGNOSIS — G8252 Quadriplegia, C1-C4 incomplete: Secondary | ICD-10-CM | POA: Diagnosis not present

## 2019-10-01 DIAGNOSIS — L89101 Pressure ulcer of unspecified part of back, stage 1: Secondary | ICD-10-CM

## 2019-10-01 MED ORDER — DIAZEPAM 5 MG PO TABS
ORAL_TABLET | ORAL | 0 refills | Status: DC
Start: 2019-10-01 — End: 2019-10-08

## 2019-10-01 NOTE — Patient Instructions (Addendum)
Plan:  Baclofen 10 mg every evening, maximum dose  Will HOLD Klonopin/clonazepam for now  Instead will START Valium/diazepam - try for one week (see below)  If this isn't helping, I'd like to refer to pain management to discuss other options  Meds ordered this encounter  Medications  . diazepam (VALIUM) 5 MG tablet    Sig: Take 0.5-1 tablet (2.5-5 mg) in morning and afternoon. Can take 1-2 tablets (5-10 mg) qhs [MAX total 4 tablets daily]    Dispense:  30 tablet    Refill:  0

## 2019-10-01 NOTE — Progress Notes (Signed)
Veronica [name] is a 79 y.o. female who presents to  [facility] at [facility]  today, 10/01/19, seeking care for the following:  [name] Muscle spasm - know problem w/ spasticity, she is post injury to cervical spinal cord. Spasm are getting progressively worse at night.  . Caregiver has concern about skin changes on L upper back, ulceration, no drainage, looking better now than it was - see photos  . Has been off her Abilify for a week, but feels better without this. No problems with dizziness.             ASSESSMENT & PLAN with other pertinent findings:  The primary encounter diagnosis was Pressure injury of back, stage 1. Diagnoses of Winged scapula of both sides, Muscle spasticity, Impaired mobility and ADLs, Quadriplegia, C1-C4 incomplete (HCC), and Central cord syndrome at C4 level of cervical spinal cord, sequela (HCC) were also pertinent to this visit.   Mild ulceration to skin - provided more appropriate dressings (curently has telfa pads on this), may consider nutrition consult.   Meds as below for spasticity, would have low threshold for referral to pain management, may be a candidate for botox injections vs other procedure?    Patient Instructions   Plan:  Baclofen 10 mg every evening, maximum dose  Will HOLD Klonopin/clonazepam for now  Instead will START Valium/diazepam - try for one week (see below)  If this isn't helping, I'd like to refer to pain management to discuss other options  Meds ordered this encounter  Medications  . diazepam (VALIUM) 5 MG tablet    Sig: Take 0.5-1 tablet (2.5-5 mg) in morning and afternoon. Can take 1-2 tablets (5-10 mg) qhs [MAX total 4 tablets daily]    Dispense:  30 tablet    Refill:  0      No orders of the defined types were placed in this encounter.   Meds ordered this encounter  Medications  . diazepam (VALIUM) 5 MG tablet    Sig: Take 0.5-1 tablet (2.5-5 mg) in morning and  afternoon. Can take 1-2 tablets (5-10 mg) qhs [MAX total 4 tablets daily]    Dispense:  30 tablet    Refill:  0       Follow-up instructions: Return for VIRTUAL VISIT 1 WEEK TO RECHECK ON NEW MEDICATIONS FOR MUSCLE SPASM .                                         BP (!) 156/89 (BP Location: Left Arm, Patient Position: Sitting)   Pulse 83   SpO2 96%   No outpatient medications have been marked as taking for the 10/01/19 encounter (Office Visit) with Sunnie Nielsen, DO.    No results found for this or any previous visit (from the past 72 hour(s)).  No results found.     All questions at time of visit were answered - patient instructed to contact office with any additional concerns or updates.  ER/RTC precautions were reviewed with the patient as applicable.   Please note: voice recognition software was used to produce this document, and typos may escape review. Please contact Dr. Lyn Hollingshead for any needed clarifications.   Total encounter time: 40 minutes.

## 2019-10-08 ENCOUNTER — Encounter: Payer: Self-pay | Admitting: Osteopathic Medicine

## 2019-10-08 ENCOUNTER — Telehealth: Payer: Medicare HMO | Admitting: Osteopathic Medicine

## 2019-10-08 ENCOUNTER — Telehealth (INDEPENDENT_AMBULATORY_CARE_PROVIDER_SITE_OTHER): Payer: Medicare HMO | Admitting: Osteopathic Medicine

## 2019-10-08 VITALS — BP 124/70 | HR 78 | Ht 66.0 in | Wt 120.0 lb

## 2019-10-08 DIAGNOSIS — G8252 Quadriplegia, C1-C4 incomplete: Secondary | ICD-10-CM | POA: Diagnosis not present

## 2019-10-08 DIAGNOSIS — M5431 Sciatica, right side: Secondary | ICD-10-CM

## 2019-10-08 DIAGNOSIS — M958 Other specified acquired deformities of musculoskeletal system: Secondary | ICD-10-CM

## 2019-10-08 DIAGNOSIS — M25512 Pain in left shoulder: Secondary | ICD-10-CM | POA: Diagnosis not present

## 2019-10-08 DIAGNOSIS — M25511 Pain in right shoulder: Secondary | ICD-10-CM | POA: Diagnosis not present

## 2019-10-08 DIAGNOSIS — S14124S Central cord syndrome at C4 level of cervical spinal cord, sequela: Secondary | ICD-10-CM | POA: Diagnosis not present

## 2019-10-08 DIAGNOSIS — Z789 Other specified health status: Secondary | ICD-10-CM | POA: Diagnosis not present

## 2019-10-08 DIAGNOSIS — M62838 Other muscle spasm: Secondary | ICD-10-CM

## 2019-10-08 DIAGNOSIS — M6281 Muscle weakness (generalized): Secondary | ICD-10-CM | POA: Diagnosis not present

## 2019-10-08 DIAGNOSIS — R262 Difficulty in walking, not elsewhere classified: Secondary | ICD-10-CM | POA: Diagnosis not present

## 2019-10-08 DIAGNOSIS — Z7409 Other reduced mobility: Secondary | ICD-10-CM | POA: Diagnosis not present

## 2019-10-08 MED ORDER — DIAZEPAM 5 MG PO TABS: ORAL_TABLET | ORAL | 2 refills | Status: DC

## 2019-10-08 MED ORDER — OXYCODONE HCL 10 MG PO TABS: 10.0000 mg | ORAL_TABLET | Freq: Four times a day (QID) | ORAL | 0 refills | Status: DC

## 2019-10-08 NOTE — Progress Notes (Signed)
Virtual Visit via Video (App used: Doximity phone) Note  I connected with      Veronica [name] on 10/08/19 at 10:52 AM  by a telemedicine application and verified that I am speaking with the correct person using two identifiers.  Patient is at home I am in office   I discussed the limitations of evaluation and management by telemedicine and the availability of in person appointments. The patient expressed understanding and agreed to proceed.  History of Present Illness: Veronica [name] is a 79 y.o. female who would like to discuss follow up on new medication for muscle spasms.  Patient reports her muscle spasms have significantly improved and have now gone away. She has been taking 2-3 valium 5mg  per day for spasms. She also states the ulcers on her shoulders blades are improving. She needs refills on valium and oxycodone. She denies any other concerns or questions today.       Observations/Objective: BP 124/70   Pulse 78   Ht 5\' 6"  (1.676 m)   Wt 120 lb (54.4 kg)   BMI 19.37 kg/m  BP Readings from Last 3 Encounters:  10/08/19 124/70  10/01/19 (!) 156/89  07/04/19 110/72   Exam: Normal Speech.   Lab and Radiology Results No results found for this or any previous visit (from the past 72 hour(s)). No results found.     Assessment and Plan: 79 y.o. female with The primary encounter diagnosis was Quadriplegia, C1-C4 incomplete (HCC). Diagnoses of Sciatica of right side, Winged scapula of both sides, Muscle spasticity, and Central cord syndrome at C4 level of cervical spinal cord, sequela (HCC) were also pertinent to this visit.  Muscle spasms - Doing well at current dose of valium. Will refill today. - Doing well at current dose of oxycodone. Will refill today. - If patient has new or worsening pain/muscle spasms, would have low threshold to refer to pain management for additional treatment options.  PDMP reviewed during this encounter. No orders of the defined types  were placed in this encounter.  Meds ordered this encounter  Medications  . diazepam (VALIUM) 5 MG tablet    Sig: Take 0.5-1 tablet (2.5-5 mg) in morning and afternoon. Can take 1-2 tablets (5-10 mg) qhs [MAX total 4 tablets daily, #120 for 30 days]    Dispense:  120 tablet    Refill:  2  . Oxycodone HCl 10 MG TABS    Sig: Take 1 tablet (10 mg total) by mouth every 6 (six) hours.    Dispense:  120 tablet    Refill:  0    CANCEL RX FOR 5 MG TABLETS, SENT 03/18/2019       Follow Up Instructions: Return in about 3 months (around 01/08/2020) for maintain opiate pain rx, see me sooner if needed. Virtual visit ok. .    I discussed the assessment and treatment plan with the patient. The patient was provided an opportunity to ask questions and all were answered. The patient agreed with the plan and demonstrated an understanding of the instructions.   The patient was advised to call back or seek an in-person evaluation if any new concerns, if symptoms worsen or if the condition fails to improve as anticipated.  20 minutes of non-face-to-face time was provided during this encounter.      . . . . . . . . . . . . . 03/20/2019                   Historical  information moved to improve visibility of documentation.  Past Medical History:  Diagnosis Date  . Anemia   . Diabetic retinopathy (HCC) 03/23/2015  . GERD (gastroesophageal reflux disease) 01/05/2015  . Hypothyroid   . Osteopenia 03/03/2014  . Type 2 diabetes mellitus with microalbuminuria or microproteinuria 03/03/2014   Actos added January 2016.     Past Surgical History:  Procedure Laterality Date  . APPENDECTOMY  02/28/11  . CESAREAN SECTION    . LAPAROSCOPIC APPENDECTOMY  03/03/2011   Procedure: APPENDECTOMY LAPAROSCOPIC;  Surgeon: Valarie Merino, MD;  Location: [facility];  Service: General;  Laterality: N/A;  . TONSILLECTOMY     Social History   Tobacco Use  . Smoking status: Never Smoker  . Smokeless  tobacco: Never Used  Substance Use Topics  . Alcohol use: No   family history includes Cancer in her father and mother; Diabetes in her mother; Hypertension in her mother; Stroke in her mother.  Medications: Current Outpatient Medications  Medication Sig Dispense Refill  . Accu-Chek Softclix Lancets lancets Use as instructed up to qid 200 each 99  . acetaminophen (TYLENOL) 500 MG tablet Take by mouth.    . Alcohol Swabs PADS Per insurance coverage, use as directed w/ glucose testing 120 each 99  . AMBULATORY NON FORMULARY MEDICATION Medication Name: purwick external urinary catheter. Sig: apply externally as directed PRN urination 100 Units 99  . AMBULATORY NON FORMULARY MEDICATION Medication Name: AFO evaluation Patient's phone number: [phone] Device 0  . AMBULATORY NON FORMULARY MEDICATION Medication Name: resting hand splint bilateral Dx quadriplegia w/ spasticity, wrist pain, carpal tunnel 2 Units prn  . baclofen (LIORESAL) 10 MG tablet TAKE 1/2 TABLET (5 MG) BY MOUTH 3 (THREE) TIMES DAILY AS NEEDED. 90 tablet 1  . bisacodyl (DULCOLAX) 10 MG suppository Place rectally.    . citalopram (CELEXA) 40 MG tablet Take 1 tablet (40 mg total) by mouth daily. 90 tablet 1  . diazepam (VALIUM) 5 MG tablet Take 0.5-1 tablet (2.5-5 mg) in morning and afternoon. Can take 1-2 tablets (5-10 mg) qhs [MAX total 4 tablets daily, #120 for 30 days] 120 tablet 2  . ELIQUIS 5 MG TABS tablet Take 1 tablet (5 mg total) by mouth 2 (two) times daily. 180 tablet 3  . FIASP FLEXTOUCH 100 UNIT/ML FlexTouch Pen     . gabapentin (NEURONTIN) 300 MG capsule TAKE 2-3 CAPSULES (600-900 MG TOTAL) BY MOUTH 3 (THREE) TIMES DAILY. (Patient taking differently: Take 100 mg by mouth 3 (three) times daily. ) 270 capsule 2  . glucose blood test strip Use as instructed up to qid 200 each 99  . insulin aspart (FIASP FLEXTOUCH) 100 UNIT/ML FlexPen Inject into the skin.    [name] insulin degludec (TRESIBA) 100 UNIT/ML SOPN FlexTouch  Pen Inject into the skin.    [name] levothyroxine (SYNTHROID, LEVOTHROID) 88 MCG tablet Take 1 tablet (88 mcg total) by mouth daily. FOLLOWING WITH ENDOCRINOLOGY DR LEVY 1 tablet 0  . promethazine (PHENERGAN) 25 MG tablet Take 1 tablet (25 mg total) by mouth every 6 (six) hours as needed for nausea or vomiting. 30 tablet 1  . Oxycodone HCl 10 MG TABS Take 1 tablet (10 mg total) by mouth every 6 (six) hours. 120 tablet 0   No current facility-administered medications for this visit.   Allergies  Allergen Reactions  . Dulaglutide Palpitations    SOB. N/V  . Cephalexin Other (See Comments)    hives   . Codeine Hives and Nausea And Vomiting  .  Duloxetine Other (See Comments)    Suicidal Thoughts  . Glimepiride Hives  . Metformin Other (See Comments)    Dizzy  . Other Other (See Comments)    All oral diabetic drugs: nausea, vomiting, dizziness Unknown antibiotic: "starts with a P" - rash  . Penicillin G Itching  . Penicillins Rash  . Sitagliptin Other (See Comments)    Insomnia   . Sulfamethoxazole-Trimethoprim Hives  . Pseudoephedrine Hcl   . Sulfa Antibiotics Hives and Nausea And Vomiting  . Sulfamethoxazole Itching  . Ezetimibe   . Gabapentin Other (See Comments)    Feels out of it  . Guaifenesin   . Metformin And Related     diarrhea                                                                     Rutha Bouchard, [facility] MS3

## 2019-10-14 DIAGNOSIS — M25512 Pain in left shoulder: Secondary | ICD-10-CM | POA: Diagnosis not present

## 2019-10-14 DIAGNOSIS — M6281 Muscle weakness (generalized): Secondary | ICD-10-CM | POA: Diagnosis not present

## 2019-10-14 DIAGNOSIS — Z7409 Other reduced mobility: Secondary | ICD-10-CM | POA: Diagnosis not present

## 2019-10-14 DIAGNOSIS — Z789 Other specified health status: Secondary | ICD-10-CM | POA: Diagnosis not present

## 2019-10-14 DIAGNOSIS — M25511 Pain in right shoulder: Secondary | ICD-10-CM | POA: Diagnosis not present

## 2019-10-14 DIAGNOSIS — R262 Difficulty in walking, not elsewhere classified: Secondary | ICD-10-CM | POA: Diagnosis not present

## 2019-10-30 DIAGNOSIS — M6281 Muscle weakness (generalized): Secondary | ICD-10-CM | POA: Diagnosis not present

## 2019-10-30 DIAGNOSIS — M25512 Pain in left shoulder: Secondary | ICD-10-CM | POA: Diagnosis not present

## 2019-10-30 DIAGNOSIS — Z789 Other specified health status: Secondary | ICD-10-CM | POA: Diagnosis not present

## 2019-10-30 DIAGNOSIS — Z7409 Other reduced mobility: Secondary | ICD-10-CM | POA: Diagnosis not present

## 2019-10-30 DIAGNOSIS — R262 Difficulty in walking, not elsewhere classified: Secondary | ICD-10-CM | POA: Diagnosis not present

## 2019-10-30 DIAGNOSIS — M25511 Pain in right shoulder: Secondary | ICD-10-CM | POA: Diagnosis not present

## 2019-11-04 ENCOUNTER — Other Ambulatory Visit: Payer: Self-pay | Admitting: Osteopathic Medicine

## 2019-11-04 DIAGNOSIS — Z794 Long term (current) use of insulin: Secondary | ICD-10-CM | POA: Diagnosis not present

## 2019-11-04 DIAGNOSIS — R809 Proteinuria, unspecified: Secondary | ICD-10-CM | POA: Diagnosis not present

## 2019-11-04 DIAGNOSIS — E039 Hypothyroidism, unspecified: Secondary | ICD-10-CM | POA: Diagnosis not present

## 2019-11-04 DIAGNOSIS — E1129 Type 2 diabetes mellitus with other diabetic kidney complication: Secondary | ICD-10-CM | POA: Diagnosis not present

## 2019-11-05 ENCOUNTER — Other Ambulatory Visit: Payer: Self-pay

## 2019-11-05 DIAGNOSIS — Z789 Other specified health status: Secondary | ICD-10-CM | POA: Diagnosis not present

## 2019-11-05 DIAGNOSIS — M25511 Pain in right shoulder: Secondary | ICD-10-CM | POA: Diagnosis not present

## 2019-11-05 DIAGNOSIS — M6281 Muscle weakness (generalized): Secondary | ICD-10-CM | POA: Diagnosis not present

## 2019-11-05 DIAGNOSIS — M25512 Pain in left shoulder: Secondary | ICD-10-CM | POA: Diagnosis not present

## 2019-11-05 DIAGNOSIS — M5431 Sciatica, right side: Secondary | ICD-10-CM

## 2019-11-05 DIAGNOSIS — Z7409 Other reduced mobility: Secondary | ICD-10-CM | POA: Diagnosis not present

## 2019-11-05 DIAGNOSIS — R262 Difficulty in walking, not elsewhere classified: Secondary | ICD-10-CM | POA: Diagnosis not present

## 2019-11-05 MED ORDER — OXYCODONE HCL 10 MG PO TABS: 10.0000 mg | ORAL_TABLET | Freq: Four times a day (QID) | ORAL | 0 refills | Status: DC

## 2019-11-05 NOTE — Telephone Encounter (Signed)
Last refill- 10/08/2019 Last ov- 10/01/2019

## 2019-11-11 DIAGNOSIS — R262 Difficulty in walking, not elsewhere classified: Secondary | ICD-10-CM | POA: Diagnosis not present

## 2019-11-11 DIAGNOSIS — Z789 Other specified health status: Secondary | ICD-10-CM | POA: Diagnosis not present

## 2019-11-11 DIAGNOSIS — M6281 Muscle weakness (generalized): Secondary | ICD-10-CM | POA: Diagnosis not present

## 2019-11-11 DIAGNOSIS — Z7409 Other reduced mobility: Secondary | ICD-10-CM | POA: Diagnosis not present

## 2019-11-11 DIAGNOSIS — M25511 Pain in right shoulder: Secondary | ICD-10-CM | POA: Diagnosis not present

## 2019-11-11 DIAGNOSIS — M25512 Pain in left shoulder: Secondary | ICD-10-CM | POA: Diagnosis not present

## 2019-11-25 ENCOUNTER — Telehealth: Payer: Self-pay

## 2019-11-25 NOTE — Telephone Encounter (Signed)
Patient called in to office requesting 100 mg Gabapentin be sent to pharmacy. She says the 300 mg gabapentin is strong for her. Please advise

## 2019-11-26 MED ORDER — GABAPENTIN 100 MG PO CAPS: 100.0000 mg | ORAL_CAPSULE | Freq: Three times a day (TID) | ORAL | 3 refills | Status: AC

## 2019-12-10 DIAGNOSIS — E1165 Type 2 diabetes mellitus with hyperglycemia: Secondary | ICD-10-CM | POA: Diagnosis not present

## 2019-12-20 ENCOUNTER — Other Ambulatory Visit: Payer: Self-pay

## 2019-12-20 MED ORDER — DIAZEPAM 5 MG PO TABS: ORAL_TABLET | ORAL | 0 refills | Status: DC

## 2019-12-23 ENCOUNTER — Other Ambulatory Visit: Payer: Self-pay | Admitting: Osteopathic Medicine

## 2019-12-23 DIAGNOSIS — M5431 Sciatica, right side: Secondary | ICD-10-CM

## 2019-12-23 DIAGNOSIS — F32A Depression, unspecified: Secondary | ICD-10-CM

## 2019-12-23 MED ORDER — OXYCODONE HCL 10 MG PO TABS: 10.0000 mg | ORAL_TABLET | Freq: Four times a day (QID) | ORAL | 0 refills | Status: DC

## 2020-01-20 ENCOUNTER — Telehealth (INDEPENDENT_AMBULATORY_CARE_PROVIDER_SITE_OTHER): Payer: Medicare HMO | Admitting: Osteopathic Medicine

## 2020-01-20 ENCOUNTER — Encounter: Payer: Self-pay | Admitting: Osteopathic Medicine

## 2020-01-20 VITALS — BP 125/72 | HR 75 | Temp 97.6°F

## 2020-01-20 DIAGNOSIS — J019 Acute sinusitis, unspecified: Secondary | ICD-10-CM | POA: Diagnosis not present

## 2020-01-20 MED ORDER — IPRATROPIUM BROMIDE 0.06 % NA SOLN: 2.0000 | Freq: Four times a day (QID) | NASAL | 1 refills | Status: DC

## 2020-01-20 MED ORDER — DOXYCYCLINE HYCLATE 100 MG PO TABS: 100.0000 mg | ORAL_TABLET | Freq: Two times a day (BID) | ORAL | 0 refills | Status: DC

## 2020-01-20 MED ORDER — ONDANSETRON 8 MG PO TBDP: 8.0000 mg | ORAL_TABLET | Freq: Three times a day (TID) | ORAL | 3 refills | Status: AC | PRN

## 2020-01-20 NOTE — Progress Notes (Signed)
Telemedicine Visit via  Audio only - telephone (patient preference)  I connected with [name] on 01/20/20 at 3:18 PM  by phone or  telemedicine application as noted above  I verified that I am speaking with or regarding  the correct patient using two identifiers.  Participants: Myself, Dr Sunnie Nielsen DO Patient: Veronica [name] Patient proxy if applicable: none Other, if applicable: none  Patient is in separate location from myself  I am in office at [facility]    I discussed the limitations of evaluation and management  by telemedicine and the availability of in person appointments.  The participant(s) above expressed understanding and  agreed to proceed with this appointment via telemedicine.       History of Present Illness: Veronica [name] is a 79 y.o. female who would like to discuss feeling sick - URI   Symptoms for 5 days Yellow/green drainage from nasal passages Pressure in eye and ears bilaterally Equilibrium off, nausea, headache Not taken any otc meds Fever - no  Lower respiratory - no cough, no SOB  Sore throat - no  No other sinus problems in the past month  No one close to her has had symptoms  No recent travel    Observations/Objective: BP 125/72   Pulse 75   Temp 97.6 F (36.4 C)  BP Readings from Last 3 Encounters:  01/20/20 125/72  10/08/19 124/70  10/01/19 (!) 156/89   Exam: Normal Speech.  NAD Speaking in full sentences and voice normal compared to her usual   Lab and Radiology Results No results found for this or any previous visit (from the past 72 hour(s)). No results found.     Assessment and Plan: 79 y.o. female with The encounter diagnosis was Acute non-recurrent sinusitis, unspecified location.   PDMP not reviewed this encounter. No orders of the defined types were placed in this encounter.  Meds ordered this encounter  Medications  . doxycycline (VIBRA-TABS) 100 MG tablet    Sig: Take 1  tablet (100 mg total) by mouth 2 (two) times daily.    Dispense:  10 tablet    Refill:  0  . ipratropium (ATROVENT) 0.06 % nasal spray    Sig: Place 2 sprays into both nostrils 4 (four) times daily. As needed for runny nose / postnasal drip    Dispense:  15 mL    Refill:  1  . ondansetron (ZOFRAN-ODT) 8 MG disintegrating tablet    Sig: Take 1 tablet (8 mg total) by mouth every 8 (eight) hours as needed for nausea.    Dispense:  20 tablet    Refill:  3      Follow Up Instructions: Return if symptoms worsen or fail to improve.    I discussed the assessment and treatment plan with the patient. The patient was provided an opportunity to ask questions and all were answered. The patient agreed with the plan and demonstrated an understanding of the instructions.   The patient was advised to call back or seek an in-person evaluation if any new concerns, if symptoms worsen or if the condition fails to improve as anticipated.  21 minutes of non-face-to-face time was provided during this encounter.      . . . . . . . . . . . . . Veronica [name]                   Historical information moved to improve visibility of documentation.  Past Medical History:  Diagnosis Date  .  Anemia   . Diabetic retinopathy (HCC) 03/23/2015  . GERD (gastroesophageal reflux disease) 01/05/2015  . Hypothyroid   . Osteopenia 03/03/2014  . Type 2 diabetes mellitus with microalbuminuria or microproteinuria 03/03/2014   Actos added January 2016.     Past Surgical History:  Procedure Laterality Date  . APPENDECTOMY  02/28/11  . CESAREAN SECTION    . LAPAROSCOPIC APPENDECTOMY  03/03/2011   Procedure: APPENDECTOMY LAPAROSCOPIC;  Surgeon: Veronica Merino, MD;  Location: [facility];  Service: General;  Laterality: N/A;  . TONSILLECTOMY     Social History   Tobacco Use  . Smoking status: Never Smoker  . Smokeless tobacco: Never Used  Substance Use Topics  . Alcohol use: No   family history  includes Cancer in her father and mother; Diabetes in her mother; Hypertension in her mother; Stroke in her mother.  Medications: Current Outpatient Medications  Medication Sig Dispense Refill  . Accu-Chek Softclix Lancets lancets Use as instructed up to qid 200 each 99  . acetaminophen (TYLENOL) 500 MG tablet Take by mouth.    . Alcohol Swabs PADS Per insurance coverage, use as directed w/ glucose testing 120 each 99  . AMBULATORY NON FORMULARY MEDICATION Medication Name: purwick external urinary catheter. Sig: apply externally as directed PRN urination 100 Units 99  . AMBULATORY NON FORMULARY MEDICATION Medication Name: AFO evaluation Patient's phone number: [phone] Device 0  . AMBULATORY NON FORMULARY MEDICATION Medication Name: resting hand splint bilateral Dx quadriplegia w/ spasticity, wrist pain, carpal tunnel 2 Units prn  . baclofen (LIORESAL) 10 MG tablet TAKE 1/2 TABLET (5 MG) BY MOUTH 3 (THREE) TIMES DAILY AS NEEDED. 90 tablet 1  . bisacodyl (DULCOLAX) 10 MG suppository Place rectally.    . citalopram (CELEXA) 40 MG tablet TAKE 1 TABLET BY MOUTH EVERY DAY 90 tablet 1  . diazepam (VALIUM) 5 MG tablet Take 0.5-1 tablet (2.5-5 mg) in morning and afternoon. Can take 1-2 tablets (5-10 mg) qhs [MAX total 4 tablets daily, #120 for 30 days] 120 tablet 0  . doxycycline (VIBRA-TABS) 100 MG tablet Take 1 tablet (100 mg total) by mouth 2 (two) times daily. 10 tablet 0  . ELIQUIS 5 MG TABS tablet Take 1 tablet (5 mg total) by mouth 2 (two) times daily. 180 tablet 3  . FIASP FLEXTOUCH 100 UNIT/ML FlexTouch Pen     . gabapentin (NEURONTIN) 100 MG capsule Take 1 capsule (100 mg total) by mouth 3 (three) times daily. 270 capsule 3  . glucose blood test strip Use as instructed up to qid 200 each 99  . insulin aspart (FIASP FLEXTOUCH) 100 UNIT/ML FlexPen Inject into the skin.    Veronica [name] insulin degludec (TRESIBA) 100 UNIT/ML SOPN FlexTouch Pen Inject into the skin.    Veronica [name] ipratropium (ATROVENT) 0.06 %  nasal spray Place 2 sprays into both nostrils 4 (four) times daily. As needed for runny nose / postnasal drip 15 mL 1  . levothyroxine (SYNTHROID, LEVOTHROID) 88 MCG tablet Take 1 tablet (88 mcg total) by mouth daily. FOLLOWING WITH ENDOCRINOLOGY DR LEVY 1 tablet 0  . ondansetron (ZOFRAN-ODT) 8 MG disintegrating tablet Take 1 tablet (8 mg total) by mouth every 8 (eight) hours as needed for nausea. 20 tablet 3  . Oxycodone HCl 10 MG TABS Take 1 tablet (10 mg total) by mouth every 6 (six) hours. 120 tablet 0  . promethazine (PHENERGAN) 25 MG tablet Take 1 tablet (25 mg total) by mouth every 6 (six) hours as needed for nausea or  vomiting. 30 tablet 1   No current facility-administered medications for this visit.   Allergies  Allergen Reactions  . Dulaglutide Palpitations    SOB. N/V  . Cephalexin Other (See Comments)    hives   . Codeine Hives and Nausea And Vomiting  . Duloxetine Other (See Comments)    Suicidal Thoughts  . Glimepiride Hives  . Metformin Other (See Comments)    Dizzy  . Other Other (See Comments)    All oral diabetic drugs: nausea, vomiting, dizziness Unknown antibiotic: "starts with a P" - rash  . Penicillin G Itching  . Penicillins Rash  . Sitagliptin Other (See Comments)    Insomnia   . Sulfamethoxazole-Trimethoprim Hives  . Pseudoephedrine Hcl   . Sulfa Antibiotics Hives and Nausea And Vomiting  . Sulfamethoxazole Itching  . Ezetimibe   . Gabapentin Other (See Comments)    Feels out of it  . Guaifenesin   . Metformin And Related     diarrhea

## 2020-01-20 NOTE — Progress Notes (Signed)
Symptoms for 5 days Yellow/green drainage Pressure in eye and ears Equilibrium off Nausea Not taken any otc meds

## 2020-01-30 ENCOUNTER — Other Ambulatory Visit: Payer: Self-pay | Admitting: Physician Assistant

## 2020-01-30 ENCOUNTER — Telehealth: Payer: Self-pay

## 2020-01-30 DIAGNOSIS — M5431 Sciatica, right side: Secondary | ICD-10-CM

## 2020-01-30 MED ORDER — OXYCODONE HCL 10 MG PO TABS: 10.0000 mg | ORAL_TABLET | Freq: Four times a day (QID) | ORAL | 0 refills | Status: AC

## 2020-01-30 NOTE — Telephone Encounter (Signed)
Last written 12/20/2019 #120 no refills Last appt 01/20/2020

## 2020-01-30 NOTE — Telephone Encounter (Signed)
Pt called requesting med refill for hydrocodone. Rx not listed in active med list. Pls send rx to [facility] pharmacy.

## 2020-02-04 DIAGNOSIS — E039 Hypothyroidism, unspecified: Secondary | ICD-10-CM | POA: Diagnosis not present

## 2020-02-04 DIAGNOSIS — Z794 Long term (current) use of insulin: Secondary | ICD-10-CM | POA: Diagnosis not present

## 2020-02-04 DIAGNOSIS — E1129 Type 2 diabetes mellitus with other diabetic kidney complication: Secondary | ICD-10-CM | POA: Diagnosis not present

## 2020-02-04 DIAGNOSIS — E785 Hyperlipidemia, unspecified: Secondary | ICD-10-CM | POA: Diagnosis not present

## 2020-02-04 DIAGNOSIS — R809 Proteinuria, unspecified: Secondary | ICD-10-CM | POA: Diagnosis not present

## 2020-02-04 DIAGNOSIS — E1169 Type 2 diabetes mellitus with other specified complication: Secondary | ICD-10-CM | POA: Diagnosis not present

## 2020-02-11 ENCOUNTER — Other Ambulatory Visit: Payer: Self-pay | Admitting: Osteopathic Medicine

## 2020-02-13 ENCOUNTER — Encounter: Payer: Self-pay | Admitting: Osteopathic Medicine

## 2020-02-13 ENCOUNTER — Ambulatory Visit (INDEPENDENT_AMBULATORY_CARE_PROVIDER_SITE_OTHER): Payer: Medicare HMO | Admitting: Osteopathic Medicine

## 2020-02-13 ENCOUNTER — Other Ambulatory Visit: Payer: Self-pay

## 2020-02-13 VITALS — BP 152/96 | HR 93

## 2020-02-13 DIAGNOSIS — F32A Depression, unspecified: Secondary | ICD-10-CM

## 2020-02-13 DIAGNOSIS — F419 Anxiety disorder, unspecified: Secondary | ICD-10-CM

## 2020-02-13 DIAGNOSIS — S00412A Abrasion of left ear, initial encounter: Secondary | ICD-10-CM | POA: Diagnosis not present

## 2020-02-13 MED ORDER — ARIPIPRAZOLE 5 MG PO TABS: 5.0000 mg | ORAL_TABLET | Freq: Every day | ORAL | 1 refills | Status: AC

## 2020-02-13 NOTE — Patient Instructions (Signed)
Plan: Will trial adding Abilify 5 mg for anti-depression  You were on this medication for some time, so I don't think this caused suicidally. HOWEVER if you experience worse moods or increased thoughts of suicide with this medication, please STOP it and let me know right away / seek emergency medical attention.

## 2020-02-15 ENCOUNTER — Encounter: Payer: Self-pay | Admitting: Osteopathic Medicine

## 2020-02-15 NOTE — Progress Notes (Signed)
Veronica [name] is a 79 y.o. female who presents to  [facility] at [facility]  today, 02/13/20, seeking care for the following:  . Worsening depression . Concern for ear - one of her caretakers was cleaning ears and there was a significant amount of blood... on exam, there is dried blood in lower L canal but TM normal, L canal clear and TM WNL.      ASSESSMENT & PLAN with other pertinent findings:  The primary encounter diagnosis was Abrasion of left ear canal, initial encounter. A diagnosis of Anxiety and depression was also pertinent to this visit.   No results found for this or any previous visit (from the past 24 hour(s)).     Patient Instructions  Plan: Will trial adding Abilify 5 mg for anti-depression  You were on this medication for some time, so I don't think this caused suicidally. HOWEVER if you experience worse moods or increased thoughts of suicide with this medication, please STOP it and let me know right away / seek emergency medical attention.     No orders of the defined types were placed in this encounter.   Meds ordered this encounter  Medications  . ARIPiprazole (ABILIFY) 5 MG tablet    Sig: Take 1 tablet (5 mg total) by mouth daily. Start at 1/2 tablet (2.5 mg total) po daily for first week    Dispense:  30 tablet    Refill:  1       Follow-up instructions: Return in about 4 weeks (around 03/12/2020) for VIRTUAL *OR* IN-OFFICE VISIT, RECHECK ON NEW MEDICATION.                                         BP (!) 152/96   Pulse 93   Current Meds  Medication Sig  . Accu-Chek Softclix Lancets lancets Use as instructed up to qid  . acetaminophen (TYLENOL) 500 MG tablet Take by mouth.  . Alcohol Swabs PADS Per insurance coverage, use as directed w/ glucose testing  . AMBULATORY NON FORMULARY MEDICATION Medication Name: purwick external urinary catheter. Sig: apply externally  as directed PRN urination  . AMBULATORY NON FORMULARY MEDICATION Medication Name: AFO evaluation Patient's phone number: [phone]  . AMBULATORY NON FORMULARY MEDICATION Medication Name: resting hand splint bilateral Dx quadriplegia w/ spasticity, wrist pain, carpal tunnel  . baclofen (LIORESAL) 10 MG tablet TAKE 1/2 TABLET (5 MG) BY MOUTH 3 (THREE) TIMES DAILY AS NEEDED.  . bisacodyl (DULCOLAX) 10 MG suppository Place rectally.  . citalopram (CELEXA) 40 MG tablet TAKE 1 TABLET BY MOUTH EVERY DAY  . diazepam (VALIUM) 5 MG tablet TAKE 1/2-1 TAB BY MOUTH IN AM AND PM. CAN TAKE 2TABS AT BEDTIME. MAX 4 DAILY.  [name] ELIQUIS 5 MG TABS tablet Take 1 tablet (5 mg total) by mouth 2 (two) times daily.  [name] FIASP FLEXTOUCH 100 UNIT/ML FlexTouch Pen   . gabapentin (NEURONTIN) 100 MG capsule Take 1 capsule (100 mg total) by mouth 3 (three) times daily.  [name] glucose blood test strip Use as instructed up to qid  . insulin aspart (NOVOLOG) 100 UNIT/ML FlexPen Inject into the skin.  [name] insulin degludec (TRESIBA) 100 UNIT/ML SOPN FlexTouch Pen Inject into the skin.  [name] ipratropium (ATROVENT) 0.06 % nasal spray PLACE 2 SPRAYS INTO BOTH NOSTRILS 4 (FOUR) TIMES DAILY. AS NEEDED FOR RUNNY NOSE / POSTNASAL DRIP  .  levothyroxine (SYNTHROID, LEVOTHROID) 88 MCG tablet Take 1 tablet (88 mcg total) by mouth daily. FOLLOWING WITH ENDOCRINOLOGY DR LEVY  . ondansetron (ZOFRAN-ODT) 8 MG disintegrating tablet Take 1 tablet (8 mg total) by mouth every 8 (eight) hours as needed for nausea.  . Oxycodone HCl 10 MG TABS Take 1 tablet (10 mg total) by mouth every 6 (six) hours.  . promethazine (PHENERGAN) 25 MG tablet Take 1 tablet (25 mg total) by mouth every 6 (six) hours as needed for nausea or vomiting.  . [DISCONTINUED] doxycycline (VIBRA-TABS) 100 MG tablet Take 1 tablet (100 mg total) by mouth 2 (two) times daily.    No results found for this or any previous visit (from the past 72 hour(s)).  No results found.     All  questions at time of visit were answered - patient instructed to contact office with any additional concerns or updates.  ER/RTC precautions were reviewed with the patient as applicable.   Please note: voice recognition software was used to produce this document, and typos may escape review. Please contact Dr. Lyn Hollingshead for any needed clarifications.

## 2020-03-07 ENCOUNTER — Other Ambulatory Visit: Payer: Self-pay | Admitting: Osteopathic Medicine

## 2020-03-07 DIAGNOSIS — I959 Hypotension, unspecified: Secondary | ICD-10-CM | POA: Diagnosis not present

## 2020-03-07 DIAGNOSIS — E038 Other specified hypothyroidism: Secondary | ICD-10-CM | POA: Diagnosis not present

## 2020-03-07 DIAGNOSIS — E1129 Type 2 diabetes mellitus with other diabetic kidney complication: Secondary | ICD-10-CM | POA: Diagnosis not present

## 2020-03-07 DIAGNOSIS — R0602 Shortness of breath: Secondary | ICD-10-CM | POA: Diagnosis not present

## 2020-03-07 DIAGNOSIS — J1282 Pneumonia due to coronavirus disease 2019: Secondary | ICD-10-CM | POA: Diagnosis not present

## 2020-03-07 DIAGNOSIS — D6869 Other thrombophilia: Secondary | ICD-10-CM | POA: Diagnosis not present

## 2020-03-07 DIAGNOSIS — K5909 Other constipation: Secondary | ICD-10-CM | POA: Diagnosis not present

## 2020-03-07 DIAGNOSIS — Z452 Encounter for adjustment and management of vascular access device: Secondary | ICD-10-CM | POA: Diagnosis not present

## 2020-03-07 DIAGNOSIS — J9601 Acute respiratory failure with hypoxia: Secondary | ICD-10-CM | POA: Diagnosis not present

## 2020-03-07 DIAGNOSIS — R9431 Abnormal electrocardiogram [ECG] [EKG]: Secondary | ICD-10-CM | POA: Diagnosis not present

## 2020-03-07 DIAGNOSIS — G8252 Quadriplegia, C1-C4 incomplete: Secondary | ICD-10-CM | POA: Diagnosis not present

## 2020-03-07 DIAGNOSIS — R809 Proteinuria, unspecified: Secondary | ICD-10-CM | POA: Diagnosis not present

## 2020-03-07 DIAGNOSIS — Z7189 Other specified counseling: Secondary | ICD-10-CM | POA: Diagnosis not present

## 2020-03-07 DIAGNOSIS — A419 Sepsis, unspecified organism: Secondary | ICD-10-CM | POA: Diagnosis not present

## 2020-03-07 DIAGNOSIS — R918 Other nonspecific abnormal finding of lung field: Secondary | ICD-10-CM | POA: Diagnosis not present

## 2020-03-07 DIAGNOSIS — N179 Acute kidney failure, unspecified: Secondary | ICD-10-CM | POA: Diagnosis not present

## 2020-03-07 DIAGNOSIS — R0902 Hypoxemia: Secondary | ICD-10-CM | POA: Diagnosis not present

## 2020-03-07 DIAGNOSIS — E872 Acidosis: Secondary | ICD-10-CM | POA: Diagnosis not present

## 2020-03-07 DIAGNOSIS — U071 COVID-19: Secondary | ICD-10-CM | POA: Diagnosis not present

## 2020-03-07 DIAGNOSIS — R509 Fever, unspecified: Secondary | ICD-10-CM | POA: Diagnosis not present

## 2020-03-07 DIAGNOSIS — R6521 Severe sepsis with septic shock: Secondary | ICD-10-CM | POA: Diagnosis not present

## 2020-03-11 ENCOUNTER — Other Ambulatory Visit: Payer: Self-pay

## 2020-03-11 NOTE — Patient Outreach (Signed)
[facility]) Care Management  03/11/2020  Sindi Beckworth [birth year] [id number]   Referral Date: 03/11/20 Referral Source: [employer] Report Date of Discharge: Mar 23, 2020 Facility: [facility]  Insurance: [facility]  Referral received.  Patient deceased.  No outreach warranted at this time.    Plan: RN CM will close case.    Bary Leriche, RN, MSN [facility] Care Management Care Management Coordinator Direct Line [phone] Toll Free: [phone]  Fax: [phone]

## 2020-03-13 ENCOUNTER — Telehealth: Payer: Self-pay | Admitting: Osteopathic Medicine

## 2020-03-13 NOTE — Telephone Encounter (Signed)
Caregiver wanted [place] to know she passed away

## 2020-03-13 NOTE — Telephone Encounter (Signed)
Task completed. Per Pharmacist Lahoma Rocker, pt's home nurse called and informed the pharmacy of patient's passing. All current medications have been discontinued.

## 2020-03-13 NOTE — Telephone Encounter (Signed)
Oh no! Thanks for letting me know. Routing to [place] as [employer].    Also, since this patient was on multiple controlled substances, please let's call the pharmacy to make sure we cancel these so that no one else can fill these prescriptions.Marland KitchenMarland Kitchen

## 2020-03-15 ENCOUNTER — Other Ambulatory Visit: Payer: Self-pay | Admitting: Osteopathic Medicine

## 2020-03-17 ENCOUNTER — Telehealth: Payer: Medicare HMO | Admitting: Osteopathic Medicine

## 2020-03-24 DEATH — deceased

## 2021-02-03 IMAGING — US US EXTREM LOW VENOUS*L*
1 series · 12 of 24 positions shown · non-contrast
Comparison: None.
COMPARISON: None.

Addendum:
CLINICAL DATA: 77-year-old female with a history of DVT in May 2018



[Series 1: us extrem low venous*left* · 0.06mm/px · 12 of 60 slices shown]
[im 3/60]
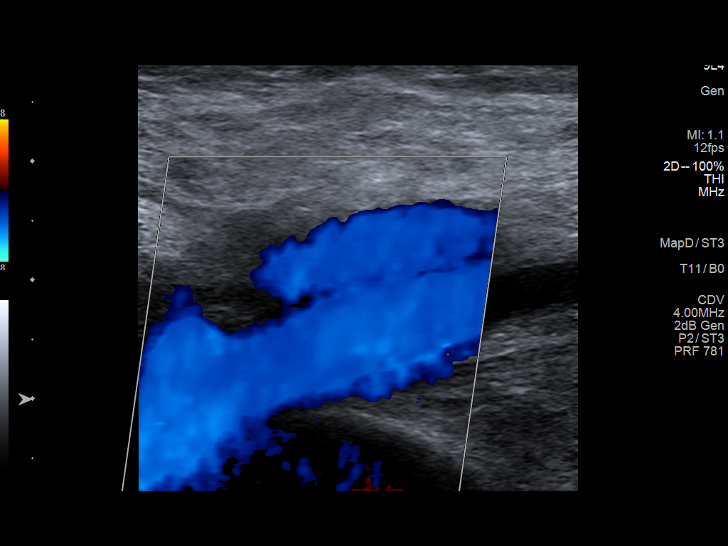
[im 8/60]
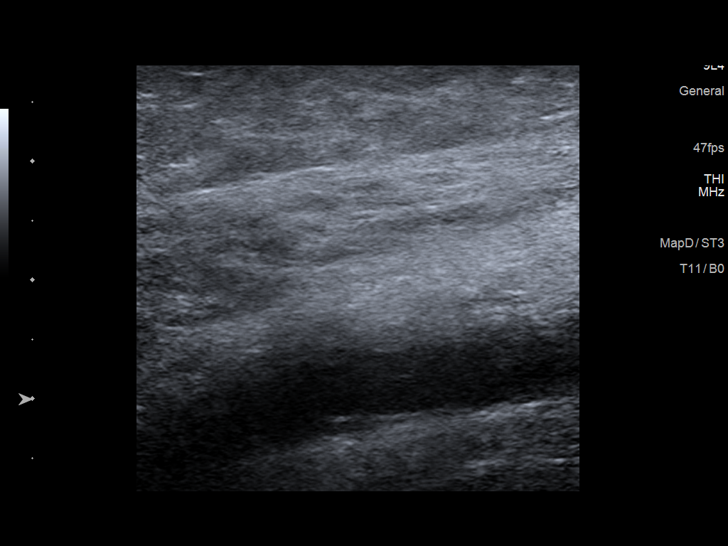
[im 13/60]
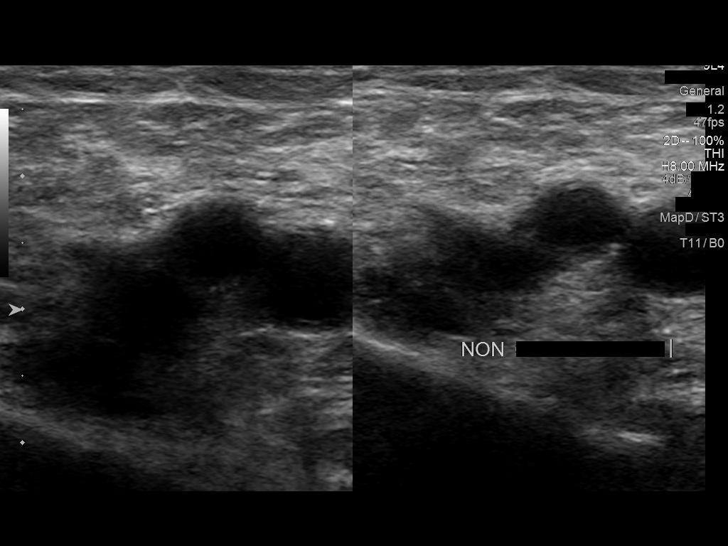
[im 18/60]
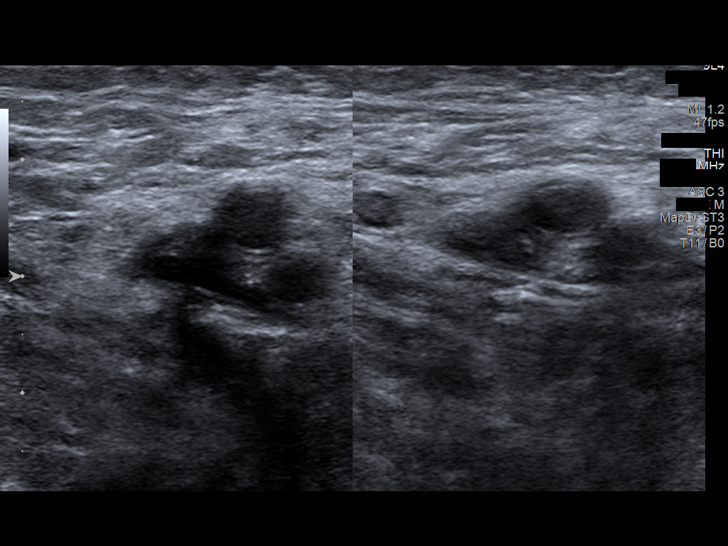
[im 24/60]
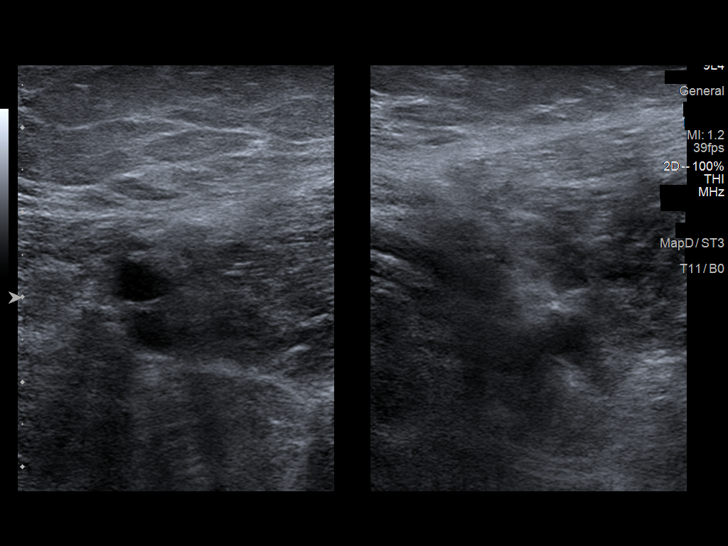
[im 29/60]
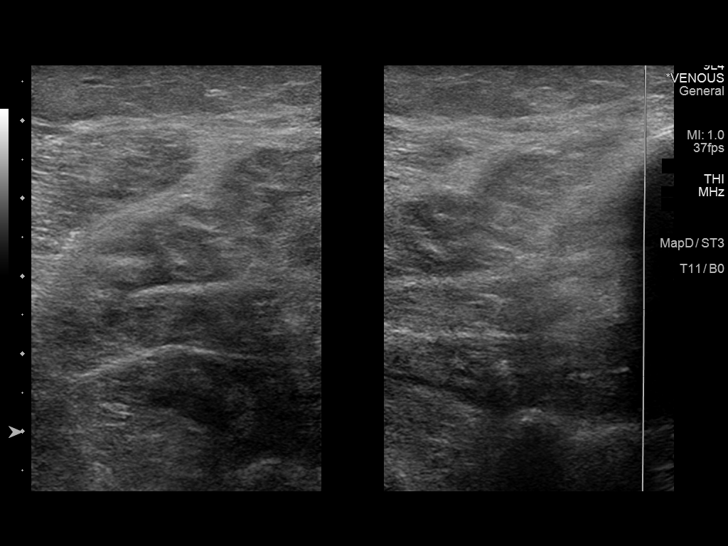
[im 34/60]
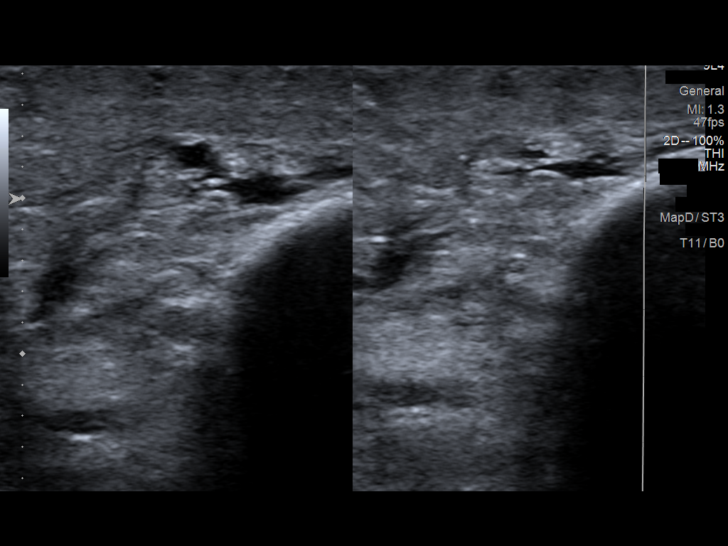
[im 39/60]
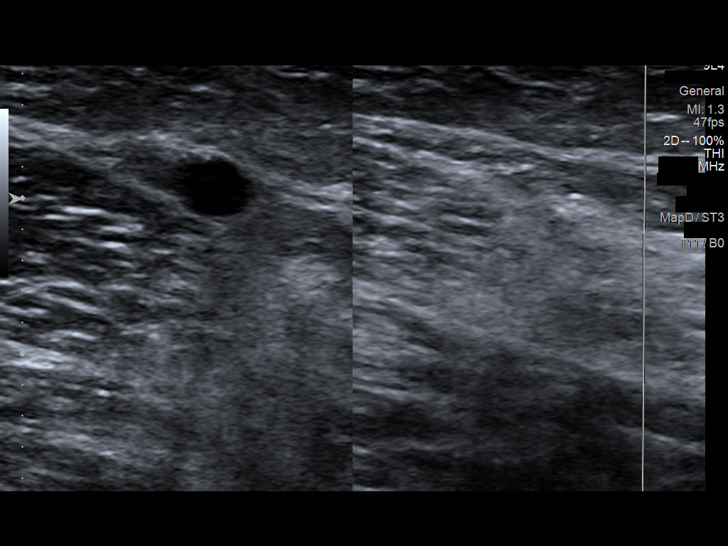
[im 44/60]
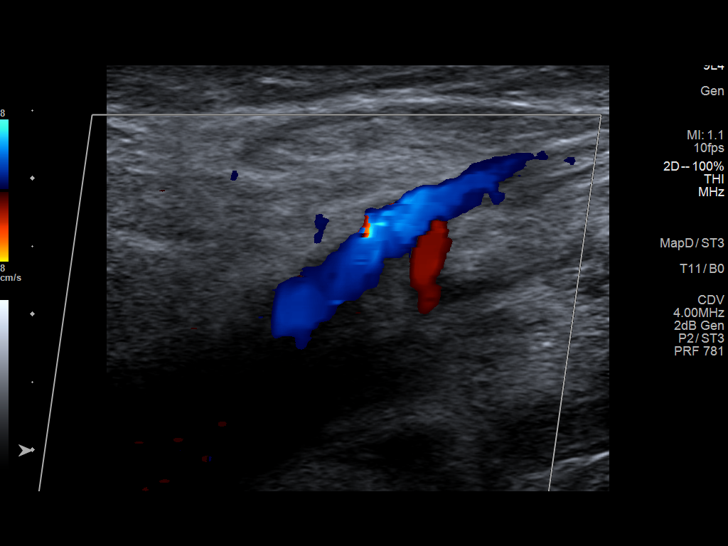
[im 49/60]
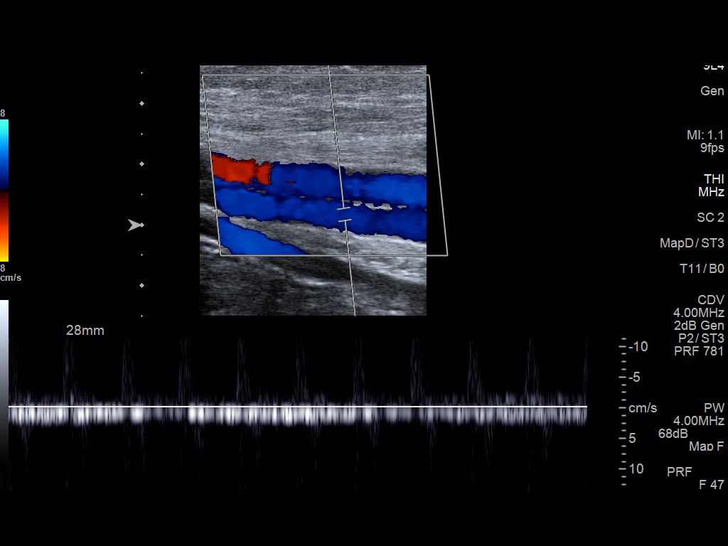
[im 54/60]
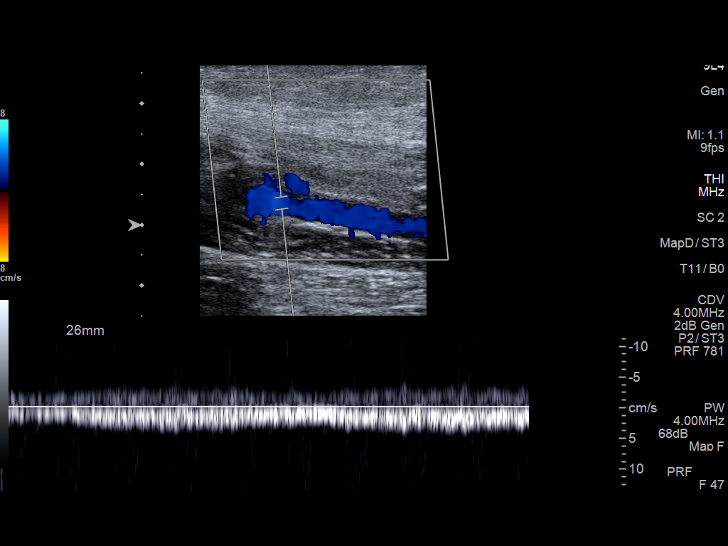
[im 60/60]
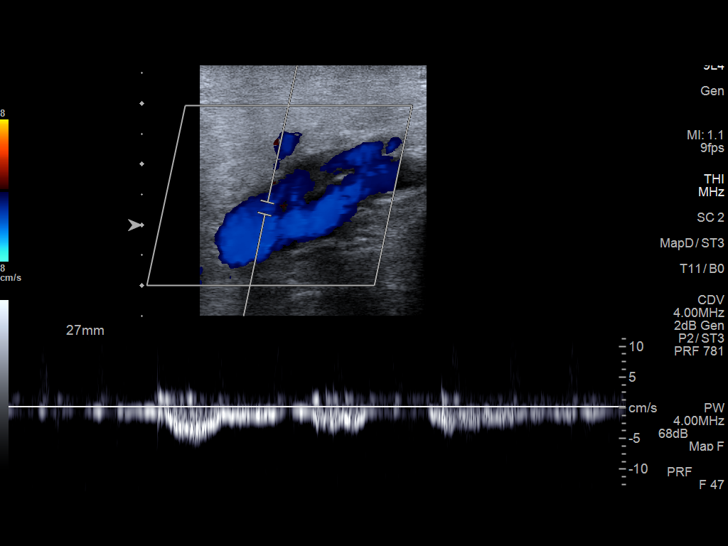

[12 of 24 positions shown; findings below may reference images not displayed]

FINDINGS: Contralateral Common Femoral Vein: Respiratory phasicity is normal
and symmetric with the symptomatic side. No evidence of thrombus.
Normal compressibility.

Common Femoral Vein: The common femoral vein is only partially
compressible. There is eccentric wall thickening consistent with the
residua of prior DVT. This is nonocclusive. There is evidence of
color flow on color Doppler imaging.

Saphenofemoral Junction: Chronic eccentric wall thickening extends
into the saphenofemoral junction. No evidence of occlusion.

Profunda Femoral Vein: Chronic eccentric wall thickening extends
into the proximal profunda femoral vein which is partially
compressible. Positive color flow on color Doppler imaging.

Femoral Vein: Chronic eccentric wall thickening extends into the
proximal femoral vein which is partially compressible. Positive
color flow on color Doppler imaging.

Popliteal Vein: No evidence of thrombus. Normal compressibility,
respiratory phasicity and response to augmentation.

Calf Veins: No evidence of thrombus. Normal compressibility and flow
on color Doppler imaging.

Superficial Great Saphenous Vein: No evidence of thrombus. Normal
compressibility.

Venous Reflux:  None.

Other Findings:  None.
IMPRESSION: 1. Chronic and partially recanalized deep venous thrombus within the
right common femoral, proximal femoral and proximal profunda femoral
veins.
2. No convincing evidence of acute deep venous thrombus in the right
lower extremity.

ADDENDUM:
Correction, no evidence of deep venous thrombosis in the LEFT lower
extremity.

*** End of Addendum ***
FINDINGS: Contralateral Common Femoral Vein: Respiratory phasicity is normal
and symmetric with the symptomatic side. No evidence of thrombus.
Normal compressibility.

Common Femoral Vein: The common femoral vein is only partially
compressible. There is eccentric wall thickening consistent with the
residua of prior DVT. This is nonocclusive. There is evidence of
color flow on color Doppler imaging.

Saphenofemoral Junction: Chronic eccentric wall thickening extends
into the saphenofemoral junction. No evidence of occlusion.

Profunda Femoral Vein: Chronic eccentric wall thickening extends
into the proximal profunda femoral vein which is partially
compressible. Positive color flow on color Doppler imaging.

Femoral Vein: Chronic eccentric wall thickening extends into the
proximal femoral vein which is partially compressible. Positive
color flow on color Doppler imaging.

Popliteal Vein: No evidence of thrombus. Normal compressibility,
respiratory phasicity and response to augmentation.

Calf Veins: No evidence of thrombus. Normal compressibility and flow
on color Doppler imaging.

Superficial Great Saphenous Vein: No evidence of thrombus. Normal
compressibility.

Venous Reflux:  None.

Other Findings:  None.
IMPRESSION: 1. Chronic and partially recanalized deep venous thrombus within the
right common femoral, proximal femoral and proximal profunda femoral
veins.
2. No convincing evidence of acute deep venous thrombus in the right
lower extremity.
